# Patient Record
Sex: Female | Born: 1970
Health system: Southern US, Community
[De-identification: ages and names within clinical notes are randomized; demographics above are authoritative.]

## PROBLEM LIST (undated history)

## (undated) DIAGNOSIS — L409 Psoriasis, unspecified: Secondary | ICD-10-CM

## (undated) DIAGNOSIS — Z87440 Personal history of urinary (tract) infections: Secondary | ICD-10-CM

## (undated) DIAGNOSIS — T7840XA Allergy, unspecified, initial encounter: Secondary | ICD-10-CM

## (undated) DIAGNOSIS — E669 Obesity, unspecified: Secondary | ICD-10-CM

## (undated) DIAGNOSIS — B353 Tinea pedis: Secondary | ICD-10-CM

## (undated) DIAGNOSIS — J40 Bronchitis, not specified as acute or chronic: Secondary | ICD-10-CM

## (undated) DIAGNOSIS — B019 Varicella without complication: Secondary | ICD-10-CM

## (undated) DIAGNOSIS — F32A Depression, unspecified: Secondary | ICD-10-CM

## (undated) DIAGNOSIS — F419 Anxiety disorder, unspecified: Secondary | ICD-10-CM

## (undated) DIAGNOSIS — R87619 Unspecified abnormal cytological findings in specimens from cervix uteri: Secondary | ICD-10-CM

## (undated) DIAGNOSIS — I10 Essential (primary) hypertension: Secondary | ICD-10-CM

## (undated) DIAGNOSIS — R002 Palpitations: Secondary | ICD-10-CM

## (undated) DIAGNOSIS — M7712 Lateral epicondylitis, left elbow: Secondary | ICD-10-CM

## (undated) DIAGNOSIS — N920 Excessive and frequent menstruation with regular cycle: Secondary | ICD-10-CM

## (undated) DIAGNOSIS — E876 Hypokalemia: Secondary | ICD-10-CM

## (undated) DIAGNOSIS — K219 Gastro-esophageal reflux disease without esophagitis: Secondary | ICD-10-CM

## (undated) DIAGNOSIS — D219 Benign neoplasm of connective and other soft tissue, unspecified: Secondary | ICD-10-CM

## (undated) DIAGNOSIS — B029 Zoster without complications: Secondary | ICD-10-CM

## (undated) DIAGNOSIS — B1910 Unspecified viral hepatitis B without hepatic coma: Secondary | ICD-10-CM

## (undated) DIAGNOSIS — S46911A Strain of unspecified muscle, fascia and tendon at shoulder and upper arm level, right arm, initial encounter: Secondary | ICD-10-CM

## (undated) HISTORY — DX: Anxiety disorder, unspecified: F41.9

## (undated) HISTORY — DX: Unspecified viral hepatitis B without hepatic coma: B19.10

## (undated) HISTORY — DX: Varicella without complication: B01.9

## (undated) HISTORY — DX: Gastro-esophageal reflux disease without esophagitis: K21.9

## (undated) HISTORY — DX: Bronchitis, not specified as acute or chronic: J40

## (undated) HISTORY — DX: Unspecified abnormal cytological findings in specimens from cervix uteri: R87.619

## (undated) HISTORY — DX: Psoriasis, unspecified: L40.9

## (undated) HISTORY — DX: Tinea pedis: B35.3

## (undated) HISTORY — DX: Hypokalemia: E87.6

## (undated) HISTORY — PX: EYE SURGERY: SHX253

## (undated) HISTORY — DX: Allergy, unspecified, initial encounter: T78.40XA

## (undated) HISTORY — DX: Personal history of urinary (tract) infections: Z87.440

## (undated) HISTORY — DX: Obesity, unspecified: E66.9

## (undated) HISTORY — PX: INTRAUTERINE DEVICE (IUD) INSERTION: SHX5877

## (undated) HISTORY — DX: Essential (primary) hypertension: I10

## (undated) HISTORY — DX: Strain of unspecified muscle, fascia and tendon at shoulder and upper arm level, right arm, initial encounter: S46.911A

## (undated) HISTORY — DX: Benign neoplasm of connective and other soft tissue, unspecified: D21.9

## (undated) HISTORY — DX: Palpitations: R00.2

## (undated) HISTORY — DX: Excessive and frequent menstruation with regular cycle: N92.0

## (undated) HISTORY — DX: Lateral epicondylitis, left elbow: M77.12

---

## 2001-06-13 HISTORY — PX: DILATION AND CURETTAGE OF UTERUS: SHX78

## 2008-06-04 ENCOUNTER — Other Ambulatory Visit: Admission: RE | Admit: 2008-06-04 | Discharge: 2008-06-04 | Payer: Self-pay | Admitting: Obstetrics and Gynecology

## 2008-06-13 LAB — HM MAMMOGRAPHY

## 2008-06-20 ENCOUNTER — Encounter: Admission: RE | Admit: 2008-06-20 | Discharge: 2008-06-20 | Payer: Self-pay | Admitting: Obstetrics & Gynecology

## 2008-08-14 ENCOUNTER — Encounter: Payer: Self-pay | Admitting: Cardiovascular Disease

## 2008-08-14 ENCOUNTER — Ambulatory Visit: Payer: Self-pay | Admitting: Cardiovascular Disease

## 2008-08-14 DIAGNOSIS — I2699 Other pulmonary embolism without acute cor pulmonale: Secondary | ICD-10-CM | POA: Insufficient documentation

## 2008-08-14 DIAGNOSIS — R0789 Other chest pain: Secondary | ICD-10-CM | POA: Insufficient documentation

## 2008-08-14 DIAGNOSIS — K219 Gastro-esophageal reflux disease without esophagitis: Secondary | ICD-10-CM | POA: Insufficient documentation

## 2009-12-12 ENCOUNTER — Other Ambulatory Visit: Payer: Self-pay | Admitting: Emergency Medicine

## 2009-12-13 ENCOUNTER — Emergency Department (HOSPITAL_COMMUNITY): Admission: EM | Admit: 2009-12-13 | Discharge: 2009-12-13 | Payer: Self-pay | Admitting: Emergency Medicine

## 2009-12-13 ENCOUNTER — Other Ambulatory Visit: Payer: Self-pay | Admitting: Emergency Medicine

## 2010-08-12 LAB — HM PAP SMEAR

## 2010-08-29 LAB — BASIC METABOLIC PANEL
BUN: 16 mg/dL (ref 6–23)
CO2: 24 mEq/L (ref 19–32)
Chloride: 110 mEq/L (ref 96–112)
Glucose, Bld: 95 mg/dL (ref 70–99)
Potassium: 3.5 mEq/L (ref 3.5–5.1)

## 2010-08-29 LAB — CBC
HCT: 40.4 % (ref 36.0–46.0)
MCH: 29.4 pg (ref 26.0–34.0)
MCHC: 33.6 g/dL (ref 30.0–36.0)
MCV: 87.6 fL (ref 78.0–100.0)
RDW: 12.3 % (ref 11.5–15.5)
WBC: 10.1 10*3/uL (ref 4.0–10.5)

## 2010-08-29 LAB — POCT CARDIAC MARKERS: CKMB, poc: <1 ng/mL — ABNORMAL LOW (ref 1.0–8.0)

## 2010-09-14 ENCOUNTER — Other Ambulatory Visit: Payer: Self-pay | Admitting: Obstetrics and Gynecology

## 2010-09-14 DIAGNOSIS — Z1231 Encounter for screening mammogram for malignant neoplasm of breast: Secondary | ICD-10-CM

## 2010-09-17 ENCOUNTER — Ambulatory Visit: Payer: Self-pay

## 2011-02-01 ENCOUNTER — Ambulatory Visit (INDEPENDENT_AMBULATORY_CARE_PROVIDER_SITE_OTHER): Payer: Self-pay | Admitting: Family Medicine

## 2011-02-01 ENCOUNTER — Encounter: Payer: Self-pay | Admitting: Family Medicine

## 2011-02-01 VITALS — BP 111/76 | HR 67 | Temp 98.4°F | Ht 62.0 in | Wt 213.8 lb

## 2011-02-01 DIAGNOSIS — R87619 Unspecified abnormal cytological findings in specimens from cervix uteri: Secondary | ICD-10-CM

## 2011-02-01 DIAGNOSIS — R82998 Other abnormal findings in urine: Secondary | ICD-10-CM

## 2011-02-01 DIAGNOSIS — L408 Other psoriasis: Secondary | ICD-10-CM

## 2011-02-01 DIAGNOSIS — L409 Psoriasis, unspecified: Secondary | ICD-10-CM

## 2011-02-01 DIAGNOSIS — Z87440 Personal history of urinary (tract) infections: Secondary | ICD-10-CM

## 2011-02-01 DIAGNOSIS — B182 Chronic viral hepatitis C: Secondary | ICD-10-CM

## 2011-02-01 DIAGNOSIS — I10 Essential (primary) hypertension: Secondary | ICD-10-CM

## 2011-02-01 DIAGNOSIS — R300 Dysuria: Secondary | ICD-10-CM

## 2011-02-01 DIAGNOSIS — K219 Gastro-esophageal reflux disease without esophagitis: Secondary | ICD-10-CM

## 2011-02-01 DIAGNOSIS — K759 Inflammatory liver disease, unspecified: Secondary | ICD-10-CM

## 2011-02-01 DIAGNOSIS — B181 Chronic viral hepatitis B without delta-agent: Secondary | ICD-10-CM

## 2011-02-01 LAB — POCT URINALYSIS DIPSTICK
Bilirubin, UA: NEGATIVE
Glucose, UA: NEGATIVE
Nitrite, UA: NEGATIVE
Spec Grav, UA: 1.015
Urobilinogen, UA: 0.2

## 2011-02-01 MED ORDER — NEBIVOLOL HCL 5 MG PO TABS: 5.0000 mg | ORAL_TABLET | Freq: Every day | ORAL | Status: DC

## 2011-02-01 MED ORDER — HALOBETASOL PROPIONATE 0.05 % EX OINT: TOPICAL_OINTMENT | Freq: Every day | CUTANEOUS | Status: DC | PRN

## 2011-02-01 NOTE — Patient Instructions (Signed)

## 2011-02-02 LAB — HEPATITIS PANEL, ACUTE: Hepatitis B Surface Ag: POSITIVE — AB

## 2011-02-04 MED ORDER — AMOXICILLIN-POT CLAVULANATE 875-125 MG PO TABS: 1.0000 | ORAL_TABLET | Freq: Two times a day (BID) | ORAL | Status: DC

## 2011-02-07 ENCOUNTER — Encounter: Payer: Self-pay | Admitting: Family Medicine

## 2011-02-07 DIAGNOSIS — I10 Essential (primary) hypertension: Secondary | ICD-10-CM | POA: Insufficient documentation

## 2011-02-07 DIAGNOSIS — Z87440 Personal history of urinary (tract) infections: Secondary | ICD-10-CM

## 2011-02-07 DIAGNOSIS — R87619 Unspecified abnormal cytological findings in specimens from cervix uteri: Secondary | ICD-10-CM | POA: Insufficient documentation

## 2011-02-07 DIAGNOSIS — B182 Chronic viral hepatitis C: Secondary | ICD-10-CM | POA: Insufficient documentation

## 2011-02-07 DIAGNOSIS — B181 Chronic viral hepatitis B without delta-agent: Secondary | ICD-10-CM

## 2011-02-07 HISTORY — DX: Unspecified abnormal cytological findings in specimens from cervix uteri: R87.619

## 2011-02-07 HISTORY — DX: Personal history of urinary (tract) infections: Z87.440

## 2011-02-07 HISTORY — DX: Chronic viral hepatitis B without delta-agent: B18.1

## 2011-02-07 NOTE — Assessment & Plan Note (Addendum)
First 9 roughly 3 months ago. Has been having some burning off and on since then. Will monitor with intermittent urinalysis and the based on symptoms. Urine culture shows strep infection, treated with Augmentin

## 2011-02-07 NOTE — Assessment & Plan Note (Signed)
No significant symptoms at this time, avoid offending foods and may use Ranitidine or Tums prn

## 2011-02-07 NOTE — Assessment & Plan Note (Signed)
Patient will maintain annual paps for now. Patient notes stopped Nuvaring in March 2012

## 2011-02-07 NOTE — Assessment & Plan Note (Signed)
Treat with HCTZ and Bystolic

## 2011-02-07 NOTE — Progress Notes (Signed)
Brittany Johnson [id number] [birth year] 02/07/2011      Progress Note New Patient  Subjective  Chief Complaint  Chief Complaint  Patient presents with  . Establish Care    new patient  . Urinary Tract Infection    UTI 3 months ago and has burning every now and then with urination    HPI  , Caucasian female who is in to establish care. She notes about 3 months ago she had her first urinary tract infection and since then has been having trouble with intermittent burning and frequency. Notes recent difficulties with some burning have recurred. Denies fevers, chills, abdominal pain, back pain, chest pain, palpitations, shortness of breath, headache, congestion, GI complaints. She believe she is a hepatitis B carrier but is unclear which type of hepatitis. She notes she was treated while she was pregnant with her 2 children. She is G4 P2 status post 2 spontaneous vaginal deliveries one miscarriage and a blighted ova. Reports a history of hypertension and some renal insufficiency in the past but notes these have improved. Stopped Nuvering in March 2012. He saw Dr. Leota Sauers for her GYN care and as noted distant history of an abnormal Pap. Lives with her husband and 2 young children.  Past Medical History  Diagnosis Date  . Hypertension   . Chicken pox as a child  . Hepatitis C carrier 02/07/2011  . Hx: UTI (urinary tract infection) 02/07/2011  . Abnormal cervical cytology 02/07/2011  . HTN (hypertension) 02/07/2011    Past Surgical History  Procedure Date  . Dilation and curettage of uterus 2003    Family History  Problem Relation Age of Onset  . Diabetes Mother     type 2  . Heart attack Father   . Hypertension Father   . Cancer Paternal Grandmother     ?    History   Social History  . Marital Status: Married    Spouse Name: N/A    Number of Children: N/A  . Years of Education: N/A   Occupational History  . Not on file.   Social History Main Topics  . Smoking  status: Never Smoker   . Smokeless tobacco: Never Used  . Alcohol Use: No     occasionaly- once every 4 months  . Drug Use: No  . Sexually Active: Yes -- Female partner(s)   Other Topics Concern  . Not on file   Social History Narrative  . No narrative on file    No current outpatient prescriptions on file prior to visit.    No Known Allergies  Review of Systems  Review of Systems  Constitutional: Negative for fever, chills and malaise/fatigue.  HENT: Negative for hearing loss, nosebleeds and congestion.   Eyes: Negative for discharge.  Respiratory: Negative for cough, sputum production, shortness of breath and wheezing.   Cardiovascular: Negative for chest pain, palpitations and leg swelling.  Gastrointestinal: Negative for heartburn, nausea, vomiting, abdominal pain, diarrhea, constipation and blood in stool.  Genitourinary: Positive for dysuria and frequency. Negative for urgency, hematuria and flank pain.  Musculoskeletal: Negative for myalgias, back pain and falls.  Skin: Negative for rash.  Neurological: Negative for dizziness, tremors, sensory change, focal weakness, loss of consciousness, weakness and headaches.  Endo/Heme/Allergies: Negative for polydipsia. Does not bruise/bleed easily.  Psychiatric/Behavioral: Negative for depression and suicidal ideas. The patient is not nervous/anxious and does not have insomnia.     Objective  BP 111/76  Pulse 67  Temp(Src) 98.4 F (36.9 C) (Oral)  Ht 5\' 2"  (1.575 m)  Wt 213 lb 12.8 oz (96.979 kg)  BMI 39.10 kg/m2  SpO2 97%  LMP 01/18/2011  Physical Exam  Physical Exam  Constitutional: She is oriented to person, place, and time and well-developed, well-nourished, and in no distress. No distress.  HENT:  Head: Normocephalic and atraumatic.  Right Ear: External ear normal.  Left Ear: External ear normal.  Nose: Nose normal.  Mouth/Throat: Oropharynx is clear and moist. No oropharyngeal exudate.  Eyes: Conjunctivae  are normal. Pupils are equal, round, and reactive to light. Right eye exhibits no discharge. Left eye exhibits no discharge. No scleral icterus.  Neck: Normal range of motion. Neck supple. No thyromegaly present.  Cardiovascular: Normal rate, regular rhythm, normal heart sounds and intact distal pulses.   No murmur heard. Pulmonary/Chest: Effort normal and breath sounds normal. No respiratory distress. She has no wheezes. She has no rales.  Abdominal: Soft. Bowel sounds are normal. She exhibits no distension and no mass. There is no tenderness.  Musculoskeletal: Normal range of motion. She exhibits no edema and no tenderness.  Lymphadenopathy:    She has no cervical adenopathy.  Neurological: She is alert and oriented to person, place, and time. She has normal reflexes. No cranial nerve deficit. Coordination normal.  Skin: Skin is warm and dry. No rash noted. She is not diaphoretic.  Psychiatric: Mood, memory and affect normal.       Assessment & Plan  Hepatitis B carrier Patient unclear which Hepatitis she was the carrier of labs done at visit confirm Hep B carrier, will monitor  Hx: UTI (urinary tract infection) First 9 roughly 3 months ago. Has been having some burning off and on since then. Will monitor with intermittent urinalysis and the based on symptoms. Urine culture shows strep infection, treated with Augmentin  GERD No significant symptoms at this time, avoid offending foods and may use Ranitidine or Tums prn  Abnormal cervical cytology Patient will maintain annual paps for now. Patient notes stopped Nuvaring in March 2012  HTN (hypertension) Treat with HCTZ and Bystolic

## 2011-02-07 NOTE — Assessment & Plan Note (Signed)
Patient unclear which Hepatitis she was the carrier of labs done at visit confirm Hep B carrier, will monitor

## 2011-02-25 ENCOUNTER — Encounter: Payer: Self-pay | Admitting: Family Medicine

## 2011-02-25 ENCOUNTER — Ambulatory Visit: Payer: Self-pay

## 2011-02-25 ENCOUNTER — Ambulatory Visit (INDEPENDENT_AMBULATORY_CARE_PROVIDER_SITE_OTHER): Payer: BLUE CROSS/BLUE SHIELD | Admitting: Family Medicine

## 2011-02-25 DIAGNOSIS — T2210XA Burn of first degree of shoulder and upper limb, except wrist and hand, unspecified site, initial encounter: Secondary | ICD-10-CM

## 2011-02-25 DIAGNOSIS — I10 Essential (primary) hypertension: Secondary | ICD-10-CM

## 2011-02-25 NOTE — Patient Instructions (Signed)
Burn Care Instructions Your skin is a natural barrier to infection. It is the largest organ of your body. Because of your burn, this natural protection has been damaged. To help prevent infection, it is very important to follow these instructions in the care of your burn. BURNS ARE CLASSIFIED AS:  First degree - only erythema or redness of skin. No scarring expected.   Second degree - blistering of skin. No scarring expected.   Third degree - destruction of all layers of skin, scarring expected. Depending on size it may require grafting.  HOME CARE INSTRUCTIONS  Wash hands well before changing your bandage.   Change your bandage  times per day or as instructed by your caregiver.   Remove old bandage. (If bandage sticks to burn you may soak it off with cool, clean water).   Cleanse thoroughly but gently with mild soap and water.   Pat dry with a clean, dry cloth.   Apply a thin layer of anti-bacterial (germ) cream to the burn.   Apply clean bandages as shown to you in the Emergency Department or by your caregiver.   Keep dressing as clean and dry as possible.   Elevate affected area (such as hand or foot) for the first 24 hours, then as instructed by caregiver.   Only take over-the-counter or prescription medicines for pain, discomfort, or fever as directed by your caregiver.  SEEK IMMEDIATE MEDICAL CARE IF:  You develop excessive pain.   The burned area develops redness, tenderness, swelling, or red streaks near the burn.   The burned area develops pus or a foul odor.   You develop an oral temperature above 103 MAKE SURE YOU:   Understand these instructions.   Will watch your condition.   Will get help right away if you are not doing well or get worse.  Document Released: 05/30/2005 Document Re-Released: 11/17/2009 [facility] Patient Information 2011 [place], [place].  Consider Bacitracin ointment for any burns infuture, apply at bed for next 2 days and then as  needed  Use [facility] Astringent as needed for any skin irritation, acne, bug bites etc

## 2011-02-28 DIAGNOSIS — T2210XA Burn of first degree of shoulder and upper limb, except wrist and hand, unspecified site, initial encounter: Secondary | ICD-10-CM | POA: Insufficient documentation

## 2011-02-28 NOTE — Assessment & Plan Note (Signed)
Patient noted some redness, swelling and discomfort around wound and in left axillae last night but all symptoms are improved today.  No f/c/malaise. Encouraged her to continue cleansing it daily with warm soap and watewr daily and may cover with bacitracin prn if redness or swelling return

## 2011-02-28 NOTE — Progress Notes (Signed)
Brittany Johnson [id number] January 18, 1971 02/28/2011      Progress Note-Follow Up  Subjective  Chief Complaint  Chief Complaint  Patient presents with  . Burn    left arm X 6 days    HPI  Patient is a 40 year old Caucasian female who is here today to have a burn on her left arm evaluated. She actually burned it several days ago and it was improving until last night when he became somewhat red and inflamed around the site. Took some mild increasing tenderness and discomfort in that axilla as well. No blistering of skin or sloughing appeared. No f/c/malaise and today it feels better, she has just been cleaning it daily with warm water and mild soap. Has tried some Neosporin prn. No other c/o today. No CP/palp/SPB/GI or GU c/o.  Past Medical History  Diagnosis Date  . Hypertension   . Chicken pox as a child  . Hepatitis C carrier 02/07/2011  . Hx: UTI (urinary tract infection) 02/07/2011  . Abnormal cervical cytology 02/07/2011  . HTN (hypertension) 02/07/2011    Past Surgical History  Procedure Date  . Dilation and curettage of uterus 2003    Family History  Problem Relation Age of Onset  . Diabetes Mother     type 2  . Heart attack Father   . Hypertension Father   . Cancer Paternal Grandmother     ?    History   Social History  . Marital Status: Married    Spouse Name: N/A    Number of Children: N/A  . Years of Education: N/A   Occupational History  . Not on file.   Social History Main Topics  . Smoking status: Never Smoker   . Smokeless tobacco: Never Used  . Alcohol Use: No     occasionaly- once every 4 months  . Drug Use: No  . Sexually Active: Yes -- Female partner(s)   Other Topics Concern  . Not on file   Social History Narrative  . No narrative on file    Current Outpatient Prescriptions on File Prior to Visit  Medication Sig Dispense Refill  . halobetasol (ULTRAVATE) 0.05 % ointment Apply topically daily as needed. rash  50 g  0  .  hydrochlorothiazide (,MICROZIDE/HYDRODIURIL,) 12.5 MG capsule Take 12.5 mg by mouth daily.        . nebivolol (BYSTOLIC) 5 MG tablet Take 1 tablet (5 mg total) by mouth daily.  35 tablet  0    No Known Allergies  Review of Systems  Review of Systems  Constitutional: Negative for fever and malaise/fatigue.  HENT: Negative for congestion.   Eyes: Negative for discharge.  Respiratory: Negative for shortness of breath.   Cardiovascular: Negative for chest pain, palpitations and leg swelling.  Gastrointestinal: Negative for nausea, abdominal pain and diarrhea.  Genitourinary: Negative for dysuria.  Musculoskeletal: Negative for falls.  Skin: Negative for rash.       Small burn LUE, no blistering  Neurological: Negative for loss of consciousness and headaches.  Endo/Heme/Allergies: Negative for polydipsia.  Psychiatric/Behavioral: Negative for depression and suicidal ideas. The patient is not nervous/anxious and does not have insomnia.     Objective  BP 121/77  Pulse 75  Temp(Src) 98.1 F (36.7 C) (Oral)  Ht 5\' 2"  (1.575 m)  Wt 214 lb 12.8 oz (97.433 kg)  BMI 39.29 kg/m2  SpO2 99%  LMP 02/18/2011  Physical Exam  Physical Exam  Constitutional: She is oriented to person, place, and time and well-developed, well-nourished,  and in no distress. No distress.  HENT:  Head: Normocephalic and atraumatic.  Eyes: Conjunctivae are normal.  Neck: Neck supple. No thyromegaly present.  Cardiovascular: Normal rate, regular rhythm and normal heart sounds.   No murmur heard. Pulmonary/Chest: Effort normal and breath sounds normal. She has no wheezes.  Abdominal: She exhibits no distension and no mass.  Musculoskeletal: She exhibits no edema.  Lymphadenopathy:    She has no cervical adenopathy.  Neurological: She is alert and oriented to person, place, and time.  Skin: Skin is warm and dry. No rash noted. She is not diaphoretic.       3/4 in x 2 in, burn, mildly erythematous, no  surrounding fluctuance, nontender, no pus or drainage on LUE near wrist  Psychiatric: Memory, affect and judgment normal.    Lab Results  Component Value Date   TSH 1.78 08/14/2008   Lab Results  Component Value Date   WBC 10.1 12/12/2009   HGB 13.6 12/12/2009   HCT 40.4 12/12/2009   MCV 87.6 12/12/2009   PLT 223 12/12/2009   Lab Results  Component Value Date   CREATININE 1.0 12/12/2009   BUN 16 12/12/2009   NA 143 12/12/2009   K 3.5 12/12/2009   CL 110 12/12/2009   CO2 24 12/12/2009      Assessment & Plan  Burn of arm, first degree Patient noted some redness, swelling and discomfort around wound and in left axillae last night but all symptoms are improved today.  No f/c/malaise. Encouraged her to continue cleansing it daily with warm soap and watewr daily and may cover with bacitracin prn if redness or swelling return  HTN (hypertension) Well controlled at today's visit, no changes

## 2011-02-28 NOTE — Assessment & Plan Note (Signed)
Well controlled at today's visit, no changes 

## 2011-03-04 ENCOUNTER — Encounter: Payer: Self-pay | Admitting: Family Medicine

## 2011-03-04 ENCOUNTER — Ambulatory Visit (INDEPENDENT_AMBULATORY_CARE_PROVIDER_SITE_OTHER): Payer: BLUE CROSS/BLUE SHIELD | Admitting: Family Medicine

## 2011-03-04 VITALS — BP 116/74 | HR 66 | Temp 98.7°F | Ht 62.0 in | Wt 214.0 lb

## 2011-03-04 DIAGNOSIS — B353 Tinea pedis: Secondary | ICD-10-CM

## 2011-03-04 DIAGNOSIS — Z23 Encounter for immunization: Secondary | ICD-10-CM

## 2011-03-04 DIAGNOSIS — T2210XA Burn of first degree of shoulder and upper limb, except wrist and hand, unspecified site, initial encounter: Secondary | ICD-10-CM

## 2011-03-04 DIAGNOSIS — E669 Obesity, unspecified: Secondary | ICD-10-CM

## 2011-03-04 DIAGNOSIS — K219 Gastro-esophageal reflux disease without esophagitis: Secondary | ICD-10-CM

## 2011-03-04 DIAGNOSIS — I10 Essential (primary) hypertension: Secondary | ICD-10-CM

## 2011-03-04 HISTORY — DX: Obesity, unspecified: E66.9

## 2011-03-04 MED ORDER — HYDROCHLOROTHIAZIDE 12.5 MG PO CAPS: 12.5000 mg | ORAL_CAPSULE | Freq: Every day | ORAL | Status: DC

## 2011-03-04 NOTE — Assessment & Plan Note (Signed)
Resolved

## 2011-03-04 NOTE — Assessment & Plan Note (Signed)
Patient would like to try weaning off bp meds, she forgot to take them several days and her numbers were good. We will stop bystolic and she will monitor bp, if running hi restart, if good she may also put aside hctz a week before her next visit so we can check bp off both. She has the DASH diet outline already to follow.

## 2011-03-04 NOTE — Assessment & Plan Note (Signed)
No c/o

## 2011-03-04 NOTE — Patient Instructions (Addendum)
Tinea Pedis (Athlete's Foot) Your exam shows you have athlete's foot, a fungus infection that usually starts between the toes. This condition is made worse by heat, moisture, and friction. To treat it you should wash your feet 2-3 times daily, drying well especially between the toes. Wear cotton socks to absorb sweat and change them twice a day if possible. You should allow your feet to be open to the air as much as possible and wear sandals when possible. The first step in treatment is prevention:  Do not share towels.   Wear sandals or other foot protection in wet areas such as locker rooms, shared showers and public swimming pools.   Keep your feet dry. Wear shoes that allow air to circulate and use cotton or wool socks.  Many products are available to treat athlete's foot. Most are medications that kill fungus and are available as powders or creams. Some require a prescription, some do not. Your caregiver or pharmacist can make a suggestion. Do not use steroid creams on athlete's foot. SEEK MEDICAL CARE IF:  You develop a temperature with no other apparent cause.   You develop swelling and soreness & redness (inflammation) in your foot.   The infection is spreading. Your foot becomes swollen, hot and red (inflamed).   Treatment is not helping.   Athlete's foot is not better in 7 days or completely cured in 30 days.   You have any problems that may be related to the medicine you are taking.  Document Released: 07/07/2004 Document Re-Released: 05/18/2009 [facility] Patient Information 2011 [place], [place].   Apply Tinactin spray to shoes, try powder to socks and use Lamisil (Terbenafine) gel or cream to foot twice daily.  Stop Bystolic and monitor bp and pulse, want bp < 135/90 at rest and pulse < 90 if numbers run hi, restart Bystolic and call. If bp is doing good and you want to can stop HCTZ 1 week prior to bp check and we will reassess at visit

## 2011-03-04 NOTE — Assessment & Plan Note (Signed)
Given DASH diet and has made an appt at a Bariatric Clinic, increase exercise and check TSH level with labs

## 2011-03-04 NOTE — Progress Notes (Signed)
Brittany Johnson [id number] 29-Jun-1970 03/04/2011      Progress Note-Follow Up  Subjective  Chief Complaint  Chief Complaint  Patient presents with  . Hypertension    1 month follow up  . Ankle Pain    twisted ankle a couple of days ago, swollen  . Eczema    on right foot    HPI  Patient is a 40 year old Caucasian female who is in today for her followup of hypertension. Recently a flu shot today. At her last Tdap in 2008. Recently twisted her left ankle but the pain and swelling are nearly resolved. She is complaining of thickened, dry, flaky skin on the sole of her right foot but not on the foot. No recent illness, fevers, chills, headache, chest pain, palpitations, shortness of breath, GI or GU concerns noted at today's visit  Past Medical History  Diagnosis Date  . Hypertension   . Chicken pox as a child  . Hepatitis C carrier 02/07/2011  . Hx: UTI (urinary tract infection) 02/07/2011  . Abnormal cervical cytology 02/07/2011  . HTN (hypertension) 02/07/2011  . Obese 03/04/2011    Past Surgical History  Procedure Date  . Dilation and curettage of uterus 2003    Family History  Problem Relation Age of Onset  . Diabetes Mother     type 2  . Heart attack Father   . Hypertension Father   . Cancer Paternal Grandmother     ?    History   Social History  . Marital Status: Married    Spouse Name: N/A    Number of Children: N/A  . Years of Education: N/A   Occupational History  . Not on file.   Social History Main Topics  . Smoking status: Never Smoker   . Smokeless tobacco: Never Used  . Alcohol Use: No     occasionaly- once every 4 months  . Drug Use: No  . Sexually Active: Yes -- Female partner(s)   Other Topics Concern  . Not on file   Social History Narrative  . No narrative on file    No current outpatient prescriptions on file prior to visit.    No Known Allergies  Review of Systems  Review of Systems  Constitutional: Negative for fever and  malaise/fatigue.  HENT: Negative for congestion.   Eyes: Negative for discharge.  Respiratory: Negative for shortness of breath.   Cardiovascular: Negative for chest pain, palpitations and leg swelling.  Gastrointestinal: Negative for nausea, abdominal pain and diarrhea.  Genitourinary: Negative for dysuria.  Musculoskeletal: Positive for joint pain. Negative for falls.       Turned left ankle this week but pain and swelling are nearly resolved.   Skin: Negative for rash.       Dry, flaky, thickened skin on base of right foot.  Neurological: Negative for loss of consciousness and headaches.  Endo/Heme/Allergies: Negative for polydipsia.  Psychiatric/Behavioral: Negative for depression and suicidal ideas. The patient is not nervous/anxious and does not have insomnia.     Objective  BP 116/74  Pulse 66  Temp(Src) 98.7 F (37.1 C) (Oral)  Ht 5\' 2"  (1.575 m)  Wt 214 lb (97.07 kg)  BMI 39.14 kg/m2  SpO2 98%  LMP 02/18/2011  Physical Exam  Physical Exam  Constitutional: She is oriented to person, place, and time and well-developed, well-nourished, and in no distress. No distress.  HENT:  Head: Normocephalic and atraumatic.  Eyes: Conjunctivae are normal.  Neck: Neck supple. No thyromegaly present.  Cardiovascular:  Normal rate, regular rhythm and normal heart sounds.   No murmur heard. Pulmonary/Chest: Effort normal and breath sounds normal. She has no wheezes.  Abdominal: She exhibits no distension and no mass.  Musculoskeletal: She exhibits no edema.  Lymphadenopathy:    She has no cervical adenopathy.  Neurological: She is alert and oriented to person, place, and time.  Skin: Skin is warm and dry. No rash noted. She is not diaphoretic.       Skin on sole of right foot, thick, flaky, white, cracked  Psychiatric: Memory, affect and judgment normal.    Lab Results  Component Value Date   TSH 1.78 08/14/2008   Lab Results  Component Value Date   WBC 10.1 12/12/2009   HGB  13.6 12/12/2009   HCT 40.4 12/12/2009   MCV 87.6 12/12/2009   PLT 223 12/12/2009   Lab Results  Component Value Date   CREATININE 1.0 12/12/2009   BUN 16 12/12/2009   NA 143 12/12/2009   K 3.5 12/12/2009   CL 110 12/12/2009   CO2 24 12/12/2009      Assessment & Plan  HTN (hypertension) Patient would like to try weaning off bp meds, she forgot to take them several days and her numbers were good. We will stop bystolic and she will monitor bp, if running hi restart, if good she may also put aside hctz a week before her next visit so we can check bp off both. She has the DASH diet outline already to follow.  Obese Given DASH diet and has made an appt at a Bariatric Clinic, increase exercise and check TSH level with labs  Burn of arm, first degree Resolved  GERD No c/o.  Tinea pedis of right foot Lamisil bid, athelete's foot powder to all socks, spray to all shoes and debride thickened skin

## 2011-03-07 ENCOUNTER — Encounter: Payer: Self-pay | Admitting: Family Medicine

## 2011-03-07 DIAGNOSIS — B353 Tinea pedis: Secondary | ICD-10-CM | POA: Insufficient documentation

## 2011-03-07 HISTORY — DX: Tinea pedis: B35.3

## 2011-03-07 NOTE — Assessment & Plan Note (Signed)
Lamisil bid, athelete's foot powder to all socks, spray to all shoes and debride thickened skin

## 2011-03-25 ENCOUNTER — Inpatient Hospital Stay: Admission: RE | Admit: 2011-03-25 | Payer: Self-pay | Source: Ambulatory Visit

## 2011-04-15 ENCOUNTER — Ambulatory Visit
Admission: RE | Admit: 2011-04-15 | Discharge: 2011-04-15 | Disposition: A | Discharge status: Home / Self Care | Payer: BC Managed Care – PPO | Source: Ambulatory Visit | Attending: Obstetrics and Gynecology | Admitting: Obstetrics and Gynecology

## 2011-04-15 ENCOUNTER — Ambulatory Visit: Payer: Self-pay

## 2011-04-15 DIAGNOSIS — Z1231 Encounter for screening mammogram for malignant neoplasm of breast: Secondary | ICD-10-CM

## 2011-04-22 ENCOUNTER — Ambulatory Visit: Payer: Self-pay

## 2011-05-06 ENCOUNTER — Ambulatory Visit: Payer: BLUE CROSS/BLUE SHIELD | Admitting: Family Medicine

## 2011-06-27 ENCOUNTER — Telehealth: Payer: Self-pay | Admitting: Family Medicine

## 2011-06-27 NOTE — Telephone Encounter (Signed)
Please advise Tamiflu request?

## 2011-06-27 NOTE — Telephone Encounter (Signed)
She can have it if she wants, does not help tremendously. Does have some potential SE, if she would like it is 75 mg po daily x 5 days, disp # 5

## 2011-06-27 NOTE — Telephone Encounter (Signed)
Patient's child has a confirmed case of the flu, he is on Tamiflu, patient would like to know if she can get an Rx?

## 2011-06-28 NOTE — Telephone Encounter (Signed)
Pt advised and states she is feeling ok and daughter is feeling better today so she is not wanting the tamiflu called in.

## 2011-08-05 ENCOUNTER — Other Ambulatory Visit: Payer: Self-pay | Admitting: Family Medicine

## 2011-08-29 ENCOUNTER — Ambulatory Visit: Payer: BC Managed Care – PPO | Admitting: Family Medicine

## 2011-09-02 ENCOUNTER — Encounter: Payer: Self-pay | Admitting: Family Medicine

## 2011-09-02 ENCOUNTER — Ambulatory Visit (INDEPENDENT_AMBULATORY_CARE_PROVIDER_SITE_OTHER): Payer: BC Managed Care – PPO | Admitting: Family Medicine

## 2011-09-02 VITALS — BP 128/83 | HR 86 | Temp 98.6°F | Ht 62.0 in | Wt 195.0 lb

## 2011-09-02 DIAGNOSIS — L0291 Cutaneous abscess, unspecified: Secondary | ICD-10-CM

## 2011-09-02 DIAGNOSIS — Z0000 Encounter for general adult medical examination without abnormal findings: Secondary | ICD-10-CM

## 2011-09-02 DIAGNOSIS — M25559 Pain in unspecified hip: Secondary | ICD-10-CM

## 2011-09-02 DIAGNOSIS — L409 Psoriasis, unspecified: Secondary | ICD-10-CM

## 2011-09-02 DIAGNOSIS — I10 Essential (primary) hypertension: Secondary | ICD-10-CM

## 2011-09-02 DIAGNOSIS — M25551 Pain in right hip: Secondary | ICD-10-CM

## 2011-09-02 DIAGNOSIS — L0390 Cellulitis, unspecified: Secondary | ICD-10-CM

## 2011-09-02 DIAGNOSIS — M7712 Lateral epicondylitis, left elbow: Secondary | ICD-10-CM

## 2011-09-02 DIAGNOSIS — M7710 Lateral epicondylitis, unspecified elbow: Secondary | ICD-10-CM

## 2011-09-02 DIAGNOSIS — L309 Dermatitis, unspecified: Secondary | ICD-10-CM | POA: Insufficient documentation

## 2011-09-02 DIAGNOSIS — L408 Other psoriasis: Secondary | ICD-10-CM

## 2011-09-02 DIAGNOSIS — IMO0002 Reserved for concepts with insufficient information to code with codable children: Secondary | ICD-10-CM

## 2011-09-02 HISTORY — DX: Psoriasis, unspecified: L40.9

## 2011-09-02 HISTORY — DX: Lateral epicondylitis, left elbow: M77.12

## 2011-09-02 LAB — RENAL FUNCTION PANEL
Albumin: 3.9 g/dL (ref 3.5–5.2)
BUN: 10 mg/dL (ref 6–23)
CO2: 27 mEq/L (ref 19–32)
Chloride: 101 mEq/L (ref 96–112)

## 2011-09-02 LAB — CBC
Hemoglobin: 14 g/dL (ref 12.0–15.0)
Platelets: 198 10*3/uL (ref 150.0–400.0)
WBC: 9.8 10*3/uL (ref 4.5–10.5)

## 2011-09-02 LAB — HEPATIC FUNCTION PANEL
AST: 20 U/L (ref 0–37)
Albumin: 3.9 g/dL (ref 3.5–5.2)
Alkaline Phosphatase: 54 U/L (ref 39–117)
Total Protein: 7 g/dL (ref 6.0–8.3)

## 2011-09-02 LAB — TSH: TSH: 2.34 u[IU]/mL (ref 0.35–5.50)

## 2011-09-02 LAB — SEDIMENTATION RATE: Sed Rate: 9 mm/hr (ref 0–22)

## 2011-09-02 MED ORDER — CLOBETASOL PROPIONATE 0.05 % EX CREA: TOPICAL_CREAM | Freq: Two times a day (BID) | CUTANEOUS | Status: AC

## 2011-09-02 MED ORDER — METHYLPREDNISOLONE 4 MG PO KIT: PACK | ORAL | Status: AC

## 2011-09-02 MED ORDER — HYDROCHLOROTHIAZIDE 12.5 MG PO CAPS: 12.5000 mg | ORAL_CAPSULE | ORAL | Status: DC

## 2011-09-02 MED ORDER — MUPIROCIN 2 % EX OINT: TOPICAL_OINTMENT | Freq: Three times a day (TID) | CUTANEOUS | Status: AC

## 2011-09-02 NOTE — Assessment & Plan Note (Signed)
Diagnosed years ago but generally does not cause her any trouble. Previously the responsible with her elbows and knees but at present she has a painful irritated red spot on herleft anterior tibial plateau. She's tried some steroid cream she has at home and unfortunately they have not been particularly helpful. we'll start a Medrol Dosepak and at the end of the week she may try some clobetasol cream as well. In the meantime while she is taking them Medrol Dosepak she will apply some mupirocin topically to treat possible early secondary cellulitis

## 2011-09-02 NOTE — Progress Notes (Signed)
Patient ID: Brittany Johnson, female   DOB: Mar 20, 1971, 41 y.o.   MRN: [id number] Brittany Johnson [id number] 03-Apr-1971 09/02/2011      Progress Note-Follow Up  Subjective  Chief Complaint  Chief Complaint  Patient presents with  . Elbow Pain    left X 4 months  . Hypertension    follow up meds  . Psoriasis    cream not working    HPI  Patient is a 41 year old Caucasian female who is in today for several reasons. She had a couple of her left elbow for several months. He first injured kickboxing and has injured it intermittently since then. It swells at times and ice is helpful. Sometimes wakes her up it's painful enough. Does radiate slightly down and up the arm it probably to the hand or shoulder. He denies any major trauma or falls. Denies any similar history of recurrent symptoms. She is also complaining of recurrence of her psoriasis. Historically she sits up on her elbows or knees but is having trouble with a lesion over her left tibial plateau at this time. It is warm and red at times. It is itchy and irritated she has tried some prescription steroid creams and has been only marginally helpful. She denies any recent illness otherwise. No chest pain, palpitations, GI or GU complaints today  Past Medical History  Diagnosis Date  . Hypertension   . Chicken pox as a child  . Hepatitis C carrier 02/07/2011  . Hx: UTI (urinary tract infection) 02/07/2011  . Abnormal cervical cytology 02/07/2011  . HTN (hypertension) 02/07/2011  . Obese 03/04/2011  . Tinea pedis of right foot 03/07/2011  . Psoriasis 09/02/2011  . Left lateral epicondylitis 09/02/2011    Past Surgical History  Procedure Date  . Dilation and curettage of uterus 2003    Family History  Problem Relation Age of Onset  . Diabetes Mother     type 2  . Heart attack Father   . Hypertension Father   . Cancer Paternal Grandmother     ?    History   Social History  . Marital Status: Married    Spouse Name: N/A   Number of Children: N/A  . Years of Education: N/A   Occupational History  . Not on file.   Social History Main Topics  . Smoking status: Never Smoker   . Smokeless tobacco: Never Used  . Alcohol Use: No     occasionaly- once every 4 months  . Drug Use: No  . Sexually Active: Yes -- Female partner(s)   Other Topics Concern  . Not on file   Social History Narrative  . No narrative on file    No current outpatient prescriptions on file prior to visit.    No Known Allergies  Review of Systems  Review of Systems  Constitutional: Negative for fever and malaise/fatigue.  HENT: Negative for congestion.   Eyes: Negative for discharge.  Respiratory: Negative for shortness of breath.   Cardiovascular: Negative for chest pain, palpitations and leg swelling.  Gastrointestinal: Negative for nausea, abdominal pain and diarrhea.  Genitourinary: Negative for dysuria.  Musculoskeletal: Positive for joint pain. Negative for falls.       Left elbow pain  Skin: Positive for rash.       Left anterior tibial plateau  Neurological: Negative for loss of consciousness and headaches.  Endo/Heme/Allergies: Negative for polydipsia.  Psychiatric/Behavioral: Negative for depression and suicidal ideas. The patient is not nervous/anxious and does not have insomnia.  Objective  BP 128/83  Pulse 86  Temp(Src) 98.6 F (37 C) (Temporal)  Ht 5\' 2"  (1.575 m)  Wt 195 lb (88.451 kg)  BMI 35.67 kg/m2  LMP 08/19/2011  Physical Exam  Physical Exam  Constitutional: She is oriented to person, place, and time and well-developed, well-nourished, and in no distress. No distress.  HENT:  Head: Normocephalic and atraumatic.  Eyes: Conjunctivae are normal.  Neck: Neck supple. No thyromegaly present.  Cardiovascular: Normal rate, regular rhythm and normal heart sounds.   No murmur heard. Pulmonary/Chest: Effort normal and breath sounds normal. She has no wheezes.  Abdominal: She exhibits no  distension and no mass.  Musculoskeletal: She exhibits tenderness. She exhibits no edema.       Mildly tender with palp over left lateral epicondyle. No warmth, swelling or erythema  Lymphadenopathy:    She has no cervical adenopathy.  Neurological: She is alert and oriented to person, place, and time.  Skin: Skin is warm and dry. Rash noted. She is not diaphoretic.       3 cm raised, erythematous irritated lesion on left anterior tibial plateau   Psychiatric: Memory, affect and judgment normal.    Lab Results  Component Value Date   TSH 1.78 08/14/2008   Lab Results  Component Value Date   WBC 10.1 12/12/2009   HGB 13.6 12/12/2009   HCT 40.4 12/12/2009   MCV 87.6 12/12/2009   PLT 223 12/12/2009   Lab Results  Component Value Date   CREATININE 1.0 12/12/2009   BUN 16 12/12/2009   NA 143 12/12/2009   K 3.5 12/12/2009   CL 110 12/12/2009   CO2 24 12/12/2009      Assessment & Plan  HTN (hypertension) Adequately controlled today, no changes  Psoriasis Diagnosed years ago but generally does not cause her any trouble. Previously the responsible with her elbows and knees but at present she has a painful irritated red spot on herleft anterior tibial plateau. She's tried some steroid cream she has at home and unfortunately they have not been particularly helpful. we'll start a Medrol Dosepak and at the end of the week she may try some clobetasol cream as well. In the meantime while she is taking them Medrol Dosepak she will apply some mupirocin topically to treat possible early secondary cellulitis  Left lateral epicondylitis Injured the elbow several months ago she thinks while kickboxing and has re\re triggered a couple times since. He will improve only to worsen again. Ice has been slightly helpful and it has been swollen and painful at times. We'll start Medrol Dosepak and encourage her to apply ice 3 times a day wear a brace and apply Aspercreme if symptoms do not improve she'll be referred to  orthopedics for further intervention.

## 2011-09-02 NOTE — Assessment & Plan Note (Signed)
Adequately controlled today, no changes. 

## 2011-09-02 NOTE — Patient Instructions (Signed)
Sacroiliac Joint Dysfunction The sacroiliac joint connects the lower part of the spine (the sacrum) with the bones of the pelvis. CAUSES  Sometimes, there is no obvious reason for sacroiliac joint dysfunction. Other times, it may occur   During pregnancy.   After injury, such as:   Car accidents.   Sport-related injuries.   Work-related injuries.   Due to one leg being shorter than the other.   Due to other conditions that affect the joints, such as:   Rheumatoid arthritis.   Gout.   Psoriasis.   Joint infection (septic arthritis).  SYMPTOMS  Symptoms may include:  Pain in the:   Lower back.   Buttocks.   Groin.   Thighs and legs.   Difficult sitting, standing, walking, lying, bending or lifting.  DIAGNOSIS  A number of tests may be used to help diagnose the cause of sacroiliac joint dysfunction, including:  Imaging tests to look for other causes of pain, including:   MRI.   CT scan.   Bone scan.   Diagnostic injection: During a special x-ray (called fluoroscopy), a needle is put into the sacroiliac joint. A numbing medicine is injected into the joint. If the pain is improved or stopped, the diagnosis of sacroiliac joint dysfunction is more likely.  TREATMENT  There are a number of types of treatment used for sacroiliac joint dysfunction, including:  Only take over-the-counter or prescription medicines for pain, discomfort, or fever as directed by your caregiver.   Medications to relax muscles.   Rest. Decreasing activity can help cut down on painful muscle spasms and allow the back to heal.   Application of heat or ice to the lower back may improve muscle spasms and soothe pain.   Brace. A special back brace, called a sacroiliac belt, can help support the joint while your back is healing.   Physical therapy can help teach comfortable positions and exercises to strengthen muscles that support the sacroiliac joint.   Cortisone injections. Injections  of steroid medicine into the joint can help decrease swelling and improve pain.   Hyaluronic acid injections. This chemical improves lubrication within the sacroiliac joint, thereby decreasing pain.   Radiofrequency ablation. A special needle is placed into the joint, where it burns away nerves that are carrying pain messages from the joint.   Surgery. Because pain occurs during movement of the joint, screws and plates may be installed in order to limit or prevent joint motion.  HOME CARE INSTRUCTIONS   Take all medications exactly as directed.   Follow instructions regarding both rest and physical activity, to avoid worsening the pain.   Do physical therapy exercises exactly as prescribed.  SEEK IMMEDIATE MEDICAL CARE IF:  You experience increasingly severe pain.   You develop new symptoms, such as numbness or tingling in your legs or feet.   You lose bladder or bowel control.  Document Released: 08/26/2008 Document Revised: 05/19/2011 Document Reviewed: 08/26/2008 [facility] Patient Information 2012 [place], [place].   For elbow after the oral steroids are complete then start Ibuprofen 200mg  tabs 2 tabs po twice daily with food for next week then as needed. Also ice the elbow for 15 minutes 3 x a day and apply an arm band to the lower arm as directed. Apply Aspercreme after the ice as well and call for referral if not improving next week.  For the skin on the leg apply Mupriocin ointment 3 x daily for the next week then can switch to steroid cream again, call for  referral to dermatology if not improving   Start a MEgaRed krill oil cap daily for the back and joints

## 2011-09-02 NOTE — Assessment & Plan Note (Signed)
Injured the elbow several months ago she thinks while kickboxing and has re\re triggered a couple times since. He will improve only to worsen again. Ice has been slightly helpful and it has been swollen and painful at times. We'll start Medrol Dosepak and encourage her to apply ice 3 times a day wear a brace and apply Aspercreme if symptoms do not improve she'll be referred to orthopedics for further intervention.

## 2011-09-08 ENCOUNTER — Encounter: Payer: Self-pay | Admitting: Family Medicine

## 2011-09-12 NOTE — Telephone Encounter (Signed)
Please advise 

## 2011-09-12 NOTE — Telephone Encounter (Signed)
I left a detailed message on patients cell phone stating that we would put a referral in for ortho for patients arm pain. Please advise?

## 2011-09-13 ENCOUNTER — Other Ambulatory Visit: Payer: Self-pay | Admitting: Family Medicine

## 2011-09-13 ENCOUNTER — Encounter: Payer: Self-pay | Admitting: Family Medicine

## 2011-09-13 DIAGNOSIS — M25529 Pain in unspecified elbow: Secondary | ICD-10-CM

## 2012-01-06 ENCOUNTER — Ambulatory Visit: Payer: BC Managed Care – PPO | Admitting: Family Medicine

## 2012-01-06 DIAGNOSIS — Z0289 Encounter for other administrative examinations: Secondary | ICD-10-CM

## 2012-03-26 ENCOUNTER — Ambulatory Visit: Payer: BC Managed Care – PPO | Admitting: Family Medicine

## 2012-03-28 ENCOUNTER — Ambulatory Visit: Payer: BC Managed Care – PPO | Admitting: Family Medicine

## 2012-03-29 ENCOUNTER — Ambulatory Visit (INDEPENDENT_AMBULATORY_CARE_PROVIDER_SITE_OTHER): Payer: BC Managed Care – PPO | Admitting: Family Medicine

## 2012-03-29 ENCOUNTER — Telehealth: Payer: Self-pay | Admitting: Family Medicine

## 2012-03-29 ENCOUNTER — Encounter: Payer: Self-pay | Admitting: Family Medicine

## 2012-03-29 VITALS — BP 158/94 | HR 83 | Temp 98.1°F | Ht 62.0 in | Wt 199.0 lb

## 2012-03-29 DIAGNOSIS — IMO0002 Reserved for concepts with insufficient information to code with codable children: Secondary | ICD-10-CM

## 2012-03-29 DIAGNOSIS — Z23 Encounter for immunization: Secondary | ICD-10-CM

## 2012-03-29 DIAGNOSIS — L409 Psoriasis, unspecified: Secondary | ICD-10-CM

## 2012-03-29 DIAGNOSIS — L408 Other psoriasis: Secondary | ICD-10-CM

## 2012-03-29 DIAGNOSIS — M7710 Lateral epicondylitis, unspecified elbow: Secondary | ICD-10-CM

## 2012-03-29 DIAGNOSIS — S46911A Strain of unspecified muscle, fascia and tendon at shoulder and upper arm level, right arm, initial encounter: Secondary | ICD-10-CM

## 2012-03-29 DIAGNOSIS — M7712 Lateral epicondylitis, left elbow: Secondary | ICD-10-CM

## 2012-03-29 DIAGNOSIS — I10 Essential (primary) hypertension: Secondary | ICD-10-CM

## 2012-03-29 HISTORY — DX: Strain of unspecified muscle, fascia and tendon at shoulder and upper arm level, right arm, initial encounter: S46.911A

## 2012-03-29 LAB — TSH: TSH: 2.3 u[IU]/mL (ref 0.35–5.50)

## 2012-03-29 MED ORDER — HALOBETASOL PROPIONATE 0.05 % EX OINT: TOPICAL_OINTMENT | Freq: Every day | CUTANEOUS | Status: DC | PRN

## 2012-03-29 MED ORDER — HYDROCHLOROTHIAZIDE 12.5 MG PO CAPS: 12.5000 mg | ORAL_CAPSULE | ORAL | Status: DC

## 2012-03-29 NOTE — Assessment & Plan Note (Signed)
Improved after physical therapy

## 2012-03-29 NOTE — Telephone Encounter (Signed)
Patient states she is out of her blood pressure medicine & only one of her bp meds was at the pharmacy. She said there should have been 4 Rx's, 2 creams and 2 bp meds. Please contact patient.

## 2012-03-29 NOTE — Assessment & Plan Note (Addendum)
Anterior shoulder pain without any history of trauma. Likely a bursitis or low grade tendonitis. Will start on Transdermal topical cream for bursitis to use 3-4 x a day and can use Aleve once to twice daily, if no improvement then consider PT vs ortho referral

## 2012-03-29 NOTE — Assessment & Plan Note (Signed)
Patient agrees to attempt DASH diet and restart HCTZ 12.5 mg daily.

## 2012-03-29 NOTE — Telephone Encounter (Signed)
THX!

## 2012-03-29 NOTE — Assessment & Plan Note (Signed)
Patient given refill on Ultravate which she feels works better than Clobetasol

## 2012-03-29 NOTE — Progress Notes (Signed)
Patient ID: Brittany Johnson, female   DOB: [birth year], 41 y.o.   MRN: [id number] Brittany Johnson [id number] 1971/05/23 03/29/2012      Progress Note-Follow Up  Subjective  Chief Complaint  Chief Complaint  Patient presents with  . Shoulder Injury    right- X 4 weeks- playing tennis  . Medication Refill  . Injections    flu    HPI  Caucasian female who is in today with a couple of concerns. She stopped her hydrochlorothiazide a couple months ago when she was at the OB/GYN for her systolic blood pressure was 105. She had felt she was fatigued but since stopping the medicine she has not felt any less fatigued. She continues to struggle with fatigue. She is frustrated with her weight cancer playing tennis about 4 weeks ago but unfortunately is right-handed and has now developed some right shoulder pain. It hurts worse with internal and external rotation and hurts badly when she lies on it I she's tried some Aleve with minimal improvement. No numbness, tingling into the rest of the arm are noted. Does have decreased range of motion secondary to pain. No recent illness, fevers, chest pain, palpitations, shortness of breath, GI or GU complaints otherwise. Psoriasis has recently worsened  Past Medical History  Diagnosis Date  . Hypertension   . Chicken pox as a child  . Hepatitis C carrier 02/07/2011  . Hx: UTI (urinary tract infection) 02/07/2011  . Abnormal cervical cytology 02/07/2011  . HTN (hypertension) 02/07/2011  . Obese 03/04/2011  . Tinea pedis of right foot 03/07/2011  . Psoriasis 09/02/2011  . Left lateral epicondylitis 09/02/2011  . Right shoulder strain 03/29/2012    Past Surgical History  Procedure Date  . Dilation and curettage of uterus 2003    Family History  Problem Relation Age of Onset  . Diabetes Mother     type 2  . Heart attack Father   . Hypertension Father   . Cancer Paternal Grandmother     ?    History   Social History  . Marital Status: Married    Spouse Name: N/A    Number of Children: N/A  . Years of Education: N/A   Occupational History  . Not on file.   Social History Main Topics  . Smoking status: Never Smoker   . Smokeless tobacco: Never Used  . Alcohol Use: No     occasionaly- once every 4 months  . Drug Use: No  . Sexually Active: Yes -- Female partner(s)   Other Topics Concern  . Not on file   Social History Narrative  . No narrative on file    Current Outpatient Prescriptions on File Prior to Visit  Medication Sig Dispense Refill  . CAMILA 0.35 MG tablet Take 1 tablet by mouth Daily.      . clobetasol cream (TEMOVATE) 0.05 % Apply topically 2 (two) times daily.  30 g  1  . DISCONTD: hydrochlorothiazide (MICROZIDE) 12.5 MG capsule Take 1 capsule (12.5 mg total) by mouth every morning.  30 capsule  3    No Known Allergies  Review of Systems  Review of Systems  Constitutional: Positive for malaise/fatigue. Negative for fever.  HENT: Negative for congestion.   Eyes: Negative for discharge.  Respiratory: Negative for shortness of breath.   Cardiovascular: Negative for chest pain, palpitations and leg swelling.  Gastrointestinal: Negative for nausea, abdominal pain and diarrhea.  Genitourinary: Negative for dysuria.  Musculoskeletal: Positive for joint pain. Negative for falls.  Right shoulder pain worse with rotation  Skin: Negative for rash.  Neurological: Negative for loss of consciousness and headaches.  Endo/Heme/Allergies: Negative for polydipsia.  Psychiatric/Behavioral: Negative for depression and suicidal ideas. The patient is not nervous/anxious and does not have insomnia.     Objective  BP 158/94  Pulse 83  Temp 98.1 F (36.7 C) (Temporal)  Ht 5\' 2"  (1.575 m)  Wt 199 lb (90.266 kg)  BMI 36.40 kg/m2  SpO2 98%  LMP 03/24/2012  Physical Exam  Physical Exam  Constitutional: She is oriented to person, place, and time and well-developed, well-nourished, and in no distress. No  distress.  HENT:  Head: Normocephalic and atraumatic.  Eyes: Conjunctivae normal are normal.  Neck: Neck supple. No thyromegaly present.  Cardiovascular: Normal rate, regular rhythm and normal heart sounds.   No murmur heard. Pulmonary/Chest: Effort normal and breath sounds normal. She has no wheezes.  Abdominal: Soft. Bowel sounds are normal. She exhibits no distension and no mass.  Musculoskeletal: She exhibits tenderness. She exhibits no edema.       Pain at anterior right shoulder with palpation and internal and external rotation in shoulder. Decreased rom , only raises the arm to about 90 degrees with increased pain, full grip strength  Lymphadenopathy:    She has no cervical adenopathy.  Neurological: She is alert and oriented to person, place, and time.  Skin: Skin is warm and dry. No rash noted. She is not diaphoretic.  Psychiatric: Memory, affect and judgment normal.    Lab Results  Component Value Date   TSH 2.30 03/29/2012   Lab Results  Component Value Date   WBC 9.8 09/02/2011   HGB 14.0 09/02/2011   HCT 41.9 09/02/2011   MCV 86.3 09/02/2011   PLT 198.0 09/02/2011   Lab Results  Component Value Date   CREATININE 0.9 09/02/2011   BUN 10 09/02/2011   NA 136 09/02/2011   K 3.3* 09/02/2011   CL 101 09/02/2011   CO2 27 09/02/2011   Lab Results  Component Value Date   ALT 17 09/02/2011   AST 20 09/02/2011   ALKPHOS 54 09/02/2011   BILITOT 0.5 09/02/2011   Lab Results  Component Value Date   CHOL 143 09/02/2011   Lab Results  Component Value Date   HDL 49.80 09/02/2011   Lab Results  Component Value Date   LDLCALC 79 09/02/2011   Lab Results  Component Value Date   TRIG 72.0 09/02/2011   Lab Results  Component Value Date   CHOLHDL 3 09/02/2011     Assessment & Plan  Psoriasis Patient given refill on Ultravate which she feels works better than Clobetasol  HTN (hypertension) Patient agrees to attempt DASH diet and restart HCTZ 12.5 mg daily.  Left lateral  epicondylitis Improved after physical therapy  Right shoulder strain Anterior shoulder pain without any history of trauma. Likely a bursitis or low grade tendonitis. Will start on Transdermal topical cream for bursitis to use 3-4 x a day and can use Aleve once to twice daily, if no improvement then consider PT vs ortho referral

## 2012-03-29 NOTE — Telephone Encounter (Signed)
Spoke to patient and she says only the HCTZ was at the pharmacy.   Advised that Halobetasol cream was refilled but pharmacy did not receive order.  (set as OTC instead of Normal)  Advised pt I would resend adn she should check with pharmacy to make sure they got RX. Advised pt she will get a phone call from specialty pharmacy regarding cream for shoulder pain.  Pt states she thinks she remembers being told this was coming from a different pharmacy now that I said that. Pt says she was told at visit today to restart BP med, but no med at pharmacy other than HCTZ.  Dr. Abner Greenspan, I see Metoprolol and bystolic on her history, but no mention in her note from today which one you wanted her to restart.  Advised pt I would verify with you and we would send RX to pharmacy if needed and would call her tomorrow to let her know this had been taken care of. She is agreeable with this plan. Please advise BP meds.  Thanks.

## 2012-03-29 NOTE — Patient Instructions (Addendum)
  Aleve 220 mg once to twice daily with food prn pain  Bursitis Bursitis is a swelling and soreness (inflammation) of a fluid-filled sac (bursa) that overlies and protects a joint. It can be caused by injury, overuse of the joint, arthritis or infection. The joints most likely to be affected are the elbows, shoulders, hips and knees. HOME CARE INSTRUCTIONS   Apply ice to the affected area for 15 to 20 minutes each hour while awake for 2 days. Put the ice in a plastic bag and place a towel between the bag of ice and your skin.  Rest the injured joint as much as possible, but continue to put the joint through a full range of motion, 4 times per day. (The shoulder joint especially becomes rapidly "frozen" if not used.) When the pain lessens, begin normal slow movements and usual activities.  Only take over-the-counter or prescription medicines for pain, discomfort or fever as directed by your caregiver.  Your caregiver may recommend draining the bursa and injecting medicine into the bursa. This may help the healing process.  Follow all instructions for follow-up with your caregiver. This includes any orthopedic referrals, physical therapy and rehabilitation. Any delay in obtaining necessary care could result in a delay or failure of the bursitis to heal and chronic pain. SEEK IMMEDIATE MEDICAL CARE IF:   Your pain increases even during treatment.  You develop an oral temperature above 102 F (38.9 C) and have heat and inflammation over the involved bursa. MAKE SURE YOU:   Understand these instructions.  Will watch your condition.  Will get help right away if you are not doing well or get worse. Document Released: 05/27/2000 Document Revised: 08/22/2011 Document Reviewed: 05/01/2009 [facility] Patient Information 2013 [place], [place].

## 2012-04-13 ENCOUNTER — Telehealth: Payer: Self-pay | Admitting: Family Medicine

## 2012-04-13 NOTE — Telephone Encounter (Signed)
Pt informed and informed that MD released to [facility]

## 2012-04-13 NOTE — Telephone Encounter (Signed)
Patient needs her last lab results. Please call

## 2012-05-04 ENCOUNTER — Ambulatory Visit: Payer: BC Managed Care – PPO | Admitting: Family Medicine

## 2012-05-14 ENCOUNTER — Other Ambulatory Visit: Payer: Self-pay | Admitting: Family Medicine

## 2012-05-14 DIAGNOSIS — Z1231 Encounter for screening mammogram for malignant neoplasm of breast: Secondary | ICD-10-CM

## 2012-05-18 ENCOUNTER — Ambulatory Visit: Payer: BC Managed Care – PPO | Admitting: Family Medicine

## 2012-05-25 ENCOUNTER — Encounter: Payer: Self-pay | Admitting: Family Medicine

## 2012-05-25 ENCOUNTER — Ambulatory Visit (INDEPENDENT_AMBULATORY_CARE_PROVIDER_SITE_OTHER): Payer: BC Managed Care – PPO | Admitting: Family Medicine

## 2012-05-25 VITALS — BP 126/85 | HR 75 | Temp 99.6°F | Ht 62.0 in | Wt 206.0 lb

## 2012-05-25 DIAGNOSIS — I10 Essential (primary) hypertension: Secondary | ICD-10-CM

## 2012-05-25 DIAGNOSIS — E876 Hypokalemia: Secondary | ICD-10-CM

## 2012-05-25 DIAGNOSIS — T7840XA Allergy, unspecified, initial encounter: Secondary | ICD-10-CM

## 2012-05-25 DIAGNOSIS — J40 Bronchitis, not specified as acute or chronic: Secondary | ICD-10-CM

## 2012-05-25 DIAGNOSIS — M25519 Pain in unspecified shoulder: Secondary | ICD-10-CM

## 2012-05-25 HISTORY — DX: Hypokalemia: E87.6

## 2012-05-25 HISTORY — DX: Bronchitis, not specified as acute or chronic: J40

## 2012-05-25 LAB — RENAL FUNCTION PANEL
BUN: 11 mg/dL (ref 6–23)
Chloride: 100 mEq/L (ref 96–112)
GFR: 62.94 mL/min (ref >60.00)
Phosphorus: 3 mg/dL (ref 2.3–4.6)
Potassium: 3.3 mEq/L — ABNORMAL LOW (ref 3.5–5.1)

## 2012-05-25 MED ORDER — CETIRIZINE HCL 10 MG PO TABS: 10.0000 mg | ORAL_TABLET | Freq: Every day | ORAL | Status: DC | PRN

## 2012-05-25 MED ORDER — DICLOFENAC SODIUM 3 % TD GEL: TRANSDERMAL | Status: DC

## 2012-05-25 MED ORDER — HYDROCOD POLST-CHLORPHEN POLST 10-8 MG/5ML PO LQCR: 5.0000 mL | Freq: Every evening | ORAL | Status: DC | PRN

## 2012-05-25 MED ORDER — AZITHROMYCIN 250 MG PO TABS: ORAL_TABLET | ORAL | Status: DC

## 2012-05-25 MED ORDER — GUAIFENESIN ER 600 MG PO TB12: 1200.0000 mg | ORAL_TABLET | Freq: Two times a day (BID) | ORAL | Status: DC

## 2012-05-25 NOTE — Patient Instructions (Addendum)

## 2012-05-25 NOTE — Progress Notes (Signed)
Patient ID: Brittany Johnson, female   DOB: [birth year], 41 y.o.   MRN: [id number] Kinsey Karch [id number] 19-Nov-1970 05/25/2012      Progress Note New Patient  Subjective  Chief Complaint  Chief Complaint  Patient presents with  . Follow-up    6 week    HPI  Patient is a 41 year old Caucasian female who is in today for followup. Unfortunately for the last week she's been struggling with congestion and respiratory symptoms. She notes cough productive of health when at times fever is low-grade if at all. Malaise, myalgias are present. His head congestion but more so chest congestion. No ear pain only some mild throat irritation. No GI or GU complaints. No chest pain or palpitations. She has not really tried any medications over-the-counter but her cough is beginning to interrupt her sleep at this time.  Past Medical History  Diagnosis Date  . Hypertension   . Chicken pox as a child  . Hepatitis C carrier 02/07/2011  . Hx: UTI (urinary tract infection) 02/07/2011  . Abnormal cervical cytology 02/07/2011  . HTN (hypertension) 02/07/2011  . Obese 03/04/2011  . Tinea pedis of right foot 03/07/2011  . Psoriasis 09/02/2011  . Left lateral epicondylitis 09/02/2011  . Right shoulder strain 03/29/2012  . Hypokalemia 05/25/2012  . Bronchitis 05/25/2012    Past Surgical History  Procedure Date  . Dilation and curettage of uterus 2003    Family History  Problem Relation Age of Onset  . Diabetes Mother     type 2  . Heart attack Father   . Hypertension Father   . Cancer Paternal Grandmother     ?    History   Social History  . Marital Status: Married    Spouse Name: N/A    Number of Children: N/A  . Years of Education: N/A   Occupational History  . Not on file.   Social History Main Topics  . Smoking status: Never Smoker   . Smokeless tobacco: Never Used  . Alcohol Use: No     Comment: occasionaly- once every 4 months  . Drug Use: No  . Sexually Active: Yes -- Female  partner(s)   Other Topics Concern  . Not on file   Social History Narrative  . No narrative on file    Current Outpatient Prescriptions on File Prior to Visit  Medication Sig Dispense Refill  . CAMILA 0.35 MG tablet Take 1 tablet by mouth Daily.      . clobetasol cream (TEMOVATE) 0.05 % Apply topically 2 (two) times daily.  30 g  1  . halobetasol (ULTRAVATE) 0.05 % ointment Apply topically daily as needed. rash  50 g  1  . hydrochlorothiazide (MICROZIDE) 12.5 MG capsule Take 1 capsule (12.5 mg total) by mouth every morning.  30 capsule  3  . cetirizine (ZYRTEC) 10 MG tablet Take 1 tablet (10 mg total) by mouth daily as needed for allergies or rhinitis.  30 tablet  11    No Known Allergies  Review of Systems  Review of Systems  Constitutional: Positive for malaise/fatigue. Negative for fever and chills.  HENT: Positive for congestion. Negative for hearing loss and nosebleeds.   Eyes: Negative for discharge.  Respiratory: Positive for cough, sputum production and shortness of breath. Negative for wheezing.   Cardiovascular: Negative for chest pain, palpitations and leg swelling.  Gastrointestinal: Negative for heartburn, nausea, vomiting, abdominal pain, diarrhea, constipation and blood in stool.  Genitourinary: Negative for dysuria, urgency, frequency and hematuria.  Musculoskeletal: Positive for myalgias. Negative for back pain and falls.  Skin: Negative for rash.  Neurological: Negative for dizziness, tremors, sensory change, focal weakness, loss of consciousness, weakness and headaches.  Endo/Heme/Allergies: Negative for polydipsia. Does not bruise/bleed easily.  Psychiatric/Behavioral: Negative for depression and suicidal ideas. The patient is not nervous/anxious and does not have insomnia.     Objective  BP 126/85  Pulse 75  Temp 99.6 F (37.6 C) (Temporal)  Ht 5\' 2"  (1.575 m)  Wt 206 lb (93.441 kg)  BMI 37.68 kg/m2  SpO2 99%  LMP 05/13/2012  Physical  Exam  Physical Exam  Constitutional: She is oriented to person, place, and time and well-developed, well-nourished, and in no distress. No distress.  HENT:  Head: Normocephalic and atraumatic.  Eyes: Conjunctivae normal are normal.  Neck: Neck supple. No thyromegaly present.  Cardiovascular: Normal rate, regular rhythm and normal heart sounds.   No murmur heard. Pulmonary/Chest: Effort normal and breath sounds normal. She has no wheezes.  Abdominal: She exhibits no distension and no mass.  Musculoskeletal: She exhibits no edema.  Lymphadenopathy:    She has no cervical adenopathy.  Neurological: She is alert and oriented to person, place, and time.  Skin: Skin is warm and dry. No rash noted. She is not diaphoretic.  Psychiatric: Memory, affect and judgment normal.       Assessment & Plan  HTN (hypertension) Good control with restarting of HCTZ, continue same, call or return with any concerns  Hypokalemia Increase K in the diet and recheck in one month or sooner if any concerning symptoms  Bronchitis Started on antibiotics, Tussionex and Mucinex, increase rest and hydration

## 2012-05-25 NOTE — Assessment & Plan Note (Signed)
Started on antibiotics, Tussionex and Mucinex, increase rest and hydration

## 2012-05-25 NOTE — Assessment & Plan Note (Signed)
Increase K in the diet and recheck in one month or sooner if any concerning symptoms

## 2012-05-25 NOTE — Assessment & Plan Note (Signed)
Good control with restarting of HCTZ, continue same, call or return with any concerns

## 2012-06-19 ENCOUNTER — Other Ambulatory Visit: Payer: Self-pay | Admitting: Family Medicine

## 2012-06-22 ENCOUNTER — Ambulatory Visit: Payer: BC Managed Care – PPO

## 2012-07-02 ENCOUNTER — Ambulatory Visit (INDEPENDENT_AMBULATORY_CARE_PROVIDER_SITE_OTHER): Payer: BC Managed Care – PPO | Admitting: Family Medicine

## 2012-07-02 ENCOUNTER — Encounter: Payer: Self-pay | Admitting: Family Medicine

## 2012-07-02 VITALS — BP 126/88 | HR 104 | Temp 98.8°F | Ht 62.0 in | Wt 205.1 lb

## 2012-07-02 DIAGNOSIS — R002 Palpitations: Secondary | ICD-10-CM

## 2012-07-02 DIAGNOSIS — R011 Cardiac murmur, unspecified: Secondary | ICD-10-CM

## 2012-07-02 DIAGNOSIS — I10 Essential (primary) hypertension: Secondary | ICD-10-CM

## 2012-07-02 DIAGNOSIS — K219 Gastro-esophageal reflux disease without esophagitis: Secondary | ICD-10-CM

## 2012-07-02 DIAGNOSIS — E876 Hypokalemia: Secondary | ICD-10-CM

## 2012-07-02 DIAGNOSIS — R0789 Other chest pain: Secondary | ICD-10-CM

## 2012-07-02 LAB — MAGNESIUM: Magnesium: 1.9 mg/dL (ref 1.5–2.5)

## 2012-07-02 LAB — RENAL FUNCTION PANEL
Albumin: 4.1 g/dL (ref 3.5–5.2)
BUN: 11 mg/dL (ref 6–23)
Calcium: 9.5 mg/dL (ref 8.4–10.5)
Phosphorus: 3.3 mg/dL (ref 2.3–4.6)
Potassium: 3.4 mEq/L — ABNORMAL LOW (ref 3.5–5.3)
Sodium: 138 mEq/L (ref 135–145)

## 2012-07-02 LAB — TSH: TSH: 2.677 u[IU]/mL (ref 0.350–4.500)

## 2012-07-02 MED ORDER — POTASSIUM CHLORIDE CRYS ER 20 MEQ PO TBCR: 20.0000 meq | EXTENDED_RELEASE_TABLET | Freq: Every day | ORAL | Status: DC

## 2012-07-02 MED ORDER — ALPRAZOLAM 0.25 MG PO TABS: 0.2500 mg | ORAL_TABLET | Freq: Two times a day (BID) | ORAL | Status: DC | PRN

## 2012-07-02 NOTE — Patient Instructions (Addendum)
Heart Murmur  A heart murmur is an extra sound heard by your caregiver when listening to your heart with a device called a stethoscope. The sound might be a "hum" or "whoosh" sound heard when the heart beats. The sound comes from turbulence when blood flows through the heart. There are two types of heart murmurs:  · Innocent (Harmless) murmurs: Most people with this type of heart murmur do not have signs or symptoms of heart problems. Many children have innocent heart murmurs. When an innocent heart murmur is found, there is no need to get tests or do treatment. Also, there is no need to restrict activities or stop playing sports. Innocent heart murmurs may be caused by many things. For example, it might be caused by a tiny hole or defect in the wall of the heart. These defects often close as a child grows. An innocent heart murmur may be heard by an examining clinician throughout your life. If you see a new caregiver, please let him or her know this was found during past exams.  · Abnormal murmurs: May have signs and symptoms of heart problems. These types of murmurs can occur in children and adults. In children, abnormal heart murmurs are typically caused from heart defects that are present at birth. In adults, abnormal murmurs are usually from heart valve problems caused by disease, infection, or aging.  SYMPTOMS   · Innocent (Harmless) murmurs do not cause symptoms or require you to limit physical activity.  · Many people with abnormal murmurs may or may not have symptoms. If symptoms do develop, they might include:  · Shortness of breath.  · Blue coloring of the skin, especially on the fingertips.  · Chest pain.  · Palpitations or feeling a "fluttering" or a "skipped" heart beat.  · Fainting.  · Persistent cough.  · Getting tired much faster than expected.  DIAGNOSIS   A heart murmur might be heard during a pre-sports physical or during any type of examination. When a murmur is heard, it may suggest a possible  problem. When this happens, your caregiver may ask you to see a heart specialist (cardiologist). You may also be asked to undergo one or more heart tests. In these cases, testing may vary depending upon what your caregiver heard. Tests for a heart murmur might include one or more of the following:  · EKG (electrocardiogram).  · Echocardiogram.  · Cardiac MRI.  For children and adults who have an abnormal heart murmur and want to play sports, it is important to complete testing, review test results, and receive recommendations from your caregiver. If heart disease is present, it may be risky to play.  Finding out the results of your test  Not all test results are available during your visit. If your test results are not back during the visit, make an appointment with your caregiver to find out the results. Do not assume everything is normal if you have not heard from your caregiver or the medical facility. It is important for you to follow up on all of your test results.   TREATMENT   As noted above, innocent (harmless) murmurs require no treatment or activity restriction. If the murmur represents a problem with the heart, treatment will depend upon the exact nature of the problem. In these cases, medicine or surgery may be needed to treat the problem.  HOME CARE INSTRUCTIONS  If you want to participate in sports or other types of strenuous physical activity, it is important to   discuss this first with your caregiver. If the murmur represents a problem with the heart and you choose to participate in sports, there is a small chance that a serious problem (including sudden death) could result.   SEEK MEDICAL CARE IF:   · You feel that your symptoms are slowly worsening.  · You develop any new symptoms that cause concern.  · You feel that you are having side effects from any medicines prescribed.  SEEK IMMEDIATE MEDICAL CARE IF:   · Chest pain develops.  · You are short of breath.  · You notice that your heart beats  irregularly often enough to cause you to worry.  · You have fainting spells.  · There is a worsening of any problems that brought you or your child in for medical care.  Document Released: 07/07/2004 Document Revised: 08/22/2011 Document Reviewed: 08/07/2007  [employer]® Patient Information ©2013 [employer].

## 2012-07-03 ENCOUNTER — Other Ambulatory Visit (HOSPITAL_COMMUNITY): Payer: Self-pay | Admitting: Radiology

## 2012-07-03 ENCOUNTER — Encounter: Payer: Self-pay | Admitting: Family Medicine

## 2012-07-03 DIAGNOSIS — R011 Cardiac murmur, unspecified: Secondary | ICD-10-CM

## 2012-07-03 DIAGNOSIS — R079 Chest pain, unspecified: Secondary | ICD-10-CM

## 2012-07-03 DIAGNOSIS — R002 Palpitations: Secondary | ICD-10-CM

## 2012-07-04 ENCOUNTER — Ambulatory Visit (HOSPITAL_COMMUNITY): Payer: BC Managed Care – PPO | Attending: Family Medicine | Admitting: Radiology

## 2012-07-04 DIAGNOSIS — I369 Nonrheumatic tricuspid valve disorder, unspecified: Secondary | ICD-10-CM | POA: Insufficient documentation

## 2012-07-04 DIAGNOSIS — R079 Chest pain, unspecified: Secondary | ICD-10-CM

## 2012-07-04 DIAGNOSIS — R002 Palpitations: Secondary | ICD-10-CM | POA: Insufficient documentation

## 2012-07-04 DIAGNOSIS — R011 Cardiac murmur, unspecified: Secondary | ICD-10-CM

## 2012-07-04 DIAGNOSIS — I10 Essential (primary) hypertension: Secondary | ICD-10-CM | POA: Insufficient documentation

## 2012-07-04 DIAGNOSIS — I059 Rheumatic mitral valve disease, unspecified: Secondary | ICD-10-CM | POA: Insufficient documentation

## 2012-07-04 NOTE — Progress Notes (Signed)
Echocardiogram performed.  

## 2012-07-04 NOTE — Progress Notes (Signed)
Quick Note:  Patient Informed and voiced understanding ______ 

## 2012-07-08 ENCOUNTER — Encounter: Payer: Self-pay | Admitting: Family Medicine

## 2012-07-08 DIAGNOSIS — R002 Palpitations: Secondary | ICD-10-CM

## 2012-07-08 HISTORY — DX: Palpitations: R00.2

## 2012-07-08 NOTE — Assessment & Plan Note (Signed)
Patient denies any reflux symptoms at this time.

## 2012-07-08 NOTE — Assessment & Plan Note (Signed)
Worse over past couple of weeks, denies any changes in diet or lifestyle. Patient EKG shows RAE and echo shows thickening of atrium. Is encouraged to avoid caffeine and seek care if symptoms develop and do not resolve. Avoid caffeine. Given Alprazolam to use prn. Will arrange cardiology consultation

## 2012-07-08 NOTE — Progress Notes (Signed)
Patient ID: Brittany Johnson, female   DOB: Oct 22, 1970, 42 y.o.   MRN: [id number] Brittany Johnson [id number] 1970-07-24 07/08/2012      Progress Note-Follow Up  Subjective  Chief Complaint  Chief Complaint  Patient presents with  . heart flutter    X a few weeks- with a cough    HPI  Is a 42 year old Caucasian female who's been struggling with several weeks' worth of increasing palpitations. She reports time although we do not generally weak her from sleep. She can be resting more active. He generally last seconds but can last for a longer period she gets a sensation of a flutter in her chest as mentioned her throat without associated symptoms. Her concern is that it may happen several times a day. She's had similar symptoms in the past but never so frequently. She denies any increased stressors. She denies any reflux type symptoms. She denies increasing caffeine or dietary changes. She is noting an occasional dry cough but no sour taste in the throat or shortness showed is noted. No recent flare in chest pain she doesn't exam a mild sense of shortness of breath not always associated with the palpitations. No GI or GU complaints. No fevers or chills or signs of acute illness.  Past Medical History  Diagnosis Date  . Hypertension   . Chicken pox as a child  . Hepatitis C carrier 02/07/2011  . Hx: UTI (urinary tract infection) 02/07/2011  . Abnormal cervical cytology 02/07/2011  . HTN (hypertension) 02/07/2011  . Obese 03/04/2011  . Tinea pedis of right foot 03/07/2011  . Psoriasis 09/02/2011  . Left lateral epicondylitis 09/02/2011  . Right shoulder strain 03/29/2012  . Hypokalemia 05/25/2012  . Bronchitis 05/25/2012  . Palpitations 07/08/2012    Past Surgical History  Procedure Date  . Dilation and curettage of uterus 2003    Family History  Problem Relation Age of Onset  . Diabetes Mother     type 2  . Heart attack Father   . Hypertension Father   . Cancer Paternal Grandmother      ?    History   Social History  . Marital Status: Married    Spouse Name: N/A    Number of Children: N/A  . Years of Education: N/A   Occupational History  . Not on file.   Social History Main Topics  . Smoking status: Never Smoker   . Smokeless tobacco: Never Used  . Alcohol Use: No     Comment: occasionaly- once every 4 months  . Drug Use: No  . Sexually Active: Yes -- Female partner(s)   Other Topics Concern  . Not on file   Social History Narrative  . No narrative on file    Current Outpatient Prescriptions on File Prior to Visit  Medication Sig Dispense Refill  . CAMILA 0.35 MG tablet Take 1 tablet by mouth Daily.      . hydrochlorothiazide (MICROZIDE) 12.5 MG capsule Take 1 capsule (12.5 mg total) by mouth every morning.  30 capsule  3  . cetirizine (ZYRTEC) 10 MG tablet Take 1 tablet (10 mg total) by mouth daily as needed for allergies or rhinitis.  30 tablet  11  . chlorpheniramine-HYDROcodone (TUSSIONEX PENNKINETIC ER) 10-8 MG/5ML LQCR Take 5 mLs by mouth at bedtime as needed.  140 mL  1  . clobetasol cream (TEMOVATE) 0.05 % Apply topically 2 (two) times daily.  30 g  1  . Diclofenac Sodium 3 % GEL Topical prn for pain      .  guaiFENesin (MUCINEX) 600 MG 12 hr tablet Take 2 tablets (1,200 mg total) by mouth 2 (two) times daily. X 10 days      . potassium chloride SA (K-DUR,KLOR-CON) 20 MEQ tablet Take 1 tablet (20 mEq total) by mouth daily. Daily x 5 days then as directed  30 tablet  1    No Known Allergies  Review of Systems  Review of Systems  Constitutional: Negative for fever and malaise/fatigue.  HENT: Negative for congestion.   Eyes: Negative for discharge.  Respiratory: Negative for shortness of breath.   Cardiovascular: Positive for palpitations. Negative for chest pain and leg swelling.  Gastrointestinal: Negative for nausea, abdominal pain and diarrhea.  Genitourinary: Negative for dysuria.  Musculoskeletal: Positive for joint pain. Negative  for falls.       Right shoulder pain, worse by end of day with increased use.   Skin: Negative for rash.  Neurological: Negative for loss of consciousness and headaches.  Endo/Heme/Allergies: Negative for polydipsia.  Psychiatric/Behavioral: Negative for depression and suicidal ideas. The patient is not nervous/anxious and does not have insomnia.     Objective  BP 126/88  Pulse 104  Temp 98.8 F (37.1 C) (Oral)  Ht 5\' 2"  (1.575 m)  Wt 205 lb 1.9 oz (93.042 kg)  BMI 37.52 kg/m2  SpO2 99%  LMP 06/18/2012  Physical Exam  Physical Exam  Constitutional: She is oriented to person, place, and time and well-developed, well-nourished, and in no distress. No distress.  HENT:  Head: Normocephalic and atraumatic.  Eyes: Conjunctivae normal are normal.  Neck: Neck supple. No thyromegaly present.  Cardiovascular: Normal rate, regular rhythm and normal heart sounds.   No murmur heard. Pulmonary/Chest: Effort normal and breath sounds normal. She has no wheezes.  Abdominal: She exhibits no distension and no mass.  Musculoskeletal: She exhibits no edema.  Lymphadenopathy:    She has no cervical adenopathy.  Neurological: She is alert and oriented to person, place, and time.  Skin: Skin is warm and dry. No rash noted. She is not diaphoretic.  Psychiatric: Memory, affect and judgment normal.    Lab Results  Component Value Date   TSH 2.677 07/02/2012   Lab Results  Component Value Date   WBC 9.8 09/02/2011   HGB 14.0 09/02/2011   HCT 41.9 09/02/2011   MCV 86.3 09/02/2011   PLT 198.0 09/02/2011   Lab Results  Component Value Date   CREATININE 1.03 07/02/2012   BUN 11 07/02/2012   NA 138 07/02/2012   K 3.4* 07/02/2012   CL 102 07/02/2012   CO2 28 07/02/2012   Lab Results  Component Value Date   ALT 17 09/02/2011   AST 20 09/02/2011   ALKPHOS 54 09/02/2011   BILITOT 0.5 09/02/2011   Lab Results  Component Value Date   CHOL 143 09/02/2011   Lab Results  Component Value Date   HDL  49.80 09/02/2011   Lab Results  Component Value Date   LDLCALC 79 09/02/2011   Lab Results  Component Value Date   TRIG 72.0 09/02/2011   Lab Results  Component Value Date   CHOLHDL 3 09/02/2011     Assessment & Plan  HTN (hypertension) Well controlled, no changes to meds  Palpitations Worse over past couple of weeks, denies any changes in diet or lifestyle. Patient EKG shows RAE and echo shows thickening of atrium. Is encouraged to avoid caffeine and seek care if symptoms develop and do not resolve. Avoid caffeine. Given Alprazolam to use prn.  Will arrange cardiology consultation  GERD Patient denies any reflux symptoms at this time.

## 2012-07-08 NOTE — Assessment & Plan Note (Signed)
Well controlled, no changes to meds 

## 2012-07-16 ENCOUNTER — Ambulatory Visit: Payer: BC Managed Care – PPO | Admitting: Cardiology

## 2012-08-03 ENCOUNTER — Ambulatory Visit: Payer: BC Managed Care – PPO | Admitting: Family Medicine

## 2012-08-13 ENCOUNTER — Ambulatory Visit: Payer: BC Managed Care – PPO | Admitting: Cardiology

## 2012-08-24 ENCOUNTER — Ambulatory Visit: Payer: BC Managed Care – PPO | Admitting: Family Medicine

## 2012-09-01 ENCOUNTER — Other Ambulatory Visit: Payer: Self-pay | Admitting: Family Medicine

## 2012-09-18 ENCOUNTER — Encounter: Payer: Self-pay | Admitting: Cardiology

## 2012-09-18 ENCOUNTER — Ambulatory Visit (INDEPENDENT_AMBULATORY_CARE_PROVIDER_SITE_OTHER): Payer: BC Managed Care – PPO | Admitting: Cardiology

## 2012-09-18 VITALS — BP 123/85 | HR 91 | Ht 62.0 in | Wt 201.8 lb

## 2012-09-18 DIAGNOSIS — I10 Essential (primary) hypertension: Secondary | ICD-10-CM

## 2012-09-18 DIAGNOSIS — R002 Palpitations: Secondary | ICD-10-CM

## 2012-09-18 LAB — BASIC METABOLIC PANEL
BUN: 13 mg/dL (ref 6–23)
CO2: 28 mEq/L (ref 19–32)
Calcium: 9.4 mg/dL (ref 8.4–10.5)
Creatinine, Ser: 1 mg/dL (ref 0.4–1.2)
GFR: 63.56 mL/min (ref >60.00)
Glucose, Bld: 83 mg/dL (ref 70–99)
Sodium: 140 mEq/L (ref 135–145)

## 2012-09-18 NOTE — Patient Instructions (Addendum)
Your physician recommends that you have lab work today--BMET.   Your physician has recommended that you wear a holter monitor. Holter monitors are medical devices that record the heart's electrical activity. Doctors most often use these monitors to diagnose arrhythmias. Arrhythmias are problems with the speed or rhythm of the heartbeat. The monitor is a small, portable device. You can wear one while you do your normal daily activities. This is usually used to diagnose what is causing palpitations/syncope (passing out). 24 hour holter  Your physician recommends that you schedule a follow-up appointment as needed with Dr Shirlee Latch.

## 2012-09-18 NOTE — Progress Notes (Signed)
Patient ID: Brittany Johnson, female   DOB: 12-06-1970, 42 y.o.   MRN: [id number] PCP: Dr. Abner Greenspan  42 yo with history of HTN and palpitations presents for cardiology evaluation.  About 5-6 weeks ago, she developed palpitations => fluttering in her chest that tended to occur in the evening.  It would last for 10-15 minutes at a time and would happen on most days.  No lightheadedness or syncope.  No chest pain or exertional dyspnea.  She has HTN controlled by HCTZ.  K was low when checked in 1/14.  The palpitations have mostly resolved at this point.  She has 2 caffeinated drinks a day on average, no change recently.  She has not been using over the counter decongestants, etc.  She has not been under any more stress than usual.  TSH was normal.   Labs (3/13): LDL 79, HDL 50 Labs (1/14): K 3.4, creatinine 1.13, TSH normal.   ECG (1/14): NSR, borderline RAE  PMH: 1. HTN 2. Obese 3. Psoriasis 4. Palpitations: Echo (1/14) with EF 65%, very mild focal basal septal hypertrophy, grade I diastolic dysfunction, mild LAE, no SAM or LVOT gradient.   SH: Married, nonsmoker, works at [employer], lives in [place].   FH: Father with MI, not sure how old he was.   ROS: All systems reviewed and negative except as per HPI.   Current Outpatient Prescriptions  Medication Sig Dispense Refill  . ALPRAZolam (XANAX) 0.25 MG tablet Take 1 tablet (0.25 mg total) by mouth 2 (two) times daily as needed for sleep or anxiety. Palpitations or anxiety  20 tablet  0  . CAMILA 0.35 MG tablet Take 1 tablet by mouth Daily.      . cetirizine (ZYRTEC) 10 MG tablet Take 1 tablet (10 mg total) by mouth daily as needed for allergies or rhinitis.  30 tablet  11  . guaiFENesin (MUCINEX) 600 MG 12 hr tablet Take 2 tablets (1,200 mg total) by mouth 2 (two) times daily. X 10 days      . hydrochlorothiazide (MICROZIDE) 12.5 MG capsule TAKE ONE CAPSULE BY MOUTH IN THE MORNING  30 capsule  3   No current facility-administered  medications for this visit.    BP 123/85  Pulse 91  Ht 5\' 2"  (1.575 m)  Wt 201 lb 12.8 oz (91.536 kg)  BMI 36.9 kg/m2  SpO2 99% General: NAD, obese Neck: No JVD, no thyromegaly or thyroid nodule.  Lungs: Clear to auscultation bilaterally with normal respiratory effort. CV: Nondisplaced PMI.  Heart regular S1/S2, no S3/S4, no murmur.  No peripheral edema.  No carotid bruit.  Normal pedal pulses.  Abdomen: Soft, nontender, no hepatosplenomegaly, no distention.  Skin: Intact without lesions or rashes.  Neurologic: Alert and oriented x 3.  Psych: Normal affect. Extremities: No clubbing or cyanosis.  HEENT: Normal.   Assessment/Plan: 1. Palpitations: Possible PACs or PVCs.  Mostly resolved now.  TSH was normal.  ECG was not significantly abnormal.  Her K was low at the time, likely related to HCTZ use.  This could be contributing to the palpitations.  Echo was essentially unremarkable.  Very mild focal basal septal hypertrophy with normal EF (no SAM, no LVOT gradient).  - Check BMET for potassium. - Will get 24 hour holter monitor.  2. HTN: Appears to be controlled on HCTZ.   Marca Ancona 09/18/2012 2:29 PM

## 2012-09-19 ENCOUNTER — Telehealth: Payer: Self-pay | Admitting: Cardiology

## 2012-09-19 DIAGNOSIS — E876 Hypokalemia: Secondary | ICD-10-CM

## 2012-09-19 MED ORDER — POTASSIUM CHLORIDE CRYS ER 20 MEQ PO TBCR: 20.0000 meq | EXTENDED_RELEASE_TABLET | Freq: Every day | ORAL | Status: DC

## 2012-09-19 NOTE — Telephone Encounter (Signed)
LMTCB

## 2012-09-19 NOTE — Telephone Encounter (Signed)
New problem ° ° °Pt returning your call from yesterday. Please call pt. °

## 2012-09-19 NOTE — Telephone Encounter (Signed)
Follow up     Pt was returning your call.

## 2012-09-19 NOTE — Telephone Encounter (Signed)
Spoke with pt about recent lab and recommendations

## 2012-09-20 ENCOUNTER — Ambulatory Visit (INDEPENDENT_AMBULATORY_CARE_PROVIDER_SITE_OTHER): Payer: BC Managed Care – PPO

## 2012-09-20 DIAGNOSIS — R002 Palpitations: Secondary | ICD-10-CM

## 2012-09-20 DIAGNOSIS — R55 Syncope and collapse: Secondary | ICD-10-CM

## 2012-09-21 ENCOUNTER — Ambulatory Visit: Payer: BC Managed Care – PPO | Admitting: Family Medicine

## 2012-09-24 NOTE — Progress Notes (Signed)
Placed a 24 hr holter monitor and went over instructions on how to use it and when to return it

## 2012-09-27 ENCOUNTER — Telehealth: Payer: Self-pay | Admitting: Cardiology

## 2012-09-27 NOTE — Telephone Encounter (Signed)
Pt is aware of 24 hour holter monitor results Mylo Red RN

## 2012-09-27 NOTE — Telephone Encounter (Signed)
24 hr Holter is normal, no abnormal rhythms noted. Will wait for pt to call back for results.

## 2012-09-27 NOTE — Telephone Encounter (Signed)
lmtcb

## 2012-09-27 NOTE — Telephone Encounter (Signed)
New problem   Pt want to know the results of her heart monitor. Please call pt

## 2012-09-28 ENCOUNTER — Telehealth: Payer: Self-pay | Admitting: *Deleted

## 2012-09-28 NOTE — Telephone Encounter (Signed)
Spoke with pt, pt aware monitor reviewed by dr Shirlee Latch was normal.

## 2012-10-02 ENCOUNTER — Ambulatory Visit: Payer: BC Managed Care – PPO

## 2012-10-04 ENCOUNTER — Other Ambulatory Visit (INDEPENDENT_AMBULATORY_CARE_PROVIDER_SITE_OTHER): Payer: BC Managed Care – PPO

## 2012-10-04 DIAGNOSIS — E876 Hypokalemia: Secondary | ICD-10-CM

## 2012-10-04 LAB — BASIC METABOLIC PANEL
BUN: 14 mg/dL (ref 6–23)
Chloride: 104 mEq/L (ref 96–112)
Creatinine, Ser: 1 mg/dL (ref 0.4–1.2)
Glucose, Bld: 73 mg/dL (ref 70–99)

## 2012-10-05 ENCOUNTER — Telehealth: Payer: Self-pay | Admitting: Cardiology

## 2012-10-05 DIAGNOSIS — E876 Hypokalemia: Secondary | ICD-10-CM

## 2012-10-05 NOTE — Telephone Encounter (Signed)
New Problem:    Patient called in returning Anne's call regarding her lab results.  Please call back.

## 2012-10-05 NOTE — Telephone Encounter (Signed)
Scheduled follow up labs in 2 weeks  Notes Recorded by Burnell Blanks on 10/05/2012 at 1:43 PM Advised patient, she will take daily and increase potassium intake. ------  Notes Recorded by Jacqlyn Krauss, RN on 10/04/2012 at 5:21 PM LMTCB for pt ------  Notes Recorded by Jacqlyn Krauss, RN on 10/04/2012 at 5:19 PM I spoke with pt and confirmed she is taking KCL 20 mEq daily. She states she has missed a couple of doses. I reviewed with Dr Shirlee Latch. He recommended pt take KCL regularly and eat bananas. Preliminarily reviewed by Triage. Awaiting MD review and signature.

## 2012-10-19 ENCOUNTER — Other Ambulatory Visit: Payer: BC Managed Care – PPO

## 2012-10-30 ENCOUNTER — Encounter: Payer: Self-pay | Admitting: Certified Nurse Midwife

## 2012-11-01 ENCOUNTER — Ambulatory Visit: Payer: Self-pay | Admitting: Certified Nurse Midwife

## 2012-11-01 ENCOUNTER — Encounter: Payer: Self-pay | Admitting: Certified Nurse Midwife

## 2012-11-01 DIAGNOSIS — Z01419 Encounter for gynecological examination (general) (routine) without abnormal findings: Secondary | ICD-10-CM

## 2012-11-15 ENCOUNTER — Ambulatory Visit: Payer: BC Managed Care – PPO | Admitting: Family Medicine

## 2012-11-16 ENCOUNTER — Ambulatory Visit: Payer: BC Managed Care – PPO | Admitting: Family Medicine

## 2012-11-22 ENCOUNTER — Encounter: Payer: Self-pay | Admitting: Family Medicine

## 2012-11-22 ENCOUNTER — Ambulatory Visit (INDEPENDENT_AMBULATORY_CARE_PROVIDER_SITE_OTHER): Payer: BC Managed Care – PPO | Admitting: Family Medicine

## 2012-11-22 VITALS — BP 122/80 | HR 69 | Temp 98.3°F | Ht 62.0 in | Wt 203.0 lb

## 2012-11-22 DIAGNOSIS — R002 Palpitations: Secondary | ICD-10-CM

## 2012-11-22 DIAGNOSIS — E876 Hypokalemia: Secondary | ICD-10-CM

## 2012-11-22 DIAGNOSIS — E669 Obesity, unspecified: Secondary | ICD-10-CM

## 2012-11-22 DIAGNOSIS — N920 Excessive and frequent menstruation with regular cycle: Secondary | ICD-10-CM

## 2012-11-22 DIAGNOSIS — I10 Essential (primary) hypertension: Secondary | ICD-10-CM

## 2012-11-22 DIAGNOSIS — K219 Gastro-esophageal reflux disease without esophagitis: Secondary | ICD-10-CM

## 2012-11-22 LAB — CBC
MCHC: 34.2 g/dL (ref 30.0–36.0)
MCV: 82.1 fL (ref 78.0–100.0)
Platelets: 241 10*3/uL (ref 150–400)
RDW: 14.3 % (ref 11.5–15.5)
WBC: 7.5 10*3/uL (ref 4.0–10.5)

## 2012-11-22 LAB — HEPATIC FUNCTION PANEL
Albumin: 4.1 g/dL (ref 3.5–5.2)
Alkaline Phosphatase: 53 U/L (ref 39–117)
Total Bilirubin: 0.6 mg/dL (ref 0.3–1.2)
Total Protein: 6.7 g/dL (ref 6.0–8.3)

## 2012-11-22 LAB — RENAL FUNCTION PANEL
Albumin: 4.1 g/dL (ref 3.5–5.2)
BUN: 12 mg/dL (ref 6–23)
Calcium: 9.3 mg/dL (ref 8.4–10.5)
Chloride: 105 mEq/L (ref 96–112)
Creat: 0.99 mg/dL (ref 0.50–1.10)
Phosphorus: 3 mg/dL (ref 2.3–4.6)

## 2012-11-22 LAB — TSH: TSH: 2.218 u[IU]/mL (ref 0.350–4.500)

## 2012-11-22 NOTE — Patient Instructions (Addendum)
Next visit annual with labs prior to visit, lipid, renal, cbc, tsh, hepatic  Vinegar soaks Lamisil vick's vapor rub at bed Lavendar oil  Ringworm, Nail A fungal infection of the nail (tinea unguium/onychomycosis) is common. It is common as the visible part of the nail is composed of dead cells which have no blood supply to help prevent infection. It occurs because fungi are everywhere and will pick any opportunity to grow on any dead material. Because nails are very slow growing they require up to 2 years of treatment with anti-fungal medications. The entire nail back to the base is infected. This includes approximately  of the nail which you cannot see. If your caregiver has prescribed a medication by mouth, take it every day and as directed. No progress will be seen for at least 6 to 9 months. Do not be disappointed! Because fungi live on dead cells with little or no exposure to blood supply, medication delivery to the infection is slow; thus the cure is slow. It is also why you can observe no progress in the first 6 months. The nail becoming cured is the base of the nail, as it has the blood supply. Topical medication such as creams and ointments are usually not effective. Important in successful treatment of nail fungus is closely following the medication regimen that your doctor prescribes. Sometimes you and your caregiver may elect to speed up this process by surgical removal of all the nails. Even this may still require 6 to 9 months of additional oral medications. See your caregiver as directed. Remember there will be no visible improvement for at least 6 months. See your caregiver sooner if other signs of infection (redness and swelling) develop. Document Released: 05/27/2000 Document Revised: 08/22/2011 Document Reviewed: 08/05/2008 [facility] Patient Information 2014 [place], [place].   DASH Diet The DASH diet stands for "Dietary Approaches to Stop Hypertension." It is a healthy eating plan  that has been shown to reduce high blood pressure (hypertension) in as little as 14 days, while also possibly providing other significant health benefits. These other health benefits include reducing the risk of breast cancer after menopause and reducing the risk of type 2 diabetes, heart disease, colon cancer, and stroke. Health benefits also include weight loss and slowing kidney failure in patients with chronic kidney disease.  DIET GUIDELINES  Limit salt (sodium). Your diet should contain less than 1500 mg of sodium daily.  Limit refined or processed carbohydrates. Your diet should include mostly whole grains. Desserts and added sugars should be used sparingly.  Include small amounts of heart-healthy fats. These types of fats include nuts, oils, and tub margarine. Limit saturated and trans fats. These fats have been shown to be harmful in the body. CHOOSING FOODS  The following food groups are based on a 2000 calorie diet. See your Registered Dietitian for individual calorie needs. Grains and Grain Products (6 to 8 servings daily)  Eat More Often: Whole-wheat bread, brown rice, whole-grain or wheat pasta, quinoa, popcorn without added fat or salt (air popped).  Eat Less Often: White bread, white pasta, white rice, cornbread. Vegetables (4 to 5 servings daily)  Eat More Often: Fresh, frozen, and canned vegetables. Vegetables may be raw, steamed, roasted, or grilled with a minimal amount of fat.  Eat Less Often/Avoid: Creamed or fried vegetables. Vegetables in a cheese sauce. Fruit (4 to 5 servings daily)  Eat More Often: All fresh, canned (in natural juice), or frozen fruits. Dried fruits without added sugar. One hundred percent  fruit juice ( cup [237 mL] daily).  Eat Less Often: Dried fruits with added sugar. Canned fruit in light or heavy syrup. [employer], Fish, and Poultry (2 servings or less daily. One serving is 3 to 4 oz [85-114 g]).  Eat More Often: Ninety percent or leaner  ground beef, tenderloin, sirloin. Round cuts of beef, chicken breast, [place] breast. All fish. Grill, bake, or broil your meat. Nothing should be fried.  Eat Less Often/Avoid: Fatty cuts of meat, [place], or chicken leg, thigh, or wing. Fried cuts of meat or fish. Dairy (2 to 3 servings)  Eat More Often: Low-fat or fat-free milk, low-fat plain or light yogurt, reduced-fat or part-skim cheese.  Eat Less Often/Avoid: Milk (whole, 2%).Whole milk yogurt. Full-fat cheeses. Nuts, Seeds, and Legumes (4 to 5 servings per week)  Eat More Often: All without added salt.  Eat Less Often/Avoid: Salted nuts and seeds, canned beans with added salt. Fats and Sweets (limited)  Eat More Often: Vegetable oils, tub margarines without trans fats, sugar-free gelatin. Mayonnaise and salad dressings.  Eat Less Often/Avoid: Coconut oils, palm oils, butter, stick margarine, cream, half and half, cookies, candy, pie. FOR MORE INFORMATION The Dash Diet Eating Plan: www.dashdiet.org Document Released: 05/19/2011 Document Revised: 08/22/2011 Document Reviewed: 05/19/2011 [facility] Patient Information 2014 [place], [place].

## 2012-11-22 NOTE — Assessment & Plan Note (Addendum)
DASH diet, and increased exercise

## 2012-11-24 ENCOUNTER — Encounter: Payer: Self-pay | Admitting: Family Medicine

## 2012-11-24 DIAGNOSIS — N920 Excessive and frequent menstruation with regular cycle: Secondary | ICD-10-CM

## 2012-11-24 HISTORY — DX: Excessive and frequent menstruation with regular cycle: N92.0

## 2012-11-24 NOTE — Assessment & Plan Note (Signed)
Well controlled, no changes 

## 2012-11-24 NOTE — Assessment & Plan Note (Signed)
Sees her gynecologist in 2 weeks will discuss with them her recent increase in menstrual flow

## 2012-11-24 NOTE — Assessment & Plan Note (Signed)
No recent episodes. No new concerns. Has been seen by cardiology and testing has been reassuring.

## 2012-11-24 NOTE — Progress Notes (Signed)
Patient ID: Brittany Johnson, female   DOB: 14-Nov-1970, 42 y.o.   MRN: [id number] Brittany Johnson [id number] Jan 23, 1971 11/24/2012      Progress Note-Follow Up  Subjective  Chief Complaint  Chief Complaint  Patient presents with  . Follow-up    on cardiology results    HPI  This is a 42 year old Caucasian female who is in today for followup. She says she's feeling fairly well. She's had no further palpitations and has been seen by cardiology. She is not taking hydrochlorothiazide or potassium at this time. Her largest complaint today is of increased menstrual flow. She's having 2 weeks of bleeding in a row with very heavy bleeding the first 4-5 days. She has an appointment with her gynecologist in 2 weeks. She's also complaining of some numbness in her right great toe. This has been present off and on for the last 3 months she denies any injury or back pain. No GI or GU complaints otherwise. No chest pain or shortness of breath  Past Medical History  Diagnosis Date  . Hypertension   . Chicken pox as a child  . Hepatitis C carrier 02/07/2011  . Hx: UTI (urinary tract infection) 02/07/2011  . Abnormal cervical cytology 02/07/2011  . HTN (hypertension) 02/07/2011  . Obese 03/04/2011  . Tinea pedis of right foot 03/07/2011  . Psoriasis 09/02/2011  . Left lateral epicondylitis 09/02/2011  . Right shoulder strain 03/29/2012  . Hypokalemia 05/25/2012  . Bronchitis 05/25/2012  . Palpitations 07/08/2012    Past Surgical History  Procedure Laterality Date  . Dilation and curettage of uterus  2003    SAB    Family History  Problem Relation Age of Onset  . Diabetes Mother     type 2  . Heart attack Father   . Hypertension Father   . Cancer Paternal Grandmother     ?    History   Social History  . Marital Status: Married    Spouse Name: N/A    Number of Children: N/A  . Years of Education: N/A   Occupational History  . Not on file.   Social History Main Topics  . Smoking  status: Never Smoker   . Smokeless tobacco: Never Used  . Alcohol Use: No     Comment: occasionaly- once every 4 months  . Drug Use: No  . Sexually Active: Yes -- Female partner(s)   Other Topics Concern  . Not on file   Social History Narrative  . No narrative on file    Current Outpatient Prescriptions on File Prior to Visit  Medication Sig Dispense Refill  . CAMILA 0.35 MG tablet Take 1 tablet by mouth Daily.      . cetirizine (ZYRTEC) 10 MG tablet Take 1 tablet (10 mg total) by mouth daily as needed for allergies or rhinitis.  30 tablet  11  . guaiFENesin (MUCINEX) 600 MG 12 hr tablet Take 2 tablets (1,200 mg total) by mouth 2 (two) times daily. X 10 days      . KRILL OIL PO Take by mouth daily.       No current facility-administered medications on file prior to visit.    No Known Allergies  Review of Systems  Review of Systems  Constitutional: Positive for malaise/fatigue. Negative for fever.  HENT: Negative for congestion.   Eyes: Negative for discharge.  Respiratory: Negative for shortness of breath.   Cardiovascular: Negative for chest pain, palpitations and leg swelling.  Gastrointestinal: Negative for nausea, abdominal pain and  diarrhea.  Genitourinary: Negative for dysuria.  Musculoskeletal: Negative for falls.  Skin: Negative for rash.  Neurological: Positive for sensory change. Negative for loss of consciousness and headaches.       Numbness intermittent in right toe, not progressive over past 3 months  Endo/Heme/Allergies: Negative for polydipsia.  Psychiatric/Behavioral: Negative for depression and suicidal ideas. The patient is not nervous/anxious and does not have insomnia.     Objective  BP 122/80  Pulse 69  Temp(Src) 98.3 F (36.8 C) (Oral)  Ht 5\' 2"  (1.575 m)  Wt 203 lb 0.6 oz (92.098 kg)  BMI 37.13 kg/m2  SpO2 98%  LMP 11/17/2012  Physical Exam  Physical Exam  Constitutional: She is oriented to person, place, and time and well-developed,  well-nourished, and in no distress. No distress.  HENT:  Head: Normocephalic and atraumatic.  Eyes: Conjunctivae are normal.  Neck: Neck supple. No thyromegaly present.  Cardiovascular: Normal rate, regular rhythm and normal heart sounds.   No murmur heard. Pulmonary/Chest: Effort normal and breath sounds normal. She has no wheezes.  Abdominal: She exhibits no distension and no mass.  Musculoskeletal: She exhibits no edema.  Lymphadenopathy:    She has no cervical adenopathy.  Neurological: She is alert and oriented to person, place, and time.  Skin: Skin is warm and dry. No rash noted. She is not diaphoretic.  Psychiatric: Memory, affect and judgment normal.    Lab Results  Component Value Date   TSH 2.218 11/22/2012   Lab Results  Component Value Date   WBC 7.5 11/22/2012   HGB 12.9 11/22/2012   HCT 37.7 11/22/2012   MCV 82.1 11/22/2012   PLT 241 11/22/2012   Lab Results  Component Value Date   CREATININE 0.99 11/22/2012   BUN 12 11/22/2012   NA 138 11/22/2012   K 4.5 11/22/2012   CL 105 11/22/2012   CO2 27 11/22/2012   Lab Results  Component Value Date   ALT 18 11/22/2012   AST 17 11/22/2012   ALKPHOS 53 11/22/2012   BILITOT 0.6 11/22/2012   Lab Results  Component Value Date   CHOL 143 09/02/2011   Lab Results  Component Value Date   HDL 49.80 09/02/2011   Lab Results  Component Value Date   LDLCALC 79 09/02/2011   Lab Results  Component Value Date   TRIG 72.0 09/02/2011   Lab Results  Component Value Date   CHOLHDL 3 09/02/2011     Assessment & Plan  Obese DASH diet, and increased exercise  HTN (hypertension) Well controlled, no changes.   Palpitations No recent episodes. No new concerns. Has been seen by cardiology and testing has been reassuring.   GERD Generally well controlled, no changes. Avoid offending foods.   Hypokalemia Resolved with lab work at visit. Given handout on foods containing potassium  Menorrhagia Sees her gynecologist in 2  weeks will discuss with them her recent increase in menstrual flow

## 2012-11-24 NOTE — Assessment & Plan Note (Signed)
Generally well controlled, no changes. Avoid offending foods.

## 2012-11-24 NOTE — Assessment & Plan Note (Signed)
Resolved with lab work at visit. Given handout on foods containing potassium

## 2012-12-03 ENCOUNTER — Telehealth: Payer: Self-pay | Admitting: Certified Nurse Midwife

## 2012-12-03 NOTE — Telephone Encounter (Signed)
Spoke with pt who has been having irregular menses for at least 2 months, sometimes bleeding every other week. Pt saw PCP who suggested she see [place] for possible perimenopause. Pt is due for Aex. Advised pt to make appt for Aex at conclusion of OV, as insurance won't permit both on same day. Pt agreeable. Sched OV with DL tomorrow at 1:61.

## 2012-12-04 ENCOUNTER — Ambulatory Visit: Payer: BC Managed Care – PPO

## 2012-12-04 ENCOUNTER — Encounter: Payer: Self-pay | Admitting: Certified Nurse Midwife

## 2012-12-04 ENCOUNTER — Ambulatory Visit (INDEPENDENT_AMBULATORY_CARE_PROVIDER_SITE_OTHER): Payer: BC Managed Care – PPO | Admitting: Certified Nurse Midwife

## 2012-12-04 VITALS — BP 110/60 | HR 68 | Resp 16 | Ht 62.25 in | Wt 196.0 lb

## 2012-12-04 DIAGNOSIS — N760 Acute vaginitis: Secondary | ICD-10-CM

## 2012-12-04 DIAGNOSIS — N949 Unspecified condition associated with female genital organs and menstrual cycle: Secondary | ICD-10-CM

## 2012-12-04 DIAGNOSIS — N938 Other specified abnormal uterine and vaginal bleeding: Secondary | ICD-10-CM

## 2012-12-04 MED ORDER — METRONIDAZOLE 500 MG PO TABS: 500.0000 mg | ORAL_TABLET | Freq: Two times a day (BID) | ORAL | Status: DC

## 2012-12-04 NOTE — Progress Notes (Signed)
42 yo married g4 p2022 white female here with complaint of periods every 2 weeks for past 2 months only. Duration of cycle 4 days with heavy first day and then decreases. Denies large clots or heavy bleeding soaking clothing. Uses tampon and pad together on first day only. Contraception is Camilla taking consistently with no missed pills. Recently treated for potassium depletion with PCP, all normal now per patient and lab in Epic. Also had thyroid and CBC checked 11/22/12 which was normal per lab reviewed in epic. Denies any fatigue or other health issues. Patient previously on hypertension medication but now off of after OCP changed to POP. Has noticed increase in vaginal discharge, no pelvic or abdominal pain. No other health issues. Patient working on weight loss with increase with exercise daily.  O: Healthy female WDWN Weight one year ago 193, 196 today. Affect: normal, orientation x 3  Exam: skin warm and dry Thyroid: normal, no nodules Abdomen: soft, no masses, non tender Pelvic exam: External genital area ; no lesions normla AVW:[id number] Vagina: grey discharge with slight odor  Wet prep taken Cervix: non tender, no blood noted Uterus: normal, anteflexed, non tender Adnexa: normal, non tender, no masses palpated Perineal: no lesions Wet Prep: positive BV, negative yeast, trich  A:Normal pelvic exam, no anemia per lab in chart 2-DUB episode X 2 on Camilla for contraception, bleeding consistent with POP profile 3-BV  P: Reviewed findings, reassured. 2-Discussed POP profile and changes they may occur with use, but has been a good choice due hypertension occurrence with OCP. Discussed if continues, can look at another progesterone option such as Mirena IUD. Given menses calendar to keep record with normal and abnormal parameters. Can also do Motrin 800 mg at onset of menses to see if first day bleeding is less. Advise if continues to occur next month. 3-Reviewed findings Rx Flagyl see  order. Schedule Aex. Can also re-evaluate then.  Rv prn, Reviewed, TL

## 2012-12-06 ENCOUNTER — Other Ambulatory Visit: Payer: Self-pay | Admitting: Certified Nurse Midwife

## 2012-12-06 NOTE — Telephone Encounter (Signed)
eScribe request for refill on CAMILA Last filled - 11/01/11 X 1 YEAR Last AEX - 11/01/11 Next AEX - 12/25/12 RX sent to pharmacy.

## 2012-12-25 ENCOUNTER — Encounter: Payer: Self-pay | Admitting: Certified Nurse Midwife

## 2012-12-25 ENCOUNTER — Ambulatory Visit: Payer: BC Managed Care – PPO | Admitting: Certified Nurse Midwife

## 2012-12-25 DIAGNOSIS — Z01419 Encounter for gynecological examination (general) (routine) without abnormal findings: Secondary | ICD-10-CM

## 2013-02-08 ENCOUNTER — Telehealth: Payer: Self-pay | Admitting: Certified Nurse Midwife

## 2013-02-08 NOTE — Telephone Encounter (Signed)
Patient is going to [place] in Oct. Wants to know if there is something that you can give her to stop her period while she is there. She will be there for a week.

## 2013-02-19 ENCOUNTER — Ambulatory Visit: Payer: BC Managed Care – PPO

## 2013-02-22 ENCOUNTER — Encounter: Payer: BC Managed Care – PPO | Admitting: Family Medicine

## 2013-02-22 ENCOUNTER — Ambulatory Visit: Payer: BC Managed Care – PPO | Admitting: Certified Nurse Midwife

## 2013-02-22 NOTE — Telephone Encounter (Signed)
Patient called today to cancel her AEX for today due to "a very heavy menstrual cycle." Patient reschedule for 02/28/13.

## 2013-02-26 ENCOUNTER — Ambulatory Visit (INDEPENDENT_AMBULATORY_CARE_PROVIDER_SITE_OTHER): Payer: BC Managed Care – PPO | Admitting: Family Medicine

## 2013-02-26 ENCOUNTER — Encounter: Payer: Self-pay | Admitting: Family Medicine

## 2013-02-26 VITALS — BP 135/90 | HR 78 | Temp 98.2°F | Resp 16 | Ht 62.0 in | Wt 201.0 lb

## 2013-02-26 DIAGNOSIS — J209 Acute bronchitis, unspecified: Secondary | ICD-10-CM

## 2013-02-26 DIAGNOSIS — J45909 Unspecified asthma, uncomplicated: Secondary | ICD-10-CM

## 2013-02-26 DIAGNOSIS — Z23 Encounter for immunization: Secondary | ICD-10-CM

## 2013-02-26 MED ORDER — HYDROCODONE-HOMATROPINE 5-1.5 MG/5ML PO SYRP: ORAL_SOLUTION | ORAL | Status: DC

## 2013-02-26 MED ORDER — PREDNISONE 20 MG PO TABS: ORAL_TABLET | ORAL | Status: DC

## 2013-02-26 MED ORDER — AZITHROMYCIN 250 MG PO TABS: ORAL_TABLET | ORAL | Status: DC

## 2013-02-26 MED ORDER — ALBUTEROL SULFATE HFA 108 (90 BASE) MCG/ACT IN AERS: 2.0000 | INHALATION_SPRAY | Freq: Four times a day (QID) | RESPIRATORY_TRACT | Status: DC | PRN

## 2013-02-26 MED ORDER — ALBUTEROL SULFATE (2.5 MG/3ML) 0.083% IN NEBU: 2.5000 mg | INHALATION_SOLUTION | Freq: Once | RESPIRATORY_TRACT | Status: DC

## 2013-02-26 NOTE — Assessment & Plan Note (Addendum)
Alb 2.5 neb in office today: Prednisone 40mg  qd x 5d, then 20mg  qd x 5d. Hycodan susp 1-2 tsp q6h prn. Azithromycin x 5d. Mucinex and nasonex as she is already doing. Inhaler education done by nurse today. Flu vaccine IM today.

## 2013-02-26 NOTE — Progress Notes (Signed)
OFFICE NOTE  02/26/2013  CC:  Chief Complaint  Patient presents with  . Cough    x 4 weeks     HPI: Patient is a 42 y.o. Caucasian female who is here for resp complaints. She does not smoke.  Reports constant coughing x 4 wks, mostly at night and when talking a lot. Mucinex helps.  Has this every fall.  No signif wheezing or SOB.  +nasal drainage/PND. Zyrtec off and on.  Nasonex qd.  Pertinent PMH:  Past Medical History  Diagnosis Date  . Hypertension   . Chicken pox as a child  . Hepatitis C carrier 02/07/2011  . Hx: UTI (urinary tract infection) 02/07/2011  . Abnormal cervical cytology 02/07/2011  . HTN (hypertension) 02/07/2011  . Obese 03/04/2011  . Tinea pedis of right foot 03/07/2011  . Psoriasis 09/02/2011  . Left lateral epicondylitis 09/02/2011  . Right shoulder strain 03/29/2012  . Hypokalemia 05/25/2012  . Bronchitis 05/25/2012  . Palpitations 07/08/2012  . Menorrhagia 11/24/2012   Past Surgical History  Procedure Laterality Date  . Dilation and curettage of uterus  2003    SAB    MEDS:  Mucinex, nasonex, micronor, zyrtec  PE: Blood pressure 135/90, pulse 78, temperature 98.2 F (36.8 C), temperature source Temporal, resp. rate 16, height 5\' 2"  (1.575 m), weight 201 lb (91.173 kg), last menstrual period 02/20/2013, SpO2 98.00%. Gen: Alert, well appearing.  Patient is oriented to person, place, time, and situation. Coughing (dry) nearly constantly. VS: noted--normal. Gen: alert, NAD, NONTOXIC APPEARING. HEENT: eyes without injection, drainage, or swelling.  Ears: EACs clear, TMs with normal light reflex and landmarks.  Nose: Clear rhinorrhea, with some dried, crusty exudate adherent to mildly injected mucosa.  No purulent d/c.  No paranasal sinus TTP.  No facial swelling.  Throat and mouth without focal lesion.  No pharyngial swelling, erythema, or exudate.   Neck: supple, no LAD.   LUNGS: CTA bilat, nonlabored resps.  Diminished aeration diffusely, particularly  on exhalation.  Post-exhalation coughing noted. CV: RRR, no m/r/g. EXT: no c/c/e SKIN: no rash  LAB: none today  IMPRESSION AND PLAN:  Asthmatic bronchitis Alb 2.5 neb in office today: Prednisone 40mg  qd x 5d, then 20mg  qd x 5d. Hycodan susp 1-2 tsp q6h prn. Azithromycin x 5d. Mucinex and nasonex as she is already doing. Inhaler education done by nurse today. Flu vaccine IM today.   An After Visit Summary was printed and given to the patient.  FOLLOW UP: prn

## 2013-02-26 NOTE — Addendum Note (Signed)
Addended by: Eulah Pont on: 02/26/2013 09:10 AM   Modules accepted: Orders

## 2013-02-28 ENCOUNTER — Encounter: Payer: Self-pay | Admitting: Certified Nurse Midwife

## 2013-02-28 ENCOUNTER — Ambulatory Visit (INDEPENDENT_AMBULATORY_CARE_PROVIDER_SITE_OTHER): Payer: BC Managed Care – PPO | Admitting: Certified Nurse Midwife

## 2013-02-28 VITALS — BP 120/82 | HR 64 | Resp 16 | Ht 62.25 in | Wt 206.0 lb

## 2013-02-28 DIAGNOSIS — Z01419 Encounter for gynecological examination (general) (routine) without abnormal findings: Secondary | ICD-10-CM

## 2013-02-28 DIAGNOSIS — Z309 Encounter for contraceptive management, unspecified: Secondary | ICD-10-CM

## 2013-02-28 MED ORDER — NORETHINDRONE 0.35 MG PO TABS: 1.0000 | ORAL_TABLET | Freq: Every day | ORAL | Status: DC

## 2013-02-28 NOTE — Patient Instructions (Addendum)

## 2013-02-28 NOTE — Progress Notes (Signed)
42 y.o. [id number] Married mixed race Fe here for annual exam. Periods more normal now with Micronor use. Hypertensions stable with POP use.  Having nausea this am due to medication for URI . Planning mission trip to [place] in next 2 months, has immunization update soon. Sees PCP aex, labs, prn. No other health issues.  Patient's last menstrual period was 02/19/2013.          Sexually active: yes  The current method of family planning is oral progesterone-only contraceptive.    Exercising: no  exercise Smoker:  no  Health Maintenance: Pap: 11-01-11 neg HPV HR neg MMG:  04-15-11 normal Colonoscopy:  none BMD:   none TDaP:  2008 Labs: none Self breast exam: done occ   reports that she has never smoked. She has never used smokeless tobacco. She reports that she does not drink alcohol or use illicit drugs.  Past Medical History  Diagnosis Date  . Hypertension   . Chicken pox as a child  . Hepatitis C carrier 02/07/2011  . Hx: UTI (urinary tract infection) 02/07/2011  . Abnormal cervical cytology 02/07/2011  . HTN (hypertension) 02/07/2011  . Obese 03/04/2011  . Tinea pedis of right foot 03/07/2011  . Psoriasis 09/02/2011  . Left lateral epicondylitis 09/02/2011  . Right shoulder strain 03/29/2012  . Hypokalemia 05/25/2012  . Bronchitis 05/25/2012  . Palpitations 07/08/2012  . Menorrhagia 11/24/2012    Past Surgical History  Procedure Laterality Date  . Dilation and curettage of uterus  2003    SAB    Current Outpatient Prescriptions  Medication Sig Dispense Refill  . albuterol (PROVENTIL HFA;VENTOLIN HFA) 108 (90 BASE) MCG/ACT inhaler Inhale 2 puffs into the lungs every 6 (six) hours as needed for wheezing.  1 Inhaler  0  . azithromycin (ZITHROMAX) 250 MG tablet 2 tabs po qd x 1d, then 1 tab po qd x 4d  6 each  0  . cetirizine (ZYRTEC) 10 MG tablet Take 1 tablet (10 mg total) by mouth daily as needed for allergies or rhinitis.  30 tablet  11  . guaiFENesin (MUCINEX) 600 MG 12 hr tablet  Take 1,200 mg by mouth as needed. X 10 days      . HYDROcodone-homatropine (HYCODAN) 5-1.5 MG/5ML syrup 1-2 tsp po q6h prn cough  120 mL  0  . mometasone (NASONEX) 50 MCG/ACT nasal spray Place 2 sprays into the nose daily.      . norethindrone (MICRONOR,CAMILA,ERRIN) 0.35 MG tablet TAKE 1 TABLET BY MOUTH EVERY DAY  28 tablet  0  . predniSONE (DELTASONE) 20 MG tablet 2 tabs po qd x 5d, then 1 tab po qd x 5d  15 tablet  0   No current facility-administered medications for this visit.    Family History  Problem Relation Age of Onset  . Diabetes Mother     type 2  . Heart attack Father   . Hypertension Father   . Cancer Paternal Grandmother     ?    ROS:  Pertinent items are noted in HPI.  Otherwise, a comprehensive ROS was negative.  Exam:   Ht 5' 2.25" (1.581 m)  Wt 206 lb (93.441 kg)  BMI 37.38 kg/m2  LMP 02/19/2013 Height: 5' 2.25" (158.1 cm)  Ht Readings from Last 3 Encounters:  02/28/13 5' 2.25" (1.581 m)  02/26/13 5\' 2"  (1.575 m)  12/04/12 5' 2.25" (1.581 m)    General appearance: alert, cooperative and appears stated age Head: Normocephalic, without obvious abnormality, atraumatic Neck: no  adenopathy, supple, symmetrical, trachea midline and thyroid normal to inspection and palpation Lungs: clear to auscultation bilaterally Breasts: normal appearance, no masses or tenderness, No nipple retraction or dimpling, No nipple discharge or bleeding, No axillary or supraclavicular adenopathy Heart: regular rate and rhythm Abdomen: soft, non-tender; no masses,  no organomegaly Extremities: extremities normal, atraumatic, no cyanosis or edema Skin: Skin color, texture, turgor normal. No rashes or lesions Lymph nodes: Cervical, supraclavicular, and axillary nodes normal. No abnormal inguinal nodes palpated Neurologic: Grossly normal   Pelvic: External genitalia:  no lesions              Urethra:  normal appearing urethra with no masses, tenderness or lesions               Bartholin's and Skene's: normal                 Vagina: normal appearing vagina with normal color and discharge, no lesions              Cervix: normal, non tender              Pap taken: no Bimanual Exam:  Uterus:  normal size, contour, position, consistency, mobility, non-tender and anteverted              Adnexa: normal adnexa and no mass, fullness, tenderness               Rectovaginal: Confirms               Anus:  normal sphincter tone, no lesions  A:  Well Woman with normal exam  Contraception POP due to hypertension  Hypertension stable without medication with discontinue of OCP  URI under treatment  Mission trip to [place]  P:   Reivewed health and wellness pertinent to exam  Rx Micronor see order  Continue to have random check of BP  Continue follow up as indicated  Make sure she is up to date prior to trip as indicated by [employer]  Pap smear as per guidelines   Mammogram yearly, stressed scheduling soon pap smear not taken today  counseled on breast self exam, mammography screening, adequate intake of calcium and vitamin D, diet and exercise  return annually or prn  An After Visit Summary was printed and given to the patient.

## 2013-03-04 NOTE — Progress Notes (Signed)
Note reviewed, agree with plan.  Christyanna Mckeon, MD  

## 2013-03-07 ENCOUNTER — Encounter (HOSPITAL_COMMUNITY): Payer: Self-pay | Admitting: *Deleted

## 2013-03-07 ENCOUNTER — Emergency Department (HOSPITAL_COMMUNITY)
Admission: EM | Admit: 2013-03-07 | Discharge: 2013-03-07 | Disposition: A | Discharge status: Home / Self Care | Payer: BC Managed Care – PPO | Attending: Emergency Medicine | Admitting: Emergency Medicine

## 2013-03-07 ENCOUNTER — Emergency Department (HOSPITAL_COMMUNITY): Payer: BC Managed Care – PPO

## 2013-03-07 ENCOUNTER — Telehealth: Payer: Self-pay | Admitting: Family Medicine

## 2013-03-07 DIAGNOSIS — R0609 Other forms of dyspnea: Secondary | ICD-10-CM | POA: Insufficient documentation

## 2013-03-07 DIAGNOSIS — R0989 Other specified symptoms and signs involving the circulatory and respiratory systems: Secondary | ICD-10-CM | POA: Insufficient documentation

## 2013-03-07 DIAGNOSIS — Z8739 Personal history of other diseases of the musculoskeletal system and connective tissue: Secondary | ICD-10-CM | POA: Insufficient documentation

## 2013-03-07 DIAGNOSIS — Z79899 Other long term (current) drug therapy: Secondary | ICD-10-CM | POA: Insufficient documentation

## 2013-03-07 DIAGNOSIS — B182 Chronic viral hepatitis C: Secondary | ICD-10-CM | POA: Insufficient documentation

## 2013-03-07 DIAGNOSIS — Z8742 Personal history of other diseases of the female genital tract: Secondary | ICD-10-CM | POA: Insufficient documentation

## 2013-03-07 DIAGNOSIS — Z87828 Personal history of other (healed) physical injury and trauma: Secondary | ICD-10-CM | POA: Insufficient documentation

## 2013-03-07 DIAGNOSIS — Z87440 Personal history of urinary (tract) infections: Secondary | ICD-10-CM | POA: Insufficient documentation

## 2013-03-07 DIAGNOSIS — R0602 Shortness of breath: Secondary | ICD-10-CM | POA: Insufficient documentation

## 2013-03-07 DIAGNOSIS — R0600 Dyspnea, unspecified: Secondary | ICD-10-CM

## 2013-03-07 DIAGNOSIS — Z86711 Personal history of pulmonary embolism: Secondary | ICD-10-CM | POA: Insufficient documentation

## 2013-03-07 DIAGNOSIS — Z8619 Personal history of other infectious and parasitic diseases: Secondary | ICD-10-CM | POA: Insufficient documentation

## 2013-03-07 DIAGNOSIS — IMO0002 Reserved for concepts with insufficient information to code with codable children: Secondary | ICD-10-CM | POA: Insufficient documentation

## 2013-03-07 DIAGNOSIS — I10 Essential (primary) hypertension: Secondary | ICD-10-CM | POA: Insufficient documentation

## 2013-03-07 DIAGNOSIS — J40 Bronchitis, not specified as acute or chronic: Secondary | ICD-10-CM | POA: Insufficient documentation

## 2013-03-07 DIAGNOSIS — L408 Other psoriasis: Secondary | ICD-10-CM | POA: Insufficient documentation

## 2013-03-07 DIAGNOSIS — E669 Obesity, unspecified: Secondary | ICD-10-CM | POA: Insufficient documentation

## 2013-03-07 DIAGNOSIS — N915 Oligomenorrhea, unspecified: Secondary | ICD-10-CM | POA: Insufficient documentation

## 2013-03-07 LAB — D-DIMER, QUANTITATIVE: D-Dimer, Quant: <0.27 ug/mL-FEU (ref 0.00–0.48)

## 2013-03-07 MED ORDER — SODIUM CHLORIDE 0.9 % IV BOLUS (SEPSIS)
1000.0000 mL | Freq: Once | INTRAVENOUS | Status: AC
  Administered 2013-03-07: 1000 mL via INTRAVENOUS

## 2013-03-07 MED ORDER — DM-GUAIFENESIN ER 30-600 MG PO TB12: 1.0000 | ORAL_TABLET | Freq: Two times a day (BID) | ORAL | Status: DC

## 2013-03-07 NOTE — Telephone Encounter (Signed)
FYI

## 2013-03-07 NOTE — ED Provider Notes (Signed)
CSN: [id number]     Arrival date & time 03/07/13  1529 History   First MD Initiated Contact with Patient 03/07/13 1638     Chief Complaint  Patient presents with  . Bronchitis  . Shortness of Breath   (Consider location/radiation/quality/duration/timing/severity/associated sxs/prior Treatment) Patient is a 42 y.o. female presenting with shortness of breath. The history is provided by the patient.  Shortness of Breath Severity:  Moderate Onset quality:  Gradual Timing:  Intermittent Progression:  Unchanged Chronicity:  New Context: activity and URI   Relieved by:  Rest Worsened by:  Activity and coughing Associated symptoms: cough and sputum production   Associated symptoms: no abdominal pain, no chest pain, no fever, no hemoptysis, no rash, no sore throat, no swollen glands, no vomiting and no wheezing   Risk factors: no family hx of DVT, no hx of PE/DVT and no recent surgery     Past Medical History  Diagnosis Date  . Hypertension   . Chicken pox as a child  . Hepatitis C carrier 02/07/2011  . Hx: UTI (urinary tract infection) 02/07/2011  . Abnormal cervical cytology 02/07/2011  . HTN (hypertension) 02/07/2011  . Obese 03/04/2011  . Tinea pedis of right foot 03/07/2011  . Psoriasis 09/02/2011  . Left lateral epicondylitis 09/02/2011  . Right shoulder strain 03/29/2012  . Hypokalemia 05/25/2012  . Bronchitis 05/25/2012  . Palpitations 07/08/2012  . Menorrhagia 11/24/2012  . Pulmonary embolism    Past Surgical History  Procedure Laterality Date  . Dilation and curettage of uterus  2003    SAB   Family History  Problem Relation Age of Onset  . Diabetes Mother     type 2  . Heart attack Father   . Hypertension Father   . Cancer Paternal Grandmother     ?   History  Substance Use Topics  . Smoking status: Never Smoker   . Smokeless tobacco: Never Used  . Alcohol Use: No     Comment: occasionaly- once every 4 months   OB History   Grav Para Term Preterm Abortions TAB  SAB Ect Mult Living   4 2   2  2   2      Review of Systems  Constitutional: Negative for fever.  HENT: Negative for congestion, sore throat and rhinorrhea.   Respiratory: Positive for cough, sputum production and shortness of breath. Negative for hemoptysis and wheezing.   Cardiovascular: Negative for chest pain and leg swelling.  Gastrointestinal: Negative for nausea, vomiting, abdominal pain and diarrhea.  Genitourinary: Negative for difficulty urinating.  Musculoskeletal: Negative for myalgias and arthralgias.  Skin: Negative for color change, pallor, rash and wound.  Neurological: Negative for weakness, light-headedness and numbness.  All other systems reviewed and are negative.    Allergies  Review of patient's allergies indicates no known allergies.  Home Medications   Current Outpatient Rx  Name  Route  Sig  Dispense  Refill  . albuterol (PROVENTIL HFA;VENTOLIN HFA) 108 (90 BASE) MCG/ACT inhaler   Inhalation   Inhale 2 puffs into the lungs every 6 (six) hours as needed for wheezing.   1 Inhaler   0     Dispense generic albuterol inhaler please   . cetirizine (ZYRTEC) 10 MG tablet   Oral   Take 1 tablet (10 mg total) by mouth daily as needed for allergies or rhinitis.   30 tablet   11   . mometasone (NASONEX) 50 MCG/ACT nasal spray   Nasal   Place 2 sprays  into the nose daily.         . norethindrone (MICRONOR,CAMILA,ERRIN) 0.35 MG tablet   Oral   Take 1 tablet (0.35 mg total) by mouth daily.   30 tablet   12   . predniSONE (DELTASONE) 20 MG tablet      2 tabs po qd x 5d, then 1 tab po qd x 5d   15 tablet   0   . dextromethorphan-guaiFENesin (MUCINEX DM) 30-600 MG per 12 hr tablet   Oral   Take 1 tablet by mouth every 12 (twelve) hours.   30 tablet   0    BP 138/84  Pulse 86  Temp(Src) 98.3 F (36.8 C) (Oral)  Resp 18  Ht 5\' 2"  (1.575 m)  Wt 199 lb (90.266 kg)  BMI 36.39 kg/m2  SpO2 100%  LMP 02/19/2013 Physical Exam  Constitutional:  She appears well-developed and well-nourished. No distress.  HENT:  Head: Normocephalic and atraumatic.  Mouth/Throat: No oropharyngeal exudate.  Eyes: Conjunctivae and EOM are normal. Pupils are equal, round, and reactive to light.  Neck: Normal range of motion. Neck supple.  Cardiovascular: Normal rate, regular rhythm and normal heart sounds.  Exam reveals no gallop and no friction rub.   No murmur heard. Pulmonary/Chest: Effort normal and breath sounds normal. No respiratory distress. She has no wheezes. She has no rales. She exhibits no tenderness.  Abdominal: Soft. She exhibits no distension and no mass. There is no tenderness. There is no rebound and no guarding.  Musculoskeletal: Normal range of motion. She exhibits no edema and no tenderness.  Lymphadenopathy:    She has no cervical adenopathy.  Skin: Skin is warm and dry. No rash noted. She is not diaphoretic.  Psychiatric: She has a normal mood and affect. Her behavior is normal. Judgment and thought content normal.    ED Course  Procedures (including critical care time) Labs Review Labs Reviewed  D-DIMER, QUANTITATIVE   Imaging Review Dg Chest 2 View  03/07/2013   CLINICAL DATA:  Cough, shortness of breath. History of bronchitis for several weeks per patient.  EXAM: CHEST  2 VIEW  COMPARISON:  None.  FINDINGS: Lungs are hypoaerated with crowding of the bronchovascular markings. This may account for superimposition of shadows over the right anterior 6th rib. Lungs are otherwise clear. No pleural effusion. Heart size normal. No pleural effusion. No acute osseous finding.  IMPRESSION: No active cardiopulmonary disease. Lung volumes are low with crowding of the bronchovascular markings at the lung bases. This may account for an apparent nodular opacity over the right lung base superimposed over the right anterior 6th rib, but this could be confirmed with followup PA chest radiograph obtained at full inspiration when the patient is  clinically able.   Electronically Signed   By: Christiana Pellant M.D.   On: 03/07/2013 16:23    MDM   1. Dyspnea   2. Bronchitis      Date: 03/07/2013  Rate: 122  Rhythm: sinus tachycardia  QRS Axis: normal  Intervals: normal  ST/T Wave abnormalities: nonspecific ST changes  Conduction Disutrbances:none  Narrative Interpretation:   Old EKG Reviewed: none available  Brittany Johnson is a 42 year old female with a history of hypertension as well as a recent diagnosis of bronchitis treated with prednisone as well as azithromycin continued shortness of breath. The patient was evaluated her PCP last week for 4 weeks of cough as well as dyspnea and she was started on azithromycin and prednisone. Since that time she  has had increasing dyspnea especially on exertion. She also endorses some atypical chest pain between her shoulder blades on the back that is nonpleuritic. She denies fevers virtually swelling. She denies recent surgeries or immobilization. She is on estrogen supplementation. She has not had a history of DVT.  On arrival patient is afebrile and tachycardic to 130s. She has no hypoxia on room air. She is in no respiratory distress and describes no dyspnea on presentation. Primary diagnosis is continued dyspnea secondary to bronchitis. Also the differential would be pulmonary embolism in this patient. She is tachycardic as well as dyspneic with occasional chest pains but no other risk factors. Will attempt to rule out by d-dimer. Chest x-ray obtained in triage is consolidations, no effusions, no pneumothorax. EKG is documented above is not concerned for a ACS and remainder of history and physical exam is also low risk for ACS or dissection.  Tachycardia improved with fluids. Dyspnea also improved. D-dimer negative and doubt PE. Afebrile and cxr clear so do not feel antibiotics are additionally indicated in this patient. Will add on dextromethorphan to mucinex for cough. To follow up with  PCP. Likely bronchitis and tachycardia induced dyspnea. Stable for d/c.  Discussed with the patient return precautions and need for follow up with PCP. Patient voiced understanding. Stable for d/c. This patient was discussed with my attending, Dr. Deretha Emory.   Dorna Leitz, MD 03/07/13 [phone]

## 2013-03-07 NOTE — ED Notes (Signed)
Pt reports recently being treated for bronchitis, has taken antibiotic and prednisone. Pt still feels like it is difficult to take a deep breath. Has hx of PE 6-7 months ago. ekg done at triage, airway intact, no resp distress noted.

## 2013-03-07 NOTE — Telephone Encounter (Signed)
Patient Information:  Caller Name: Brittany Johnson  Phone: [phone]  Patient: Brittany Johnson, Brittany Johnson  Gender: Female  DOB: 1971-01-02  Age: 42 Years  PCP: Danise [facility])  Pregnant: No  Office Follow Up:  Does the office need to follow up with this patient?: No  Instructions For The Office: N/A  RN Note:  History of PE in past.  Agreed to be seen in [facility] ED or UC now.  Symptoms  Reason For Call & Symptoms: Ongoing, minimally productive cough, feels winded (short of breath),  and  constant chest discomfort between shoulder blades for past 3 days.  Seen 02/26/13 for acute bronchitits. Finshed Zpack; completing Prednisone.  Reviewed Health History In EMR: Yes  Reviewed Medications In EMR: Yes  Reviewed Allergies In EMR: Yes  Reviewed Surgeries / Procedures: Yes  Date of Onset of Symptoms: 01/29/2013  Treatments Tried: Prednisone, Zpack, Albuterol MDI  Treatments Tried Worked: No OB / GYN:  LMP: 02/19/2013  Guideline(s) Used:  Cough  Disposition Per Guideline:   Go to ED Now  Reason For Disposition Reached:   Chest pain present when not coughing  Advice Given:  N/A  Patient Will Follow Care Advice:  YES

## 2013-03-14 NOTE — ED Provider Notes (Signed)
I saw and evaluated the patient, reviewed the resident's note and I agree with the findings and plan.  Patient seen by me improved in ED, symptoms c/w bronchitis. Patient being treated for bronchitis, but still feels SOB, past hx of PE about 6 months ago. Patient not in respiratory distress. D-dimer done as screen for PE and was negative. CXR without acute findings.   I agree with residents EKG interpretation.   Results for orders placed during the hospital encounter of 03/07/13  D-DIMER, QUANTITATIVE      Result Value Range   D-Dimer, Quant <0.27  0.00 - 0.48 ug/mL-FEU   Dg Chest 2 View  03/07/2013   CLINICAL DATA:  Cough, shortness of breath. History of bronchitis for several weeks per patient.  EXAM: CHEST  2 VIEW  COMPARISON:  None.  FINDINGS: Lungs are hypoaerated with crowding of the bronchovascular markings. This may account for superimposition of shadows over the right anterior 6th rib. Lungs are otherwise clear. No pleural effusion. Heart size normal. No pleural effusion. No acute osseous finding.  IMPRESSION: No active cardiopulmonary disease. Lung volumes are low with crowding of the bronchovascular markings at the lung bases. This may account for an apparent nodular opacity over the right lung base superimposed over the right anterior 6th rib, but this could be confirmed with followup PA chest radiograph obtained at full inspiration when the patient is clinically able.   Electronically Signed   By: Christiana Pellant M.D.   On: 03/07/2013 16:23      Shelda Jakes, MD 03/14/13 1028

## 2013-03-29 ENCOUNTER — Encounter: Payer: BC Managed Care – PPO | Admitting: Family Medicine

## 2013-03-29 DIAGNOSIS — Z0289 Encounter for other administrative examinations: Secondary | ICD-10-CM

## 2013-06-03 ENCOUNTER — Ambulatory Visit: Payer: BC Managed Care – PPO | Admitting: Family Medicine

## 2013-06-04 ENCOUNTER — Ambulatory Visit (INDEPENDENT_AMBULATORY_CARE_PROVIDER_SITE_OTHER): Payer: BC Managed Care – PPO | Admitting: Family Medicine

## 2013-06-04 ENCOUNTER — Encounter: Payer: Self-pay | Admitting: Family Medicine

## 2013-06-04 VITALS — BP 134/92 | HR 73 | Temp 98.9°F | Resp 18 | Ht 62.0 in | Wt 207.0 lb

## 2013-06-04 DIAGNOSIS — J019 Acute sinusitis, unspecified: Secondary | ICD-10-CM | POA: Insufficient documentation

## 2013-06-04 MED ORDER — AMOXICILLIN 875 MG PO TABS: 875.0000 mg | ORAL_TABLET | Freq: Two times a day (BID) | ORAL | Status: DC

## 2013-06-04 NOTE — Progress Notes (Signed)
Pre visit review using our clinic review tool, if applicable. No additional management support is needed unless otherwise documented below in the visit note. 

## 2013-06-04 NOTE — Progress Notes (Signed)
OFFICE NOTE  06/04/2013  CC:  Chief Complaint  Patient presents with  . Cough    early last week      HPI: Patient is a 42 y.o. Caucasian female who is here for cough. Nonsmoker. Onset 1 wk ago: nasal congestion/runny nose, sinus HA, now with about 3-4 d of coughing. No fever.  Mucous coming up some with use of mucinex.  No wheezing or tightness in chest. No signif body aches. She did have flu vaccine this season.  Last time I saw her I treated her with albut HFA + prednisone and she feels like one (or both) of these meds caused tachycardia--she went to ED for this and was given IVF for dehydration.   Pertinent PMH:  Past Medical History  Diagnosis Date  . Hypertension   . Chicken pox as a child  . Hepatitis C carrier 02/07/2011  . Hx: UTI (urinary tract infection) 02/07/2011  . Abnormal cervical cytology 02/07/2011  . HTN (hypertension) 02/07/2011  . Obese 03/04/2011  . Tinea pedis of right foot 03/07/2011  . Psoriasis 09/02/2011  . Left lateral epicondylitis 09/02/2011  . Right shoulder strain 03/29/2012  . Hypokalemia 05/25/2012  . Bronchitis 05/25/2012  . Palpitations 07/08/2012  . Menorrhagia 11/24/2012  . Pulmonary embolism    Past surgical, social, and family history reviewed and no changes noted since last office visit.  MEDS:  Outpatient Prescriptions Prior to Visit  Medication Sig Dispense Refill  . cetirizine (ZYRTEC) 10 MG tablet Take 1 tablet (10 mg total) by mouth daily as needed for allergies or rhinitis.  30 tablet  11  . dextromethorphan-guaiFENesin (MUCINEX DM) 30-600 MG per 12 hr tablet Take 1 tablet by mouth every 12 (twelve) hours.  30 tablet  0  . mometasone (NASONEX) 50 MCG/ACT nasal spray Place 2 sprays into the nose daily.      . norethindrone (MICRONOR,CAMILA,ERRIN) 0.35 MG tablet Take 1 tablet (0.35 mg total) by mouth daily.  30 tablet  12  . albuterol (PROVENTIL HFA;VENTOLIN HFA) 108 (90 BASE) MCG/ACT inhaler Inhale 2 puffs into the lungs every 6  (six) hours as needed for wheezing.  1 Inhaler  0  . predniSONE (DELTASONE) 20 MG tablet 2 tabs po qd x 5d, then 1 tab po qd x 5d  15 tablet  0   No facility-administered medications prior to visit.  **Not on pred or albut as listed above  PE: Blood pressure 134/92, pulse 73, temperature 98.9 F (37.2 C), temperature source Temporal, resp. rate 18, height 5\' 2"  (1.575 m), weight 207 lb (93.895 kg), last menstrual period 06/01/2013, SpO2 99.00%. VS: noted--normal. Gen: alert, NAD, NONTOXIC APPEARING. HEENT: eyes without injection, drainage, or swelling.  Ears: EACs clear, TMs with normal light reflex and landmarks.  Nose: Clear rhinorrhea, with some dried, crusty exudate adherent to mildly injected mucosa.  No purulent d/c.  No paranasal sinus TTP.  No facial swelling.  Throat and mouth without focal lesion.  No pharyngial swelling, erythema, or exudate.   Neck: supple, no LAD.   LUNGS: CTA bilat, nonlabored resps.   CV: RRR, no m/r/g. EXT: no c/c/e SKIN: no rash    IMPRESSION AND PLAN:  URI, gradually worsening.  PND cough.  No sign of asthmatic bronchitis. Will rx amoxil 875mg  bid x 10d. Saline nasal spray. Mucinex DM or robit DM OTC. She has hycodan left over from last resp illness that she'll use hs.  An After Visit Summary was printed and given to the patient.  FOLLOW UP:  prn

## 2013-09-01 ENCOUNTER — Encounter (HOSPITAL_BASED_OUTPATIENT_CLINIC_OR_DEPARTMENT_OTHER): Payer: Self-pay | Admitting: Emergency Medicine

## 2013-09-01 ENCOUNTER — Emergency Department (HOSPITAL_BASED_OUTPATIENT_CLINIC_OR_DEPARTMENT_OTHER)
Admission: EM | Admit: 2013-09-01 | Discharge: 2013-09-01 | Disposition: A | Discharge status: Home / Self Care | Payer: BC Managed Care – PPO | Attending: Emergency Medicine | Admitting: Emergency Medicine

## 2013-09-01 ENCOUNTER — Emergency Department (HOSPITAL_BASED_OUTPATIENT_CLINIC_OR_DEPARTMENT_OTHER): Payer: BC Managed Care – PPO

## 2013-09-01 DIAGNOSIS — Z8709 Personal history of other diseases of the respiratory system: Secondary | ICD-10-CM | POA: Insufficient documentation

## 2013-09-01 DIAGNOSIS — S335XXA Sprain of ligaments of lumbar spine, initial encounter: Secondary | ICD-10-CM | POA: Insufficient documentation

## 2013-09-01 DIAGNOSIS — Z872 Personal history of diseases of the skin and subcutaneous tissue: Secondary | ICD-10-CM | POA: Insufficient documentation

## 2013-09-01 DIAGNOSIS — Y929 Unspecified place or not applicable: Secondary | ICD-10-CM | POA: Insufficient documentation

## 2013-09-01 DIAGNOSIS — Z8619 Personal history of other infectious and parasitic diseases: Secondary | ICD-10-CM | POA: Insufficient documentation

## 2013-09-01 DIAGNOSIS — X500XXA Overexertion from strenuous movement or load, initial encounter: Secondary | ICD-10-CM | POA: Insufficient documentation

## 2013-09-01 DIAGNOSIS — Z79899 Other long term (current) drug therapy: Secondary | ICD-10-CM | POA: Insufficient documentation

## 2013-09-01 DIAGNOSIS — Z8739 Personal history of other diseases of the musculoskeletal system and connective tissue: Secondary | ICD-10-CM | POA: Insufficient documentation

## 2013-09-01 DIAGNOSIS — E669 Obesity, unspecified: Secondary | ICD-10-CM | POA: Insufficient documentation

## 2013-09-01 DIAGNOSIS — Z87440 Personal history of urinary (tract) infections: Secondary | ICD-10-CM | POA: Insufficient documentation

## 2013-09-01 DIAGNOSIS — Z8742 Personal history of other diseases of the female genital tract: Secondary | ICD-10-CM | POA: Insufficient documentation

## 2013-09-01 DIAGNOSIS — IMO0002 Reserved for concepts with insufficient information to code with codable children: Secondary | ICD-10-CM | POA: Insufficient documentation

## 2013-09-01 DIAGNOSIS — I10 Essential (primary) hypertension: Secondary | ICD-10-CM | POA: Insufficient documentation

## 2013-09-01 DIAGNOSIS — Z86711 Personal history of pulmonary embolism: Secondary | ICD-10-CM | POA: Insufficient documentation

## 2013-09-01 DIAGNOSIS — Z792 Long term (current) use of antibiotics: Secondary | ICD-10-CM | POA: Insufficient documentation

## 2013-09-01 DIAGNOSIS — Y9389 Activity, other specified: Secondary | ICD-10-CM | POA: Insufficient documentation

## 2013-09-01 DIAGNOSIS — T148XXA Other injury of unspecified body region, initial encounter: Secondary | ICD-10-CM

## 2013-09-01 MED ORDER — HYDROCODONE-ACETAMINOPHEN 5-325 MG PO TABS: 2.0000 | ORAL_TABLET | ORAL | Status: DC | PRN

## 2013-09-01 MED ORDER — METHOCARBAMOL 500 MG PO TABS: 500.0000 mg | ORAL_TABLET | Freq: Two times a day (BID) | ORAL | Status: DC

## 2013-09-01 MED ORDER — IBUPROFEN 800 MG PO TABS: 800.0000 mg | ORAL_TABLET | Freq: Three times a day (TID) | ORAL | Status: DC

## 2013-09-01 MED ORDER — HYDROCODONE-ACETAMINOPHEN 5-325 MG PO TABS
2.0000 | ORAL_TABLET | Freq: Once | ORAL | Status: AC
  Administered 2013-09-01: 2 via ORAL
  Filled 2013-09-01: qty 2

## 2013-09-01 NOTE — Discharge Instructions (Signed)

## 2013-09-01 NOTE — ED Notes (Signed)
C/o onset of mid back pain that radiates around to the front of her chest after being adjusted by a chiropractor.   Pain is worse with movement, breathing.  Denies chest pain, SHOB.

## 2013-09-01 NOTE — ED Notes (Signed)
Patient took muscle relaxer & alieve at 9:00 but no relief

## 2013-09-01 NOTE — ED Provider Notes (Signed)
CSN: [id number]     Arrival date & time 09/01/13  1250 History   First MD Initiated Contact with Patient 09/01/13 1348     Chief Complaint  Patient presents with  . Back Pain     (Consider location/radiation/quality/duration/timing/severity/associated sxs/prior Treatment) Patient is a 43 y.o. female presenting with back pain. The history is provided by the patient. No language interpreter was used.  Back Pain Location:  Lumbar spine Quality:  Aching Radiates to:  Does not radiate Pain severity:  Moderate Onset quality:  Sudden Timing:  Constant Progression:  Worsening Chronicity:  New Relieved by:  Nothing Worsened by:  Nothing tried Ineffective treatments:  None tried Associated symptoms: no abdominal pain and no leg pain   Pt reports chiropractor pushed on her back and she had a pop in her back.  Pt complains of pain in back  Past Medical History  Diagnosis Date  . Hypertension   . Chicken pox as a child  . Hepatitis C carrier 02/07/2011  . Hx: UTI (urinary tract infection) 02/07/2011  . Abnormal cervical cytology 02/07/2011  . HTN (hypertension) 02/07/2011  . Obese 03/04/2011  . Tinea pedis of right foot 03/07/2011  . Psoriasis 09/02/2011  . Left lateral epicondylitis 09/02/2011  . Right shoulder strain 03/29/2012  . Hypokalemia 05/25/2012  . Bronchitis 05/25/2012  . Palpitations 07/08/2012  . Menorrhagia 11/24/2012  . Pulmonary embolism    Past Surgical History  Procedure Laterality Date  . Dilation and curettage of uterus  2003    SAB   Family History  Problem Relation Age of Onset  . Diabetes Mother     type 2  . Heart attack Father   . Hypertension Father   . Cancer Paternal Grandmother     ?   History  Substance Use Topics  . Smoking status: Never Smoker   . Smokeless tobacco: Never Used  . Alcohol Use: No     Comment: occasionaly- once every 4 months   OB History   Grav Para Term Preterm Abortions TAB SAB Ect Mult Living   4 2   2  2   2       Review of Systems  Gastrointestinal: Negative for abdominal pain.  Musculoskeletal: Positive for back pain.  All other systems reviewed and are negative.      Allergies  Review of patient's allergies indicates no known allergies.  Home Medications   Current Outpatient Rx  Name  Route  Sig  Dispense  Refill  . albuterol (PROVENTIL HFA;VENTOLIN HFA) 108 (90 BASE) MCG/ACT inhaler   Inhalation   Inhale 2 puffs into the lungs every 6 (six) hours as needed for wheezing.   1 Inhaler   0     Dispense generic albuterol inhaler please   . amoxicillin (AMOXIL) 875 MG tablet   Oral   Take 1 tablet (875 mg total) by mouth 2 (two) times daily.   20 tablet   0   . cetirizine (ZYRTEC) 10 MG tablet   Oral   Take 1 tablet (10 mg total) by mouth daily as needed for allergies or rhinitis.   30 tablet   11   . dextromethorphan-guaiFENesin (MUCINEX DM) 30-600 MG per 12 hr tablet   Oral   Take 1 tablet by mouth every 12 (twelve) hours.   30 tablet   0   . mometasone (NASONEX) 50 MCG/ACT nasal spray   Nasal   Place 2 sprays into the nose daily.         Marland Kitchen  norethindrone (MICRONOR,CAMILA,ERRIN) 0.35 MG tablet   Oral   Take 1 tablet (0.35 mg total) by mouth daily.   30 tablet   12   . predniSONE (DELTASONE) 20 MG tablet      2 tabs po qd x 5d, then 1 tab po qd x 5d   15 tablet   0    BP 161/76  Pulse 84  Temp(Src) 98.6 F (37 C) (Oral)  Resp 16  Ht 5\' 2"  (1.575 m)  Wt 190 lb (86.183 kg)  BMI 34.74 kg/m2  SpO2 100%  LMP 08/18/2013 Physical Exam  Nursing note and vitals reviewed. Constitutional: She is oriented to person, place, and time. She appears well-developed and well-nourished.  HENT:  Head: Normocephalic.  Right Ear: External ear normal.  Left Ear: External ear normal.  Eyes: Conjunctivae and EOM are normal. Pupils are equal, round, and reactive to light.  Neck: Normal range of motion.  Cardiovascular: Normal rate and normal heart sounds.    Pulmonary/Chest: Effort normal and breath sounds normal.  Abdominal: Soft.  Musculoskeletal: She exhibits no edema and no tenderness.  Neurological: She is alert and oriented to person, place, and time.  Skin: Skin is warm.    ED Course  Procedures (including critical care time) Labs Review Labs Reviewed - No data to display Imaging Review Dg Chest 2 View  09/01/2013   CLINICAL DATA:  43 year old female with chest pain.  EXAM: CHEST  2 VIEW  COMPARISON:  02/04/2013  FINDINGS: The cardiomediastinal silhouette is unremarkable.  There is no evidence of focal airspace disease, pulmonary edema, suspicious pulmonary nodule/mass, pleural effusion, or pneumothorax. No acute bony abnormalities are identified.  IMPRESSION: No active cardiopulmonary disease.   Electronically Signed   By: Hassan Rowan M.D.   On: 09/01/2013 15:10     EKG Interpretation None      MDM   Final diagnoses:  Muscle strain    No pneumonothorax, no obvious rib fractures.      [place], PA-C 09/01/13 2358

## 2013-09-02 NOTE — ED Provider Notes (Signed)
Medical screening examination/treatment/procedure(s) were performed by non-physician practitioner and as supervising physician I was immediately available for consultation/collaboration.   EKG Interpretation None        Neta Ehlers, MD 09/02/13 1501

## 2013-09-06 ENCOUNTER — Ambulatory Visit: Payer: BC Managed Care – PPO | Admitting: Family Medicine

## 2013-09-06 ENCOUNTER — Ambulatory Visit (INDEPENDENT_AMBULATORY_CARE_PROVIDER_SITE_OTHER): Payer: BC Managed Care – PPO | Admitting: Family Medicine

## 2013-09-06 ENCOUNTER — Encounter: Payer: Self-pay | Admitting: Family Medicine

## 2013-09-06 ENCOUNTER — Other Ambulatory Visit: Payer: Self-pay

## 2013-09-06 VITALS — BP 154/91 | HR 66 | Temp 98.4°F | Resp 18 | Ht 62.0 in | Wt 208.0 lb

## 2013-09-06 DIAGNOSIS — R079 Chest pain, unspecified: Secondary | ICD-10-CM

## 2013-09-06 DIAGNOSIS — R0781 Pleurodynia: Secondary | ICD-10-CM

## 2013-09-06 DIAGNOSIS — IMO0002 Reserved for concepts with insufficient information to code with codable children: Secondary | ICD-10-CM

## 2013-09-06 DIAGNOSIS — R0789 Other chest pain: Secondary | ICD-10-CM

## 2013-09-06 DIAGNOSIS — S2341XA Sprain of ribs, initial encounter: Secondary | ICD-10-CM

## 2013-09-06 DIAGNOSIS — S29011A Strain of muscle and tendon of front wall of thorax, initial encounter: Secondary | ICD-10-CM

## 2013-09-06 DIAGNOSIS — Z1231 Encounter for screening mammogram for malignant neoplasm of breast: Secondary | ICD-10-CM

## 2013-09-06 NOTE — Progress Notes (Signed)
OFFICE NOTE  09/06/2013  CC:  Chief Complaint  Patient presents with  . Hospitalization Follow-up     HPI: Patient is a 43 y.o. Caucasian female who is here for f/u after going to the ED on 09/01/13 for right midback pain. Right shoulder blade area and a bit further down, also right side of anterior chest at lowest ribs--onset after a chiropracter visit--total duration at this point is 8-10 days---it has peaked but is still significant. She was going to the chiropracter for low back pain and hip pain. At the ED a chest x-ray was unremarkable and she was dx'd with musculoskeletal pain/strain and she was rx'd vicodin, ibuprofen 800mg , and robaxin and she says none of these help much.  Currently: describes pain in lower right rib area anterior, laterally, and posteriorly.  Also sometimes pain up right side of back to shoulder blade region.  It is constant, made worse by certain movements of her trunk/arms. Better lying down in certain positions.  No sx's in arms or legs.  No SOB, no cough, no fevers. No abd pain, no rash, no nausea or vomiting.    Pertinent PMH:  Past medical, surgical, social, and family history reviewed and no changes are noted since last office visit.  MEDS:  Outpatient Prescriptions Prior to Visit  Medication Sig Dispense Refill  . cetirizine (ZYRTEC) 10 MG tablet Take 1 tablet (10 mg total) by mouth daily as needed for allergies or rhinitis.  30 tablet  11  . HYDROcodone-acetaminophen (NORCO/VICODIN) 5-325 MG per tablet Take 2 tablets by mouth every 4 (four) hours as needed.  10 tablet  0  . ibuprofen (ADVIL,MOTRIN) 800 MG tablet Take 1 tablet (800 mg total) by mouth 3 (three) times daily.  21 tablet  0  . methocarbamol (ROBAXIN) 500 MG tablet Take 1 tablet (500 mg total) by mouth 2 (two) times daily.  20 tablet  0  . norethindrone (MICRONOR,CAMILA,ERRIN) 0.35 MG tablet Take 1 tablet (0.35 mg total) by mouth daily.  30 tablet  12  . albuterol (PROVENTIL HFA;VENTOLIN  HFA) 108 (90 BASE) MCG/ACT inhaler Inhale 2 puffs into the lungs every 6 (six) hours as needed for wheezing.  1 Inhaler  0  . dextromethorphan-guaiFENesin (MUCINEX DM) 30-600 MG per 12 hr tablet Take 1 tablet by mouth every 12 (twelve) hours.  30 tablet  0  . mometasone (NASONEX) 50 MCG/ACT nasal spray Place 2 sprays into the nose daily.      Marland Kitchen amoxicillin (AMOXIL) 875 MG tablet Take 1 tablet (875 mg total) by mouth 2 (two) times daily.  20 tablet  0  . predniSONE (DELTASONE) 20 MG tablet 2 tabs po qd x 5d, then 1 tab po qd x 5d  15 tablet  0   No facility-administered medications prior to visit.    PE: Blood pressure 154/91, pulse 66, temperature 98.4 F (36.9 C), temperature source Temporal, resp. rate 18, height 5\' 2"  (1.575 m), weight 208 lb (94.348 kg), last menstrual period 08/18/2013, SpO2 100.00%. Gen: Alert, well appearing.  Patient is oriented to person, place, time, and situation. AFFECT: pleasant, lucid thought and speech.  However, at times during the exam she shed tears due to tenderness to palpation and said she felt very frustrated and tired due to the pain. CV: RRR, no m/r/g.   LUNGS: CTA bilat, nonlabored resps, good aeration in all lung fields. ABD: soft, NT, ND, BS normal.  No hepatospenomegaly or mass.  No bruits.  She has significant tenderness along  the lower ribs on right side--posteriorly, laterally, and anteriorly.  No tenderness of spinous processes or facet regions.  No crepitus, no bruising or rash.   IMPRESSION AND PLAN:  Musculoskeletal chest wall pain, right side--suspect intercostal strain but her level of discomfort is out of proportion to what I'm used to seeing with an injury like this and her sx's are staying pretty intense for a good duration of time now.  Discussed options with pt, including PT, stronger pain meds, a brief course of prednisone, further imaging with CT (now or later if no better), and in the end she declined all of these.    We decided  to simply continue NSAIDs, add heating pad use, check a right sided rib x-ray and T spine x-ray, and ask for second opinion with Dr. Tamala Julian in Sports Medicine.   I ordered this referral today.  Spent 25 min with pt today, with >50% of this time spent in counseling and care coordination regarding the above problems.   An After Visit Summary was printed and given to the patient.  FOLLOW UP: prn

## 2013-09-06 NOTE — Progress Notes (Signed)
Pre visit review using our clinic review tool, if applicable. No additional management support is needed unless otherwise documented below in the visit note. 

## 2013-09-09 ENCOUNTER — Other Ambulatory Visit: Payer: Self-pay | Admitting: Family Medicine

## 2013-09-09 ENCOUNTER — Ambulatory Visit (HOSPITAL_COMMUNITY)
Admission: RE | Admit: 2013-09-09 | Discharge: 2013-09-09 | Disposition: A | Discharge status: Home / Self Care | Payer: BC Managed Care – PPO | Source: Ambulatory Visit | Attending: Family Medicine | Admitting: Family Medicine

## 2013-09-09 DIAGNOSIS — S29011A Strain of muscle and tendon of front wall of thorax, initial encounter: Secondary | ICD-10-CM

## 2013-09-09 DIAGNOSIS — R0781 Pleurodynia: Secondary | ICD-10-CM

## 2013-09-09 DIAGNOSIS — M545 Low back pain, unspecified: Secondary | ICD-10-CM | POA: Insufficient documentation

## 2013-09-09 DIAGNOSIS — IMO0002 Reserved for concepts with insufficient information to code with codable children: Secondary | ICD-10-CM

## 2013-09-09 DIAGNOSIS — R079 Chest pain, unspecified: Secondary | ICD-10-CM | POA: Insufficient documentation

## 2013-09-09 DIAGNOSIS — M546 Pain in thoracic spine: Secondary | ICD-10-CM | POA: Insufficient documentation

## 2013-09-11 ENCOUNTER — Ambulatory Visit: Payer: BC Managed Care – PPO | Admitting: Family Medicine

## 2013-09-12 ENCOUNTER — Encounter: Payer: Self-pay | Admitting: Family Medicine

## 2013-09-12 ENCOUNTER — Ambulatory Visit (INDEPENDENT_AMBULATORY_CARE_PROVIDER_SITE_OTHER): Payer: BC Managed Care – PPO | Admitting: Family Medicine

## 2013-09-12 VITALS — BP 140/88 | HR 73 | Ht 62.0 in | Wt 202.0 lb

## 2013-09-12 DIAGNOSIS — M948X9 Other specified disorders of cartilage, unspecified sites: Secondary | ICD-10-CM

## 2013-09-12 DIAGNOSIS — M940 Chondrocostal junction syndrome [Tietze]: Secondary | ICD-10-CM

## 2013-09-12 DIAGNOSIS — M999 Biomechanical lesion, unspecified: Secondary | ICD-10-CM

## 2013-09-12 NOTE — Assessment & Plan Note (Signed)
Decision today to treat with OMT was based on Physical Exam  After verbal consent patient was treated with indirect techniques in rib areas  Patient tolerated the procedure well with improvement in symptoms  Patient given exercises, stretches and lifestyle modifications  See medications in patient instructions if given  Patient will follow up in 2 weeks

## 2013-09-12 NOTE — Patient Instructions (Signed)
Good to meet you Bring monitor up to eye level Tennis ball between shoulder blades Posture on wall heels, butt, shoulders and head on wall for goal of 5 minutes daily.  Ice 20 minutes at end of day When in pain try the topical medication Vitamin D 2000 IU daily Come back in 2 weeks .

## 2013-09-12 NOTE — Progress Notes (Signed)
  [facility] [place], [place] [address] Phone: [phone] Subjective:    I'm seeing this patient by the request  of:  Dr. Anitra Lauth.   CC: Right sided rib pain  [id number] Brittany Johnson is a 43 y.o. female coming in with complaint of right-sided rib pain. Patient states that this started approximately 2 weeks ago. Patient did do a rowing machine and then went and saw a chiropractor. Right after the chiropractor she had intense pain on the posterior lateral aspect of her right shoulder blade. Patient states since that time she continues to have more of a centrally located area of tenderness. Patient states for a while she was having such pain that it is difficult to breathe. Patient denies any lower to be swelling, denies any association with food, denies any recent colds. Patient states that the pain seems to be fairly localized in the posterior aspect and seems to radiate around the right side. Patient states she can touch her to make the pain worse. Patient describes the pain as more of a sharp pain when it occurs and can be as bad as 9/10 in severity it does not respond well to over-the-counter medications. Patient states over time it does seem to be getting better very slowly but from time to time as the catches her which makes her very anxious. Denies nighttime awakening  Patient did see another provider is concern for an intercostal muscle strain and did get x-rays. X-rays were reviewed by me and thoracic spine x-rays do not show any bony abnormality. No sign of fracture.    Past medical history, social, surgical and family history all reviewed in electronic medical record.   Review of Systems: No headache, visual changes, nausea, vomiting, diarrhea, constipation, dizziness, abdominal pain, skin rash, fevers, chills, night sweats, weight loss, swollen lymph nodes, body aches, joint swelling, muscle aches, chest pain, shortness of breath, mood  changes.   Objective Weight 202 lb (91.627 kg), last menstrual period 08/18/2013.  General: No apparent distress alert and oriented x3 mood and affect normal, dressed appropriately. Overweight HEENT: Pupils equal, extraocular movements intact  Respiratory: Patient's speak in full sentences and does not appear short of breath  Cardiovascular: No lower extremity edema, non tender, no erythema  Skin: Warm dry intact with no signs of infection or rash on extremities or on axial skeleton.  Abdomen: Soft nontender  Neuro: Cranial nerves II through XII are intact, neurovascularly intact in all extremities with 2+ DTRs and 2+ pulses.  Lymph: No lymphadenopathy of posterior or anterior cervical chain or axillae bilaterally.  Gait normal with good balance and coordination.  MSK:  Non tender with full range of motion and good stability and symmetric strength and tone of shoulders, elbows, wrist, hip, knee and ankles bilaterally.  Osteopathic findings. Patient does have a slipped rib of T5 on the right side this document inhaled position. T5 is also extended rotated inside that right   Impression and Recommendations:     This case required medical decision making of moderate complexity.

## 2013-09-12 NOTE — Assessment & Plan Note (Signed)
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 Patient is making some improvement and did have some very mild indirect osteopathic manipulation which was helpful. Discussed over-the-counter medications as well as supplements that could be beneficial. Discussed icing protocol and given and topical medication in case pain and severe. Warned of such things as shortness of breath or associations with fatty foods that would need further workup by a physician immediately. Patient given more postural exercises and hopefully help throughout the day. Patient will come back again in 2 weeks for further evaluation.

## 2013-09-20 ENCOUNTER — Ambulatory Visit: Payer: BC Managed Care – PPO

## 2013-09-24 ENCOUNTER — Ambulatory Visit: Payer: BC Managed Care – PPO | Admitting: Family Medicine

## 2013-09-27 ENCOUNTER — Ambulatory Visit: Payer: BC Managed Care – PPO

## 2013-10-04 ENCOUNTER — Ambulatory Visit: Payer: BC Managed Care – PPO

## 2013-10-10 ENCOUNTER — Telehealth: Payer: Self-pay | Admitting: Family Medicine

## 2013-10-10 ENCOUNTER — Ambulatory Visit
Admission: RE | Admit: 2013-10-10 | Discharge: 2013-10-10 | Disposition: A | Discharge status: Home / Self Care | Payer: BC Managed Care – PPO | Source: Ambulatory Visit

## 2013-10-10 DIAGNOSIS — Z1231 Encounter for screening mammogram for malignant neoplasm of breast: Secondary | ICD-10-CM

## 2013-10-10 NOTE — Telephone Encounter (Signed)
HCTZ is not in pt's current med list.  Please advise.

## 2013-10-10 NOTE — Telephone Encounter (Signed)
Refill-hydrocholorothiazide

## 2013-10-10 NOTE — Telephone Encounter (Signed)
Left a detailed message for patient to return my call  

## 2013-10-10 NOTE — Telephone Encounter (Signed)
I agree she has had it in the past but the last time I saw her she said she was not taking it and her pressure was OK so we did not restart it. Why does she want it now and when is she coming in again, I have not seen her since June 2014

## 2013-10-16 NOTE — Telephone Encounter (Signed)
Pt has follow up in August. Left detailed message to call back with reason for HCTZ request.

## 2013-10-28 ENCOUNTER — Telehealth: Payer: Self-pay | Admitting: Family Medicine

## 2013-10-28 NOTE — Telephone Encounter (Signed)
Left a detailed message asking patient to return my call and let me know if she is still taking this

## 2013-10-28 NOTE — Telephone Encounter (Signed)
Refill- hydrochlorothiazide ° °[facility] [address] °

## 2013-12-05 ENCOUNTER — Encounter: Payer: Self-pay | Admitting: Family Medicine

## 2013-12-26 ENCOUNTER — Encounter: Payer: Self-pay | Admitting: Family Medicine

## 2014-01-13 ENCOUNTER — Encounter: Payer: BC Managed Care – PPO | Admitting: Family Medicine

## 2014-01-23 ENCOUNTER — Ambulatory Visit: Payer: BC Managed Care – PPO | Admitting: Family Medicine

## 2014-02-19 ENCOUNTER — Telehealth: Payer: Self-pay | Admitting: *Deleted

## 2014-02-19 ENCOUNTER — Ambulatory Visit: Payer: BC Managed Care – PPO | Admitting: Family Medicine

## 2014-02-19 DIAGNOSIS — Z0289 Encounter for other administrative examinations: Secondary | ICD-10-CM

## 2014-02-19 NOTE — Telephone Encounter (Signed)
Pt did not show for appointment 02/19/2014 at 10:45am for follow up on meds.

## 2014-02-19 NOTE — Telephone Encounter (Signed)
She can wait til January for her appt but I cannot do any more refills til seen

## 2014-03-14 ENCOUNTER — Ambulatory Visit: Payer: BC Managed Care – PPO | Admitting: Certified Nurse Midwife

## 2014-03-31 ENCOUNTER — Ambulatory Visit (INDEPENDENT_AMBULATORY_CARE_PROVIDER_SITE_OTHER): Payer: BC Managed Care – PPO | Admitting: Family Medicine

## 2014-03-31 ENCOUNTER — Encounter: Payer: Self-pay | Admitting: Family Medicine

## 2014-03-31 VITALS — BP 131/86 | HR 97 | Temp 98.0°F | Resp 18 | Ht 62.0 in | Wt 209.0 lb

## 2014-03-31 DIAGNOSIS — J069 Acute upper respiratory infection, unspecified: Secondary | ICD-10-CM

## 2014-03-31 DIAGNOSIS — R062 Wheezing: Secondary | ICD-10-CM

## 2014-03-31 DIAGNOSIS — Z23 Encounter for immunization: Secondary | ICD-10-CM

## 2014-03-31 DIAGNOSIS — J209 Acute bronchitis, unspecified: Secondary | ICD-10-CM

## 2014-03-31 MED ORDER — HYDROCODONE-HOMATROPINE 5-1.5 MG/5ML PO SYRP: ORAL_SOLUTION | ORAL | Status: DC

## 2014-03-31 MED ORDER — BENZONATATE 200 MG PO CAPS: 200.0000 mg | ORAL_CAPSULE | Freq: Three times a day (TID) | ORAL | Status: DC | PRN

## 2014-03-31 MED ORDER — PREDNISONE 20 MG PO TABS: ORAL_TABLET | ORAL | Status: DC

## 2014-03-31 NOTE — Progress Notes (Signed)
OFFICE NOTE  03/31/2014  CC:  Chief Complaint  Patient presents with  . Cough    x Friday  . Nasal Congestion    HPI: Patient is a 43 y.o. Caucasian female who is here for cough.  Onset 3-4 days ago, URI with some nasal drainage/PND, lots of dry cough.   Feels winded quickly but no SOB feeling at rest.  Minimal tightness in chest, no wheezing.  Tm yesterday morning was 100, none since then.  Nyquil helped her sleep last night.  No motrin or tylenol. No ST.  Mild + bilat maxillary face pain, mild + upper teeth pain, peri-orbital HA. No n/v/d or rash.  Pertinent PMH:  Past surgical, social, and family history reviewed and no changes noted since last office visit.  MEDS:  Outpatient Prescriptions Prior to Visit  Medication Sig Dispense Refill  . cetirizine (ZYRTEC) 10 MG tablet Take 1 tablet (10 mg total) by mouth daily as needed for allergies or rhinitis.  30 tablet  11  . dextromethorphan-guaiFENesin (MUCINEX DM) 30-600 MG per 12 hr tablet Take 1 tablet by mouth every 12 (twelve) hours.  30 tablet  0  . ibuprofen (ADVIL,MOTRIN) 800 MG tablet Take 1 tablet (800 mg total) by mouth 3 (three) times daily.  21 tablet  0  . mometasone (NASONEX) 50 MCG/ACT nasal spray Place 2 sprays into the nose daily.      . norethindrone (MICRONOR,CAMILA,ERRIN) 0.35 MG tablet Take 1 tablet (0.35 mg total) by mouth daily.  30 tablet  12  . HYDROcodone-acetaminophen (NORCO/VICODIN) 5-325 MG per tablet Take 2 tablets by mouth every 4 (four) hours as needed.  10 tablet  0  . methocarbamol (ROBAXIN) 500 MG tablet Take 1 tablet (500 mg total) by mouth 2 (two) times daily.  20 tablet  0   No facility-administered medications prior to visit.    PE: Blood pressure 131/86, pulse 97, temperature 98 F (36.7 C), temperature source Temporal, resp. rate 18, height 5\' 2"  (1.575 m), weight 209 lb (94.802 kg), SpO2 98.00%. VS: noted--normal. Gen: alert, NAD, NONTOXIC APPEARING. HEENT: eyes without injection,  drainage, or swelling.  Ears: EACs clear, TMs with normal light reflex and landmarks.  Nose: Clear rhinorrhea, with some dried, crusty exudate adherent to mildly injected mucosa.  No purulent d/c.  No paranasal sinus TTP.  No facial swelling.  Throat and mouth without focal lesion.  No pharyngial swelling, erythema, or exudate.   Neck: supple, no LAD.   LUNGS: CTA bilat but with forced exhalation she has trace wheeze that is immediately interrupted by excessive coughing, nonlabored resps.   CV: RRR, no m/r/g. EXT: no c/c/e SKIN: no rash  LAB: none  IMPRESSION AND PLAN:  Acute bronchitis, suspect RAD.  Viral etiology suspected. She does not tolerate bronchodilators (tachycardia). Plan: prednisone 40mg  qd x 5d, 20mg  qd x 5d. Tessalon perles 200mg  tid prn, #30, RF x 1. Hycodan susp, 1 tsp qhs prn, #135ml. Rest, fluids. Flu vaccine IM today.  An After Visit Summary was printed and given to the patient.  FOLLOW UP: prn

## 2014-03-31 NOTE — Progress Notes (Signed)
Pre visit review using our clinic review tool, if applicable. No additional management support is needed unless otherwise documented below in the visit note. 

## 2014-04-04 ENCOUNTER — Telehealth: Payer: Self-pay | Admitting: Family Medicine

## 2014-04-04 NOTE — Telephone Encounter (Addendum)
SHE WOULD LIKE A NEW RX. THE ONE SHE IS ON (Prednisone) IS NOT HELPING HER GET BETTER. IS IT POSSIBLE FOR YOU TO CALL IN SOMETHING NEW?./ DH

## 2014-04-04 NOTE — Telephone Encounter (Addendum)
Sorry, meant to send this to Dr. Anitra Lauth.

## 2014-04-04 NOTE — Telephone Encounter (Signed)
Please advise 

## 2014-04-06 MED ORDER — AZITHROMYCIN 250 MG PO TABS: ORAL_TABLET | ORAL | Status: DC

## 2014-04-06 NOTE — Telephone Encounter (Signed)
I sent in rx for azithromycin. I recommend she still finish all the other meds I rx'd as per the directions on the bottles. Pls remind her that this kind of illness takes some time to get over--longer than a "head cold". Encourage her to be patient.  -thx

## 2014-04-07 NOTE — Telephone Encounter (Signed)
LMOVM for pt to return call 

## 2014-04-11 NOTE — Telephone Encounter (Signed)
Left 2nd message for pt to return call. 

## 2014-04-14 ENCOUNTER — Encounter: Payer: Self-pay | Admitting: Family Medicine

## 2014-04-15 MED ORDER — HYDROCODONE-HOMATROPINE 5-1.5 MG/5ML PO SYRP: ORAL_SOLUTION | ORAL | Status: DC

## 2014-04-15 NOTE — Telephone Encounter (Signed)
Left detailed message on pt's cell.  Okay per DPR.  Rx is at front desk for pick up.

## 2014-04-15 NOTE — Telephone Encounter (Signed)
I'll RF this med x 1. If having Temp >100.5, wheezing, or shortness of breath then I recommend she come for recheck and we'll get a chest x-ray prior to her visit. If still just lingering cough then just take the cough med I'm refilling today as needed.  If not improved at the end of this rx then must be seen in office for recheck.--thx

## 2014-04-15 NOTE — Telephone Encounter (Signed)
Pt LMOM stating that she is still not feeling well.  She would like a refill of cough syrup if possible.  Please advise.

## 2014-06-26 ENCOUNTER — Encounter: Payer: BC Managed Care – PPO | Admitting: Family Medicine

## 2014-09-25 ENCOUNTER — Encounter: Payer: Self-pay | Admitting: Family Medicine

## 2014-09-25 ENCOUNTER — Ambulatory Visit (INDEPENDENT_AMBULATORY_CARE_PROVIDER_SITE_OTHER): Payer: BLUE CROSS/BLUE SHIELD | Admitting: Family Medicine

## 2014-09-25 VITALS — BP 110/70 | HR 84 | Temp 98.4°F | Ht 62.0 in | Wt 206.0 lb

## 2014-09-25 DIAGNOSIS — J208 Acute bronchitis due to other specified organisms: Secondary | ICD-10-CM

## 2014-09-25 DIAGNOSIS — J069 Acute upper respiratory infection, unspecified: Secondary | ICD-10-CM

## 2014-09-25 MED ORDER — MOMETASONE FUROATE 50 MCG/ACT NA SUSP: 2.0000 | Freq: Every day | NASAL | Status: DC

## 2014-09-25 MED ORDER — HYDROCODONE-HOMATROPINE 5-1.5 MG/5ML PO SYRP: ORAL_SOLUTION | ORAL | Status: DC

## 2014-09-25 MED ORDER — PREDNISONE 20 MG PO TABS: ORAL_TABLET | ORAL | Status: DC

## 2014-09-25 NOTE — Progress Notes (Signed)
Pre visit review using our clinic review tool, if applicable. No additional management support is needed unless otherwise documented below in the visit note. 

## 2014-09-25 NOTE — Progress Notes (Signed)
OFFICE NOTE  09/25/2014  CC:  Chief Complaint  Patient presents with  . Cough   HPI: Patient is a 44 y.o. Caucasian female who is here for cough. Onset 4-5 days ago, nasal congestion, PND and slight cough.  Tm 100 yesterday, says she felt febrile. Pain in right infra-orbital region lately, otherwise no face pain, no teeth pain.  HA/pressure in peri-orb and frontal regions with coughing.  No ST.  Cough is worse at night, some wheezing at night, localizes it to chest. Cough is nonproductive.  Trying mucinex DM and zyrtec are being used. She is not a smoker.  Pertinent PMH:  Past medical and surgical hx reviewed. I saw her for acute bronchitis illness 03/2014: she reports she does not tolerate bronchodilators.  MEDS: Not taking zithromax, tessalon, hycodan, nasonex, or prednisone listed below Outpatient Prescriptions Prior to Visit  Medication Sig Dispense Refill  . cetirizine (ZYRTEC) 10 MG tablet Take 1 tablet (10 mg total) by mouth daily as needed for allergies or rhinitis. 30 tablet 11  . ibuprofen (ADVIL,MOTRIN) 800 MG tablet Take 1 tablet (800 mg total) by mouth 3 (three) times daily. 21 tablet 0  . azithromycin (ZITHROMAX) 250 MG tablet 2 tabs po qd x 1d, then 1 tab po qd x 4d (Patient not taking: Reported on 09/25/2014) 6 tablet 0  . benzonatate (TESSALON) 200 MG capsule Take 1 capsule (200 mg total) by mouth 3 (three) times daily as needed for cough. (Patient not taking: Reported on 09/25/2014) 30 capsule 1  . dextromethorphan-guaiFENesin (MUCINEX DM) 30-600 MG per 12 hr tablet Take 1 tablet by mouth every 12 (twelve) hours. (Patient not taking: Reported on 09/25/2014) 30 tablet 0  . HYDROcodone-homatropine (HYCODAN) 5-1.5 MG/5ML syrup 1-2 tsp po qhs prn cough (Patient not taking: Reported on 09/25/2014) 120 mL 0  . mometasone (NASONEX) 50 MCG/ACT nasal spray Place 2 sprays into the nose daily.    . norethindrone (MICRONOR,CAMILA,ERRIN) 0.35 MG tablet Take 1 tablet (0.35 mg total) by  mouth daily. (Patient not taking: Reported on 09/25/2014) 30 tablet 12  . predniSONE (DELTASONE) 20 MG tablet 2 tabs po qd x 5d, then 1 tab po qd x 5d (Patient not taking: Reported on 09/25/2014) 15 tablet 0   No facility-administered medications prior to visit.    PE: Blood pressure 110/70, pulse 84, temperature 98.4 F (36.9 C), temperature source Oral, height 5\' 2"  (1.575 m), weight 206 lb (93.441 kg), SpO2 98 %. VS: noted--normal. Gen: alert, NAD, NONTOXIC APPEARING. HEENT: eyes without injection, drainage, or swelling.  Ears: EACs clear, TMs with normal light reflex and landmarks.  Nose: Clear rhinorrhea, with some dried, crusty exudate adherent to mildly injected mucosa.  No purulent d/c.  No paranasal sinus TTP.  No facial swelling.  Throat and mouth without focal lesion.  No pharyngial swelling, erythema, or exudate.   Neck: supple, no LAD.   LUNGS: CTA bilat, nonlabored resps.  She coughs frequently during exam, esp after forced exhalation maneuver.  Trace  CV: RRR, no m/r/g. EXT: no c/c/e SKIN: no rash    IMPRESSION AND PLAN:  Viral URI with acute bronchitis, some mild RAD with this. She says she does not tolerate bronchodilators (tachycardia). Rx'd prednisone 40mg  qd x 5d, then 20mg  qd x 5d. Hycodan syrup, 1-2 tsp qhs prn cough, but continue mucinex DM in daytime. Continue zyrtec and she asked for RF of nasonex that she has been on in the remote past. Also recommended saline nasal spray for irrigation of nostrils  multiple times a day. Signs/symptoms to call or return for were reviewed and pt expressed understanding.  An After Visit Summary was printed and given to the patient.  FOLLOW UP: prn

## 2014-11-25 ENCOUNTER — Other Ambulatory Visit: Payer: Self-pay

## 2014-11-25 DIAGNOSIS — Z1231 Encounter for screening mammogram for malignant neoplasm of breast: Secondary | ICD-10-CM

## 2014-12-19 ENCOUNTER — Inpatient Hospital Stay: Admission: RE | Admit: 2014-12-19 | Payer: BLUE CROSS/BLUE SHIELD | Source: Ambulatory Visit

## 2015-01-06 ENCOUNTER — Telehealth: Payer: Self-pay | Admitting: Medical

## 2015-01-06 ENCOUNTER — Encounter: Payer: Self-pay | Admitting: Medical

## 2015-01-06 ENCOUNTER — Ambulatory Visit (INDEPENDENT_AMBULATORY_CARE_PROVIDER_SITE_OTHER): Payer: BLUE CROSS/BLUE SHIELD | Admitting: Medical

## 2015-01-06 ENCOUNTER — Emergency Department (HOSPITAL_BASED_OUTPATIENT_CLINIC_OR_DEPARTMENT_OTHER): Payer: BLUE CROSS/BLUE SHIELD

## 2015-01-06 ENCOUNTER — Emergency Department (HOSPITAL_BASED_OUTPATIENT_CLINIC_OR_DEPARTMENT_OTHER)
Admission: EM | Admit: 2015-01-06 | Discharge: 2015-01-06 | Disposition: A | Discharge status: Home / Self Care | Payer: BLUE CROSS/BLUE SHIELD | Attending: Emergency Medicine | Admitting: Emergency Medicine

## 2015-01-06 ENCOUNTER — Other Ambulatory Visit: Payer: Self-pay | Admitting: Family Medicine

## 2015-01-06 ENCOUNTER — Encounter (HOSPITAL_BASED_OUTPATIENT_CLINIC_OR_DEPARTMENT_OTHER): Payer: Self-pay

## 2015-01-06 VITALS — BP 152/78 | HR 65 | Temp 99.0°F | Ht 62.0 in | Wt 208.0 lb

## 2015-01-06 DIAGNOSIS — Z79899 Other long term (current) drug therapy: Secondary | ICD-10-CM | POA: Diagnosis not present

## 2015-01-06 DIAGNOSIS — Z86711 Personal history of pulmonary embolism: Secondary | ICD-10-CM | POA: Diagnosis not present

## 2015-01-06 DIAGNOSIS — Z8739 Personal history of other diseases of the musculoskeletal system and connective tissue: Secondary | ICD-10-CM | POA: Insufficient documentation

## 2015-01-06 DIAGNOSIS — Z7951 Long term (current) use of inhaled steroids: Secondary | ICD-10-CM | POA: Insufficient documentation

## 2015-01-06 DIAGNOSIS — R0789 Other chest pain: Secondary | ICD-10-CM

## 2015-01-06 DIAGNOSIS — Z2252 Carrier of viral hepatitis C: Secondary | ICD-10-CM | POA: Insufficient documentation

## 2015-01-06 DIAGNOSIS — R079 Chest pain, unspecified: Secondary | ICD-10-CM | POA: Diagnosis present

## 2015-01-06 DIAGNOSIS — I10 Essential (primary) hypertension: Secondary | ICD-10-CM | POA: Insufficient documentation

## 2015-01-06 DIAGNOSIS — Z87440 Personal history of urinary (tract) infections: Secondary | ICD-10-CM | POA: Diagnosis not present

## 2015-01-06 DIAGNOSIS — B029 Zoster without complications: Secondary | ICD-10-CM | POA: Diagnosis not present

## 2015-01-06 DIAGNOSIS — Z8742 Personal history of other diseases of the female genital tract: Secondary | ICD-10-CM | POA: Insufficient documentation

## 2015-01-06 DIAGNOSIS — Z87828 Personal history of other (healed) physical injury and trauma: Secondary | ICD-10-CM | POA: Insufficient documentation

## 2015-01-06 DIAGNOSIS — B0229 Other postherpetic nervous system involvement: Secondary | ICD-10-CM

## 2015-01-06 DIAGNOSIS — Z8709 Personal history of other diseases of the respiratory system: Secondary | ICD-10-CM | POA: Insufficient documentation

## 2015-01-06 DIAGNOSIS — Z8619 Personal history of other infectious and parasitic diseases: Secondary | ICD-10-CM | POA: Diagnosis not present

## 2015-01-06 DIAGNOSIS — R072 Precordial pain: Secondary | ICD-10-CM | POA: Diagnosis not present

## 2015-01-06 DIAGNOSIS — E669 Obesity, unspecified: Secondary | ICD-10-CM | POA: Diagnosis not present

## 2015-01-06 HISTORY — DX: Zoster without complications: B02.9

## 2015-01-06 LAB — TROPONIN I

## 2015-01-06 MED ORDER — GI COCKTAIL ~~LOC~~
30.0000 mL | Freq: Once | ORAL | Status: AC
  Administered 2015-01-06: 30 mL via ORAL
  Filled 2015-01-06: qty 30

## 2015-01-06 NOTE — ED Notes (Signed)
CP since Sunday-sent from PCP office-was there for f/u for shingles to right posterior shoulder

## 2015-01-06 NOTE — ED Provider Notes (Signed)
CSN: [id number]     Arrival date & time 01/06/15  1243 History   First MD Initiated Contact with Patient 01/06/15 1257     Chief Complaint  Patient presents with  . Chest Pain     (Consider location/radiation/quality/duration/timing/severity/associated sxs/prior Treatment) HPI Comments: Pt. Is a 44 y/o F with hx of HTN not on medication, and recent Shingles episode over the past 3 weeks of her right shoulder for which she has been on valtrex. She says that she also had pain radiating from her back around under her arm and under her right breast due to the shingles. She says that she came in to her PCP's office today for follow up , and mentioned to them that she has had a substernal / lower chest pain for the past few days. She denies pain with exertion or shortness of breath, diaphoresis, nausea, vomiting. She has shoulder pain on the right due to the shingles. She says she has not had any vision changes, or headaches. She has not family history of early cardiac death or risk factors.   The history is provided by the patient.    Past Medical History  Diagnosis Date  . Hypertension   . Chicken pox as a child  . Hepatitis C carrier 02/07/2011  . Hx: UTI (urinary tract infection) 02/07/2011  . Abnormal cervical cytology 02/07/2011  . Obese 03/04/2011  . Tinea pedis of right foot 03/07/2011  . Psoriasis 09/02/2011  . Left lateral epicondylitis 09/02/2011  . Right shoulder strain 03/29/2012  . Hypokalemia 05/25/2012  . Bronchitis 05/25/2012  . Palpitations 07/08/2012  . Menorrhagia 11/24/2012  . Pulmonary embolism   . Shingles    Past Surgical History  Procedure Laterality Date  . Dilation and curettage of uterus  2003    SAB   Family History  Problem Relation Age of Onset  . Diabetes Mother     type 2  . Heart attack Father   . Hypertension Father   . Cancer Paternal Grandmother     ?   History  Substance Use Topics  . Smoking status: Never Smoker   . Smokeless tobacco: Never  Used  . Alcohol Use: No   OB History    Gravida Para Term Preterm AB TAB SAB Ectopic Multiple Living   4 2   2  2   2      Review of Systems  Constitutional: Negative for fever, chills, diaphoresis, appetite change and fatigue.  HENT: Negative for congestion, facial swelling, rhinorrhea, sinus pressure, sore throat, tinnitus and trouble swallowing.   Eyes: Negative.  Negative for photophobia and pain.  Respiratory: Negative.  Negative for cough, choking, chest tightness, shortness of breath, wheezing and stridor.   Cardiovascular: Positive for chest pain. Negative for palpitations and leg swelling.  Gastrointestinal: Negative.  Negative for nausea, vomiting, abdominal pain, diarrhea and constipation.  Endocrine: Negative.  Negative for cold intolerance and polyuria.  Genitourinary: Negative.  Negative for urgency and frequency.  Musculoskeletal: Negative.  Negative for myalgias, back pain, arthralgias, gait problem, neck pain and neck stiffness.  Skin: Negative.  Negative for pallor and rash.  Allergic/Immunologic: Negative.   Neurological: Negative.  Negative for dizziness, seizures, syncope, facial asymmetry, light-headedness, numbness and headaches.  Hematological: Negative.  Negative for adenopathy.  Psychiatric/Behavioral: Negative.       Allergies  Review of patient's allergies indicates no known allergies.  Home Medications   Prior to Admission medications   Medication Sig Start Date End Date Taking? Authorizing  Provider  cetirizine (ZYRTEC) 10 MG tablet Take 1 tablet (10 mg total) by mouth daily as needed for allergies or rhinitis. 05/25/12   Mosie Lukes, MD  gabapentin (NEURONTIN) 100 MG capsule Take 100 mg by mouth every 8 (eight) hours.    Historical Provider, MD  HYDROcodone-homatropine [facility]) 5-1.5 MG/5ML syrup 1-2 tsp po qhs prn cough 09/25/14   Tammi Sou, MD  ibuprofen (ADVIL,MOTRIN) 800 MG tablet Take 1 tablet (800 mg total) by mouth 3 (three) times  daily. 09/01/13   Fransico Meadow, PA-C  mometasone (NASONEX) 50 MCG/ACT nasal spray Place 2 sprays into the nose daily. 09/25/14   Tammi Sou, MD  predniSONE (DELTASONE) 20 MG tablet 2 tabs po qd x 5d, then 1 tab po qd x 5d 09/25/14   Tammi Sou, MD  valACYclovir (VALTREX) 1000 MG tablet Take 1 g by mouth 3 (three) times daily.    Historical Provider, MD   BP 143/92 mmHg  Pulse 107  Temp(Src) 97.7 F (36.5 C) (Oral)  Resp 16  Ht 5\' 2"  (1.575 m)  Wt 207 lb (93.895 kg)  BMI 37.85 kg/m2  SpO2 99%  LMP 12/25/2014 Physical Exam  Constitutional: She is oriented to person, place, and time. She appears well-developed and well-nourished. No distress.  HENT:  Head: Normocephalic and atraumatic.  Right Ear: External ear normal.  Mouth/Throat: Oropharynx is clear and moist. No oropharyngeal exudate.  Eyes: Conjunctivae and EOM are normal. Pupils are equal, round, and reactive to light.  Neck: Normal range of motion. Neck supple. No JVD present. No thyromegaly present.  Cardiovascular: Normal rate, regular rhythm, S1 normal, S2 normal, normal heart sounds and intact distal pulses.  Exam reveals no gallop and no friction rub.   No murmur heard. Pulmonary/Chest: Effort normal and breath sounds normal. No respiratory distress. She has no wheezes. She has no rales. She exhibits no tenderness.  Abdominal: Soft. Bowel sounds are normal. She exhibits no distension and no mass. There is no tenderness. There is no rebound and no guarding.  Musculoskeletal: Normal range of motion. She exhibits no edema or tenderness.  Lymphadenopathy:    She has no cervical adenopathy.  Neurological: She is alert and oriented to person, place, and time.  Skin: Skin is warm and dry. No rash noted. She is not diaphoretic. No erythema.  Psychiatric: She has a normal mood and affect. Her behavior is normal.    ED Course  Procedures (including critical care time) Labs Review Labs Reviewed  TROPONIN I     Imaging Review Dg Chest 2 View  01/06/2015   CLINICAL DATA:  Two day history of chest pain  EXAM: CHEST  2 VIEW  COMPARISON:  September 09, 2013  FINDINGS: Lungs are clear. Heart size and pulmonary vascularity are normal. No adenopathy. No pneumothorax. No bone lesions.  IMPRESSION: No edema or consolidation.   Electronically Signed   By: Lowella Grip III M.D.   On: 01/06/2015 13:52     EKG Interpretation   Date/Time:  Tuesday January 06 2015 12:51:52 EDT Ventricular Rate:  70 PR Interval:  152 QRS Duration: 76 QT Interval:  408 QTC Calculation: 440 R Axis:   16 Text Interpretation:  Normal sinus rhythm Low voltage QRS No significant  change since last tracing Confirmed by Maryan Rued  MD, Loree Fee ([id number]) on  01/06/2015 12:55:16 PM      MDM   Final diagnoses:  Precordial pain    Pt. Is a 44 y/o F here  with mild dull central chest pain. Her EKG, CXR, and Troponin are all WNL. She received GI cocktail with little improvement. HEART Score - 2. Cardiac vs. GI vs. Costochondral vs. Neuropathic etiology (due to zoster infection in this dermatome). Pt. To start gabapentin for neuropathic pain per PCP. Recommend PPI for acid blockade, and ibuprofen as needed. Should follow up with PCP and receive cardiac stress testing as indicated. Return precautions reviewed. Safe for discharge to home with close follow up with PCP / Cardiology.     Aquilla Hacker, MD 01/06/15 1446  Blanchie Dessert, MD 01/06/15 1539

## 2015-01-06 NOTE — Progress Notes (Signed)
Pre visit review using our clinic review tool, if applicable. No additional management support is needed unless otherwise documented below in the visit note. 

## 2015-01-06 NOTE — Telephone Encounter (Signed)
Caller name: Aquilla Hacker, MD from [place] ED  Reason for call: Dr. Minda Ditto said that he is recommending outpt cardiology and a stress test for Brittany Johnson. Please get those ordered. Also scheduled ED follow up appt for 01/09/15 11:00am per his request.

## 2015-01-06 NOTE — Patient Instructions (Addendum)
For your post herpetic nerve pain continue gabapentin. If you don't want to take hydrocodone then keep in mind tramadol is other option.  For your dull mid chest pain I want you to be evaluated in ED. This is based on location, present pain. Family history and your hx of htn.  Follow up with [place] post ED evaluation(date they determine) Contacted charge nurse and notified sending pt down.

## 2015-01-06 NOTE — Discharge Instructions (Signed)

## 2015-01-06 NOTE — Progress Notes (Signed)
Subjective:    Patient ID: Brittany Johnson, female    DOB: Jun 07, 1971, 44 y.o.   MRN: [id number]  HPI  Pt went to [facility] physician urgent care. Pt had patch on her shoulder. Pt was given valacyclovir. She was given 7 days of med. Pt dx with shingles about 10 days ago. Pt area of rash was on her rt trapezius area. Rash area cleared up but now some rt side shooting pain from rash area and runs towards her rt breast and ends mid chest. The pain that is sharp and radiating toward front is transient. Also with this some mid chest dull sensation. This dull sensation is present for 2 days. Not increasing in intensity. No sob. History of htn. Not on med know. No hx of elevated cholesterol. Pt does not smoke. Dad- heart problems. Possible problems in his 40's(onld note from cardiologist stated MI for dad in his 52's). Mom- no history of heart disease.    Review of Systems  Constitutional: Negative for fever, chills and diaphoresis.  Respiratory: Negative for chest tightness, shortness of breath and wheezing.   Cardiovascular: Positive for chest pain. Negative for palpitations.  Gastrointestinal: Negative for abdominal pain.  Musculoskeletal: Negative for back pain.       No leg pain.  Skin:       Post herpetic neuralgia.  Neurological: Negative for seizures, weakness, numbness and headaches.  Hematological: Negative for adenopathy. Does not bruise/bleed easily.  Psychiatric/Behavioral: Negative for behavioral problems and confusion.   Past Medical History  Diagnosis Date  . Hypertension   . Chicken pox as a child  . Hepatitis C carrier 02/07/2011  . Hx: UTI (urinary tract infection) 02/07/2011  . Abnormal cervical cytology 02/07/2011  . Obese 03/04/2011  . Tinea pedis of right foot 03/07/2011  . Psoriasis 09/02/2011  . Left lateral epicondylitis 09/02/2011  . Right shoulder strain 03/29/2012  . Hypokalemia 05/25/2012  . Bronchitis 05/25/2012  . Palpitations 07/08/2012  . Menorrhagia 11/24/2012   . Pulmonary embolism   . Shingles     History   Social History  . Marital Status: Married    Spouse Name: N/A  . Number of Children: N/A  . Years of Education: N/A   Occupational History  . Not on file.   Social History Main Topics  . Smoking status: Never Smoker   . Smokeless tobacco: Never Used  . Alcohol Use: No  . Drug Use: No  . Sexual Activity:    Partners: Male    Birth Control/ Protection: None   Other Topics Concern  . Not on file   Social History Narrative    Past Surgical History  Procedure Laterality Date  . Dilation and curettage of uterus  2003    SAB    Family History  Problem Relation Age of Onset  . Diabetes Mother     type 2  . Heart attack Father   . Hypertension Father   . Cancer Paternal Grandmother     ?    No Known Allergies  Current Outpatient Prescriptions on File Prior to Visit  Medication Sig Dispense Refill  . cetirizine (ZYRTEC) 10 MG tablet Take 1 tablet (10 mg total) by mouth daily as needed for allergies or rhinitis. 30 tablet 11  . HYDROcodone-homatropine (HYCODAN) 5-1.5 MG/5ML syrup 1-2 tsp po qhs prn cough 120 mL 0  . ibuprofen (ADVIL,MOTRIN) 800 MG tablet Take 1 tablet (800 mg total) by mouth 3 (three) times daily. 21 tablet 0  . mometasone (  NASONEX) 50 MCG/ACT nasal spray Place 2 sprays into the nose daily. 17 g 3  . predniSONE (DELTASONE) 20 MG tablet 2 tabs po qd x 5d, then 1 tab po qd x 5d 15 tablet 0   No current facility-administered medications on file prior to visit.    BP 152/78 mmHg  Pulse 65  Temp(Src) 99 F (37.2 C) (Oral)  Ht 5\' 2"  (1.575 m)  Wt 208 lb (94.348 kg)  BMI 38.03 kg/m2  SpO2 99%  LMP 12/30/2014       Objective:   Physical Exam  General- No acute distress. Pleasant patient. Neck- Full range of motion, no jvd Lungs- Clear, even and unlabored. Heart- regular rate and rhythm. Neurologic- CNII- XII grossly intact. Anterior chest wall- no reproducible type pain on  palpation.  Lower ext- negtive homans sign. No edema.   Skin- rt posterior shoulder. 5 healing lesions appears prior vesicles that are healing.       Assessment & Plan:  For your post herpetic nerve pain continue gabapentin. If you don't want to take hydrocodone then keep in mind tramadol is other option.  For your dull mid chest pain I want you to be evaluated in ED. This is based on location, present pain. Family history and your hx of htn.  Follow up with [place] post ED evaluation(date they determine)

## 2015-01-06 NOTE — Telephone Encounter (Signed)
Please see below.  Ok to place referral?

## 2015-01-07 NOTE — Telephone Encounter (Signed)
Refer to cardilogist for stress test.

## 2015-01-07 NOTE — Telephone Encounter (Signed)
Referral already placed, patient notified.

## 2015-01-09 ENCOUNTER — Encounter: Payer: Self-pay | Admitting: Medical

## 2015-01-09 ENCOUNTER — Ambulatory Visit (INDEPENDENT_AMBULATORY_CARE_PROVIDER_SITE_OTHER): Payer: BLUE CROSS/BLUE SHIELD | Admitting: Medical

## 2015-01-09 VITALS — BP 135/80 | HR 79 | Temp 98.8°F | Ht 62.0 in | Wt 208.0 lb

## 2015-01-09 DIAGNOSIS — R0789 Other chest pain: Secondary | ICD-10-CM

## 2015-01-09 DIAGNOSIS — M546 Pain in thoracic spine: Secondary | ICD-10-CM

## 2015-01-09 NOTE — Patient Instructions (Signed)
Both area improved. No present chest pain on exam/intermiew. Work up ED negative. If pain in chest  Occurs again  and significant then ED evaluation over weekend.   See cardiologist on Monday.  For the cough seems pnd related after stopping zyrtec. Advise restart.  Your bp is not elevated today. Cardiologist will recheck.  Follow up with pcp as regularly scheduled after cardiologist or as needed with myself.

## 2015-01-09 NOTE — Progress Notes (Signed)
Pre visit review using our clinic review tool, if applicable. No additional management support is needed unless otherwise documented below in the visit note. 

## 2015-01-09 NOTE — Progress Notes (Signed)
Subjective:    Patient ID: Brittany Johnson, female    DOB: November 17, 1970, 44 y.o.   MRN: [id number]  HPI  Pt in for follow up. She is going to cardiologist on Monday morning for evaluation.  Pt still has same mild occasional pain mid chest and upper back. Back pain little more consistent but present during exam. Pain in mid chest more transient. Her symptoms are less pronounced since left ED When she has chest discomfort or back pain she has no jaw pain, no arm pain, no nausea, no vomiting, and no sob. No diaphoresis.  Pt cxr,  troponin and d-dimer were negative in ED. Pt here today as follow up from ED.  Last time chest discomfort about 4 hours ago. Mild transient. None now/since.  Review of Systems  Constitutional: Negative for fever, chills and fatigue.  Respiratory: Positive for cough. Negative for chest tightness, shortness of breath and wheezing.        Cough just started when she stopped her zyrtec. She will restart.  Transient very mild faint chest dicomfort. But not presently.  Cardiovascular: Negative for chest pain and palpitations.  Musculoskeletal: Positive for back pain.       Faint mid tspine.  Hematological: Negative for adenopathy. Does not bruise/bleed easily.  Psychiatric/Behavioral: Negative for behavioral problems and confusion.   Past Medical History  Diagnosis Date  . Hypertension   . Chicken pox as a child  . Hepatitis C carrier 02/07/2011  . Hx: UTI (urinary tract infection) 02/07/2011  . Abnormal cervical cytology 02/07/2011  . Obese 03/04/2011  . Tinea pedis of right foot 03/07/2011  . Psoriasis 09/02/2011  . Left lateral epicondylitis 09/02/2011  . Right shoulder strain 03/29/2012  . Hypokalemia 05/25/2012  . Bronchitis 05/25/2012  . Palpitations 07/08/2012  . Menorrhagia 11/24/2012  . Pulmonary embolism   . Shingles     History   Social History  . Marital Status: Married    Spouse Name: N/A  . Number of Children: N/A  . Years of Education: N/A     Occupational History  . Not on file.   Social History Main Topics  . Smoking status: Never Smoker   . Smokeless tobacco: Never Used  . Alcohol Use: No  . Drug Use: No  . Sexual Activity:    Partners: Male    Birth Control/ Protection: None   Other Topics Concern  . Not on file   Social History Narrative    Past Surgical History  Procedure Laterality Date  . Dilation and curettage of uterus  2003    SAB    Family History  Problem Relation Age of Onset  . Diabetes Mother     type 2  . Heart attack Father   . Hypertension Father   . Cancer Paternal Grandmother     ?    No Known Allergies  Current Outpatient Prescriptions on File Prior to Visit  Medication Sig Dispense Refill  . cetirizine (ZYRTEC) 10 MG tablet Take 1 tablet (10 mg total) by mouth daily as needed for allergies or rhinitis. 30 tablet 11  . gabapentin (NEURONTIN) 100 MG capsule Take 100 mg by mouth every 8 (eight) hours.    Marland Kitchen HYDROcodone-homatropine (HYCODAN) 5-1.5 MG/5ML syrup 1-2 tsp po qhs prn cough 120 mL 0  . ibuprofen (ADVIL,MOTRIN) 800 MG tablet Take 1 tablet (800 mg total) by mouth 3 (three) times daily. 21 tablet 0  . mometasone (NASONEX) 50 MCG/ACT nasal spray Place 2 sprays into the nose  daily. 17 g 3  . predniSONE (DELTASONE) 20 MG tablet 2 tabs po qd x 5d, then 1 tab po qd x 5d 15 tablet 0  . valACYclovir (VALTREX) 1000 MG tablet Take 1 g by mouth 3 (three) times daily.     No current facility-administered medications on file prior to visit.    BP 135/80 mmHg  Pulse 79  Temp(Src) 98.8 F (37.1 C) (Oral)  Ht 5\' 2"  (1.575 m)  Wt 208 lb (94.348 kg)  BMI 38.03 kg/m2  SpO2 99%  LMP 12/25/2014      Objective:   Physical Exam  General Mental Status- Alert. General Appearance- Not in acute distress.   Skin General: Color- Normal Color. Moisture- Normal Moisture.  Neck Carotid Arteries- Normal color. Moisture- Normal Moisture. No carotid bruits. No JVD.  Chest and Lung  Exam Auscultation: Breath Sounds:-Normal.  Cardiovascular Auscultation:Rythm- Regular. Murmurs & Other Heart Sounds:Auscultation of the heart reveals- No Murmurs.  Abdomen Inspection:-Inspeection Normal. Palpation/Percussion:Note:No mass. Palpation and Percussion of the abdomen reveal- Non Tender, Non Distended + BS, no rebound or guarding.   Neurologic Cranial Nerve exam:- CN III-XII intact(No nystagmus), symmetric smile. Strength:- 5/5 equal and symmetric strength both upper and lower extremities.      Assessment & Plan:  Based on neg ED eval. No chest symptoms now. Decided no ekg indicated.  Both area improved. No present chest pain on exam/intermiew. Work up ED negative. If pain in chest  Occurs again  and significant then ED evaluation over weekend.   See cardiologist on Monday.  For the cough seems pnd related after stopping zyrtec. Advise restart.  Your bp is not elevated today. Cardiologist will recheck.  Follow up with pcp as regularly scheduled after cardiologist or as needed with myself.

## 2015-01-11 NOTE — Progress Notes (Signed)
Cardiology Office Note   Date:  01/12/2015   ID:  Brittany Johnson, DOB Jul 14, 1970, MRN [id number]   PCP:  Penni Homans, MD  Cardiologist:  Lilli Light Oval Linsey, MD  Chief Complaint  Patient presents with  . Establish Care    still having some chest discomfort but it is decreasing,no swelling or pain of legs,no other concerns  . Chest Pain      History of Present Illness: Brittany Johnson is a 44 y.o. female  With HTN, Hep C, palpitations and prior PE who presents for evaluation of chest and back pain.    Brittany Johnson had shingles on her R shoulder at the end of June.  Approxiately 10 days ago she noted dull CP in the center of her R shoulder that moved to chest that radiated to her back.  It was 4/10 constant pain.  It was non-exertional was not associated with SOB, N/VShe mentioned this to her PCP who referred her to the ED.  She was evaluated in the ED on 7/26, at which time D-dimer and one Troponin I were wnl.   Brittany Johnson was seen by Dr. Aundra Dubin 09/2012 with palpitations that typically occurred in the evenings.  They lasted for 10-15 minutes and occurred almost daily.  Her potassium was low at that time, thought to be 2/2 HCTZ, and the palpitations resolved with supplementation. ECG was unremarkable and there were no other clear precipitants.  24h holter did not reveal any abnormalities.  She denies SOB, DOE, orthopnea, PND or LE edema.  She occasionally notices lightheadedness and palpitations.  She palpitations last less than 1 minute and are not associated with SOB, lightheadedness or dizziness.  She also notices occasional pain in her L arm.  It is not associated with CP and is non-exertional.  She plays golf regularly.  She walks for exercise twice a week.  She wishes she could walk more, but is busy with children and work.  Past Medical History  Diagnosis Date  . Hypertension   . Chicken pox as a child  . Hepatitis C carrier 02/07/2011  . Hx: UTI (urinary tract  infection) 02/07/2011  . Abnormal cervical cytology 02/07/2011  . Obese 03/04/2011  . Tinea pedis of right foot 03/07/2011  . Psoriasis 09/02/2011  . Left lateral epicondylitis 09/02/2011  . Right shoulder strain 03/29/2012  . Hypokalemia 05/25/2012  . Bronchitis 05/25/2012  . Palpitations 07/08/2012  . Menorrhagia 11/24/2012  . Pulmonary embolism   . Shingles     Past Surgical History  Procedure Laterality Date  . Dilation and curettage of uterus  2003    SAB     Current Outpatient Prescriptions  Medication Sig Dispense Refill  . cetirizine (ZYRTEC) 10 MG tablet Take 1 tablet (10 mg total) by mouth daily as needed for allergies or rhinitis. 30 tablet 11  . ibuprofen (ADVIL,MOTRIN) 800 MG tablet Take 1 tablet (800 mg total) by mouth 3 (three) times daily. 21 tablet 0  . mometasone (NASONEX) 50 MCG/ACT nasal spray Place 2 sprays into the nose daily. 17 g 3   No current facility-administered medications for this visit.    Allergies:   Review of patient's allergies indicates no known allergies.    Social History:  The patient  reports that she has never smoked. She has never used smokeless tobacco. She reports that she does not drink alcohol or use illicit drugs.   Family History:  The patient's family history includes Cancer in her paternal grandmother; Diabetes in  her mother; Heart attack in her father; Hypertension in her father and mother.   Father had MI in mid 67s.   ROS:  Please see the history of present illness.  Otherwise, review of systems are positive for none.   All other systems are reviewed and negative.    PHYSICAL EXAM: VS:  BP 130/84 mmHg  Pulse 78  Ht 5\' 2"  (1.575 m)  Wt 93.804 kg (206 lb 12.8 oz)  BMI 37.81 kg/m2  LMP 12/25/2014 , BMI Body mass index is 37.81 kg/(m^2). GENERAL:  Well appearing HEENT:  Pupils equal round and reactive, fundi not visualized, oral mucosa unremarkable NECK:  No jugular venous distention, waveform within normal limits, carotid  upstroke brisk and symmetric, no bruits, no thyromegaly LYMPHATICS:  No cervical, inguinal adenopathy LUNGS:  Clear to auscultation bilaterally CHEST:  Unremarkable HEART:  PMI not displaced or sustained,S1 and S2 within normal limits, no S3, no S4, no clicks, no rubs, no murmurs ABD:  Flat, positive bowel sounds normal in frequency in pitch, no bruits, no rebound, no guarding, no midline pulsatile mass, no hepatomegaly, no splenomegaly EXT:  2 plus pulses throughout, no edema, no cyanosis no clubbing.  5/5 bilateral UE strength proximally and distally.  Sensation in tact to LT. SKIN:  No rashes no nodules NEURO:  Cranial nerves II through XII grossly intact, motor grossly intact throughout PSYCH:  Cognitively intact, oriented to person place and time    EKG:  EKG is ordered today. The ekg ordered today demonstrates Sinus rhythm at 78bpm.    TTE 1/14: EF 65%, very mild focal basal septal hypertrophy, grade I diastolic dysfunction, mild LAE, no SAM or LVOT gradient.   Recent Labs: No results found for requested labs within last 365 days.    Lipid Panel    Component Value Date/Time   CHOL 143 09/02/2011 0900   TRIG 72.0 09/02/2011 0900   HDL 49.80 09/02/2011 0900   CHOLHDL 3 09/02/2011 0900   VLDL 14.4 09/02/2011 0900   LDLCALC 79 09/02/2011 0900      Wt Readings from Last 3 Encounters:  01/12/15 93.804 kg (206 lb 12.8 oz)  01/09/15 94.348 kg (208 lb)  01/06/15 93.895 kg (207 lb)      Other studies Reviewed: Additional studies/ records that were reviewed today include: n/a.   ASSESSMENT AND PLAN:  # CP: Symptoms were likely related to shingles as the pain was constant and occurred in the setting of a shingles outbreak.  Other than her family history, she is at low risk.  Patient was counseled to increase her exercise to 3 times per week of vigorous activity.  Similarly, her L arm pain is non-exertional and likely 2/2 musculoskeletal pain from golfing.  She will take  ibuprofen tid x3 days and alternate ice/heat.  Pt will f/u with PCP if this recurs.   Current medicines are reviewed at length with the patient today.  The patient does not have concerns regarding medicines.  The following changes have been made:  no change  Labs/ tests ordered today include: none  No orders of the defined types were placed in this encounter.     Disposition:   FU with Dr. Oval Linsey prn    Signed, Sharol Harness, MD  01/12/2015 9:03 AM    [place]

## 2015-01-12 ENCOUNTER — Encounter: Payer: Self-pay | Admitting: Cardiovascular Disease

## 2015-01-12 ENCOUNTER — Ambulatory Visit (INDEPENDENT_AMBULATORY_CARE_PROVIDER_SITE_OTHER): Payer: BLUE CROSS/BLUE SHIELD | Admitting: Cardiovascular Disease

## 2015-01-12 VITALS — BP 130/84 | HR 78 | Ht 62.0 in | Wt 206.8 lb

## 2015-01-12 DIAGNOSIS — R079 Chest pain, unspecified: Secondary | ICD-10-CM

## 2015-01-12 NOTE — Patient Instructions (Signed)
No changes with current medications  Your physician recommends that you schedule a follow-up appointment on as needed  Basis.

## 2015-05-13 ENCOUNTER — Telehealth: Payer: Self-pay | Admitting: Family Medicine

## 2015-05-13 NOTE — Telephone Encounter (Signed)
Called pt to schedule flu vac. lvm for pt to call the office or schedule via My Chart.

## 2015-05-14 ENCOUNTER — Telehealth: Payer: Self-pay | Admitting: Family Medicine

## 2015-05-14 NOTE — Telephone Encounter (Signed)
Patient would like to transfer to [place] from Dr. Charlett Blake

## 2015-05-14 NOTE — Telephone Encounter (Signed)
Ok with me 

## 2015-05-14 NOTE — Telephone Encounter (Signed)
OK with metiff

## 2015-05-15 NOTE — Telephone Encounter (Signed)
Physical scheduled for 06/26/2015 with NP

## 2015-05-29 ENCOUNTER — Encounter: Payer: BLUE CROSS/BLUE SHIELD | Admitting: Family Medicine

## 2015-06-26 ENCOUNTER — Ambulatory Visit (INDEPENDENT_AMBULATORY_CARE_PROVIDER_SITE_OTHER): Payer: BLUE CROSS/BLUE SHIELD | Admitting: Family

## 2015-06-26 ENCOUNTER — Encounter: Payer: Self-pay | Admitting: Family

## 2015-06-26 VITALS — BP 140/83 | HR 79 | Temp 98.4°F | Resp 16 | Ht 66.0 in | Wt 203.6 lb

## 2015-06-26 DIAGNOSIS — Z0000 Encounter for general adult medical examination without abnormal findings: Secondary | ICD-10-CM

## 2015-06-26 DIAGNOSIS — Z0001 Encounter for general adult medical examination with abnormal findings: Secondary | ICD-10-CM

## 2015-06-26 DIAGNOSIS — B189 Chronic viral hepatitis, unspecified: Secondary | ICD-10-CM

## 2015-06-26 DIAGNOSIS — Z23 Encounter for immunization: Secondary | ICD-10-CM

## 2015-06-26 DIAGNOSIS — L309 Dermatitis, unspecified: Secondary | ICD-10-CM

## 2015-06-26 LAB — HEPATIC FUNCTION PANEL
ALT: 19 U/L (ref 0–35)
AST: 21 U/L (ref 0–37)
Albumin: 4.1 g/dL (ref 3.5–5.2)
Alkaline Phosphatase: 43 U/L (ref 39–117)
Bilirubin, Direct: 0.1 mg/dL (ref 0.0–0.3)
Total Bilirubin: 0.4 mg/dL (ref 0.2–1.2)
Total Protein: 7.4 g/dL (ref 6.0–8.3)

## 2015-06-26 LAB — BASIC METABOLIC PANEL
BUN: 11 mg/dL (ref 6–23)
CO2: 25 meq/L (ref 19–32)
Calcium: 9.2 mg/dL (ref 8.4–10.5)
Chloride: 107 mEq/L (ref 96–112)
Creatinine, Ser: 1.01 mg/dL (ref 0.40–1.20)
GFR: 62.77 mL/min (ref >60.00)
GLUCOSE: 77 mg/dL (ref 70–99)
POTASSIUM: 3.4 meq/L — AB (ref 3.5–5.1)
Sodium: 140 mEq/L (ref 135–145)

## 2015-06-26 LAB — CBC WITH DIFFERENTIAL/PLATELET
Basophils Absolute: 0 10*3/uL (ref 0.0–0.1)
Basophils Relative: 0.6 % (ref 0.0–3.0)
EOS PCT: 1 % (ref 0.0–5.0)
Eosinophils Absolute: 0.1 10*3/uL (ref 0.0–0.7)
HCT: 34.5 % — ABNORMAL LOW (ref 36.0–46.0)
Hemoglobin: 10.6 g/dL — ABNORMAL LOW (ref 12.0–15.0)
LYMPHS ABS: 1.8 10*3/uL (ref 0.7–4.0)
Lymphocytes Relative: 25.4 % (ref 12.0–46.0)
MCHC: 30.8 g/dL (ref 30.0–36.0)
MCV: 70.3 fl — AB (ref 78.0–100.0)
MONOS PCT: 5.4 % (ref 3.0–12.0)
Monocytes Absolute: 0.4 10*3/uL (ref 0.1–1.0)
NEUTROS ABS: 4.8 10*3/uL (ref 1.4–7.7)
NEUTROS PCT: 67.6 % (ref 43.0–77.0)
PLATELETS: 296 10*3/uL (ref 150.0–400.0)
RBC: 4.91 Mil/uL (ref 3.87–5.11)
RDW: 19.2 % — ABNORMAL HIGH (ref 11.5–15.5)
WBC: 7.1 10*3/uL (ref 4.0–10.5)

## 2015-06-26 LAB — LIPID PANEL
Cholesterol: 128 mg/dL (ref 0–200)
HDL: 43.2 mg/dL (ref >39.00)
LDL Cholesterol: 70 mg/dL (ref 0–99)
NonHDL: 84.35
Total CHOL/HDL Ratio: 3
Triglycerides: 70 mg/dL (ref 0.0–149.0)
VLDL: 14 mg/dL (ref 0.0–40.0)

## 2015-06-26 LAB — VITAMIN D 25 HYDROXY (VIT D DEFICIENCY, FRACTURES): VITD: 25.49 ng/mL — ABNORMAL LOW (ref 30.00–100.00)

## 2015-06-26 LAB — TSH: TSH: 1.53 u[IU]/mL (ref 0.35–4.50)

## 2015-06-26 MED ORDER — BETAMETHASONE DIPROPIONATE 0.05 % EX CREA: TOPICAL_CREAM | Freq: Two times a day (BID) | CUTANEOUS | Status: DC

## 2015-06-26 NOTE — Patient Instructions (Signed)
Please complete lab work prior to leaving. Work on healthy, low sodium diet, exercise and weight loss.

## 2015-06-26 NOTE — Assessment & Plan Note (Signed)
Discussed healthy diet, exercise, weight loss. Obtain routine labs.  Flu shot today.refer for mammogram.

## 2015-06-26 NOTE — Assessment & Plan Note (Signed)
Skin rash most consistent with eczema- I don't think it is psoriasis. Trial of betamethasone.

## 2015-06-26 NOTE — Progress Notes (Signed)
Subjective:    Patient ID: Brittany Johnson, female    DOB: [birth year], 45 y.o.   MRN: [id number]  HPI  Brittany Johnson is a 45 yr old female who presents today for cpx.  Patient presents today for complete physical.  Immunizations: tetanus up to date. Flu shot today.  Diet: reports that her diet could be better.   Exercise: 3 times a month.  Enjoys playing with her kids.  Park with kids/walking Pap Smear: 2 yrs ago- normal per pt. Brittany Johnson CNM) Mammogram:4/15 Dental: up to date Vision:  Up to date  BP Readings from Last 3 Encounters:  06/26/15 140/83  01/12/15 130/84  01/09/15 135/80   Hepatitis carrier- pt not sure if Hep B or Hep A. Mom has hepatitis and is from [place].   Review of Systems  Constitutional: Negative for unexpected weight change.  HENT: Negative for hearing loss and rhinorrhea.   Eyes: Negative for visual disturbance.  Respiratory: Negative for cough.   Cardiovascular: Positive for leg swelling.       Reports some LE "indentation" when she takes her socks off.    Gastrointestinal: Negative for vomiting, diarrhea, constipation and anal bleeding.  Genitourinary: Negative for dysuria, frequency and menstrual problem.  Musculoskeletal: Negative for myalgias and arthralgias.  Skin: Positive for rash.       Reports hx of psoriasis (hands/ankles)   Neurological: Negative for headaches.  Hematological: Negative for adenopathy.  Psychiatric/Behavioral:       Denies depression/anxiety       Past Medical History  Diagnosis Date  . Hypertension   . Chicken pox as a child  . Hepatitis C carrier 02/07/2011  . Hx: UTI (urinary tract infection) 02/07/2011  . Abnormal cervical cytology 02/07/2011  . Obese 03/04/2011  . Tinea pedis of right foot 03/07/2011  . Psoriasis 09/02/2011  . Left lateral epicondylitis 09/02/2011  . Right shoulder strain 03/29/2012  . Hypokalemia 05/25/2012  . Bronchitis 05/25/2012  . Palpitations 07/08/2012  . Menorrhagia 11/24/2012    . Pulmonary embolism ([place])   . Shingles     Social History   Social History  . Marital Status: Married    Spouse Name: N/A  . Number of Children: N/A  . Years of Education: N/A   Occupational History  . Not on file.   Social History Main Topics  . Smoking status: Never Smoker   . Smokeless tobacco: Never Used  . Alcohol Use: No  . Drug Use: No  . Sexual Activity:    Partners: Male    Birth Control/ Protection: None   Other Topics Concern  . Not on file   Social History Narrative    Past Surgical History  Procedure Laterality Date  . Dilation and curettage of uterus  2003    SAB    Family History  Problem Relation Age of Onset  . Diabetes Mother     type 2  . Hypertension Mother   . Heart attack Father   . Hypertension Father   . Cancer Father     prostate  . Cancer Paternal Grandmother     ?    No Known Allergies  No current outpatient prescriptions on file prior to visit.   No current facility-administered medications on file prior to visit.    BP 140/83 mmHg  Pulse 79  Temp(Src) 98.4 F (36.9 C) (Oral)  Resp 16  Ht 5\' 6"  (1.676 m)  Wt 203 lb 9.6 oz (92.352 kg)  BMI 32.88 kg/m2  SpO2 100%    Objective:   Physical Exam Physical Exam  Constitutional: She is oriented to person, place, and time. She appears well-developed and well-nourished. No distress.  HENT:  Head: Normocephalic and atraumatic.  Right Ear: Tympanic membrane and ear canal normal.  Left Ear: Tympanic membrane and ear canal normal.  Mouth/Throat: Oropharynx is clear and moist.  Eyes: Pupils are equal, round, and reactive to light. No scleral icterus.  Neck: Normal range of motion. No thyromegaly present.  Cardiovascular: Normal rate and regular rhythm.   No murmur heard. Pulmonary/Chest: Effort normal and breath sounds normal. No respiratory distress. He has no wheezes. She has no rales. She exhibits no tenderness.  Abdominal: Soft. Bowel sounds are normal. He exhibits  no distension and no mass. There is no tenderness. There is no rebound and no guarding.  Musculoskeletal: She exhibits no edema.  Lymphadenopathy:    She has no cervical adenopathy.  Neurological: She is alert and oriented to person, place, and time. She has normal patellar reflexes. She exhibits normal muscle tone. Coordination normal.  Skin: Skin is warm and dry. dry eczematous rash noted on hands and left lateral ankle Psychiatric: She has a normal mood and affect. Her behavior is normal. Judgment and thought content normal.  Breasts: Examined lying Right: Without masses, retractions, discharge or axillary adenopathy.  Left: Without masses, retractions, discharge or axillary adenopathy.  Pelvic: deferred         Assessment & Plan:       Assessment & Plan:  Reviewed chart and history with patient.  There is no evidence that she was ever diagnosed or treated for PE.   Hepatitis Carrier- obtain hepatitis panel. I suspect she is a hep B carrier as her mother is from [place] and hep B is common there.

## 2015-06-26 NOTE — Addendum Note (Signed)
Addended by: Caffie Pinto on: 06/26/2015 03:00 PM   Modules accepted: Orders

## 2015-06-26 NOTE — Progress Notes (Signed)
Pre visit review using our clinic review tool, if applicable. No additional management support is needed unless otherwise documented below in the visit note. 

## 2015-06-27 LAB — URINALYSIS, ROUTINE W REFLEX MICROSCOPIC
Bilirubin Urine: NEGATIVE
GLUCOSE, UA: NEGATIVE
Ketones, ur: NEGATIVE
Nitrite: NEGATIVE
PH: 6.5 (ref 5.0–8.0)
Specific Gravity, Urine: 1.017 (ref 1.001–1.035)

## 2015-06-27 LAB — URINALYSIS, MICROSCOPIC ONLY
Bacteria, UA: NONE SEEN [HPF]
CASTS: NONE SEEN [LPF]
CRYSTALS: NONE SEEN [HPF]
SQUAMOUS EPITHELIAL / LPF: NONE SEEN [HPF] (ref <=5)
YEAST: NONE SEEN [HPF]

## 2015-06-28 LAB — HEPATITIS B SURFACE ANTIBODY,QUALITATIVE: HEP B S AB: NEGATIVE

## 2015-06-28 LAB — HEPATITIS B SURF AG CONFIRMATION: HEPATITIS B SURFACE ANTIGEN CONFIRMATION: POSITIVE — AB

## 2015-06-28 LAB — HEPATITIS B CORE ANTIBODY, IGM: Hep B C IgM: NONREACTIVE

## 2015-06-28 LAB — HEPATITIS B CORE ANTIBODY, TOTAL: Hep B Core Total Ab: REACTIVE — AB

## 2015-06-28 LAB — HEPATITIS B SURFACE ANTIGEN: Hepatitis B Surface Ag: POSITIVE — AB

## 2015-06-28 LAB — HEPATITIS C ANTIBODY: HCV AB: NEGATIVE

## 2015-06-29 ENCOUNTER — Other Ambulatory Visit (INDEPENDENT_AMBULATORY_CARE_PROVIDER_SITE_OTHER): Payer: BLUE CROSS/BLUE SHIELD

## 2015-06-29 ENCOUNTER — Other Ambulatory Visit: Payer: Self-pay | Admitting: Family

## 2015-06-29 DIAGNOSIS — B181 Chronic viral hepatitis B without delta-agent: Secondary | ICD-10-CM

## 2015-06-29 DIAGNOSIS — D649 Anemia, unspecified: Secondary | ICD-10-CM

## 2015-06-29 DIAGNOSIS — E559 Vitamin D deficiency, unspecified: Secondary | ICD-10-CM

## 2015-06-29 MED ORDER — VITAMIN D (ERGOCALCIFEROL) 1.25 MG (50000 UNIT) PO CAPS: 50000.0000 [IU] | ORAL_CAPSULE | ORAL | Status: DC

## 2015-06-29 NOTE — Telephone Encounter (Addendum)
Please let pt know that her lab work shows chronic hepatitis B.  Negative for Hep C.  I would like to refer her to Hepatology for consideration of treatment for chronic hep B.   Thyroid, liver and cholesterol looks good.   Vitamin D is low.  Advise patient to begin vit D 50000 units once weekly for 12 weeks, then repeat vit D level (dx Vit D deficiency).   She is anemic.  I would like her to complete IFOB and start otc iron supplement 325mg  bid.  (please also ask lab to add on serum iron) repeat CBC/iron in 3 months.  Potassium is low. Focus on high potassium foods.  Repeat bmet in 2 weeks. Dx hypokalemia.  Urine + blood. Did she have her period that day?

## 2015-06-29 NOTE — Telephone Encounter (Signed)
Notified pt and she voices understanding and is agreeable to proceed with GI referral. IFOB kit placed at front desk for pick up and order placed. Pt will complete repeat CBC/iron and vitamin D level at follow up in April. Lab appt scheduled for repeat potassium on 07/10/15 at 9:30am and future order entered. Pt reports that she had just started her menstrual cycle the day of her visit.

## 2015-06-29 NOTE — Addendum Note (Signed)
Addended by: Kelle Darting A on: 06/29/2015 11:38 AM   Modules accepted: Orders

## 2015-06-29 NOTE — Telephone Encounter (Signed)
Received message that the lab is unable to add on serum Iron. Spoke with pt and she will go to the lab when she picks up IFOB today around 3pm. Future order entered.

## 2015-06-30 LAB — CBC WITH DIFFERENTIAL/PLATELET
BASOS ABS: 0.1 10*3/uL (ref 0.0–0.1)
Basophils Relative: 1 % (ref 0.0–3.0)
EOS PCT: 2.3 % (ref 0.0–5.0)
Eosinophils Absolute: 0.2 10*3/uL (ref 0.0–0.7)
HEMATOCRIT: 30.3 % — AB (ref 36.0–46.0)
HEMOGLOBIN: 9.4 g/dL — AB (ref 12.0–15.0)
LYMPHS PCT: 30.7 % (ref 12.0–46.0)
Lymphs Abs: 2.1 10*3/uL (ref 0.7–4.0)
MCHC: 31 g/dL (ref 30.0–36.0)
MCV: 70.7 fl — AB (ref 78.0–100.0)
MONOS PCT: 6 % (ref 3.0–12.0)
Monocytes Absolute: 0.4 10*3/uL (ref 0.1–1.0)
Neutro Abs: 4.1 10*3/uL (ref 1.4–7.7)
Neutrophils Relative %: 60 % (ref 43.0–77.0)
Platelets: 270 10*3/uL (ref 150.0–400.0)
RBC: 4.29 Mil/uL (ref 3.87–5.11)
RDW: 20 % — ABNORMAL HIGH (ref 11.5–15.5)
WBC: 6.8 10*3/uL (ref 4.0–10.5)

## 2015-06-30 LAB — IRON: IRON: 19 ug/dL — AB (ref 42–145)

## 2015-06-30 LAB — VITAMIN D 25 HYDROXY (VIT D DEFICIENCY, FRACTURES): VITD: 22.81 ng/mL — AB (ref 30.00–100.00)

## 2015-07-05 ENCOUNTER — Telehealth: Payer: Self-pay | Admitting: Family

## 2015-07-05 NOTE — Telephone Encounter (Signed)
Iron is very low, also + anemia.  Is she taking iron 325mg  bid?  If so, need to increase to tid.  I would like her to complete IFOB please.  Also, is she having heavy periods? Please repeat iron level and cbc in 2 months. Vitamin D level is low.  Is she taking weekly vit d?  If not please advise patient to begin vit D 50000 units once weekly for 12 weeks, then repeat vit D level (dx Vit D deficiency).  OK to send refill.

## 2015-07-06 ENCOUNTER — Other Ambulatory Visit (INDEPENDENT_AMBULATORY_CARE_PROVIDER_SITE_OTHER): Payer: BLUE CROSS/BLUE SHIELD

## 2015-07-06 DIAGNOSIS — D649 Anemia, unspecified: Secondary | ICD-10-CM

## 2015-07-06 LAB — FECAL OCCULT BLOOD, IMMUNOCHEMICAL: FECAL OCCULT BLD: NEGATIVE

## 2015-07-06 NOTE — Telephone Encounter (Signed)
Melissa-- please see 06/29/15 refill note; looks like this has already been discussed.  Thanks!

## 2015-07-10 ENCOUNTER — Other Ambulatory Visit: Payer: BLUE CROSS/BLUE SHIELD

## 2015-07-24 ENCOUNTER — Ambulatory Visit (HOSPITAL_BASED_OUTPATIENT_CLINIC_OR_DEPARTMENT_OTHER)
Admission: RE | Admit: 2015-07-24 | Discharge: 2015-07-24 | Disposition: A | Discharge status: Home / Self Care | Payer: BLUE CROSS/BLUE SHIELD | Source: Ambulatory Visit | Attending: Family | Admitting: Family

## 2015-07-24 DIAGNOSIS — Z0000 Encounter for general adult medical examination without abnormal findings: Secondary | ICD-10-CM | POA: Diagnosis present

## 2015-07-24 DIAGNOSIS — Z1231 Encounter for screening mammogram for malignant neoplasm of breast: Secondary | ICD-10-CM | POA: Diagnosis not present

## 2015-08-05 ENCOUNTER — Other Ambulatory Visit (HOSPITAL_COMMUNITY): Payer: Self-pay | Admitting: Physician Assistant

## 2015-08-05 DIAGNOSIS — B182 Chronic viral hepatitis C: Secondary | ICD-10-CM

## 2015-08-12 ENCOUNTER — Ambulatory Visit (HOSPITAL_COMMUNITY)
Admission: RE | Admit: 2015-08-12 | Discharge: 2015-08-12 | Disposition: A | Discharge status: Home / Self Care | Payer: BLUE CROSS/BLUE SHIELD | Source: Ambulatory Visit | Attending: Physician Assistant | Admitting: Physician Assistant

## 2015-08-12 DIAGNOSIS — B182 Chronic viral hepatitis C: Secondary | ICD-10-CM

## 2015-09-02 ENCOUNTER — Ambulatory Visit (HOSPITAL_COMMUNITY): Payer: BLUE CROSS/BLUE SHIELD

## 2015-09-25 ENCOUNTER — Emergency Department (HOSPITAL_BASED_OUTPATIENT_CLINIC_OR_DEPARTMENT_OTHER)
Admission: EM | Admit: 2015-09-25 | Discharge: 2015-09-25 | Disposition: A | Discharge status: Home / Self Care | Payer: BLUE CROSS/BLUE SHIELD | Attending: Emergency Medicine | Admitting: Emergency Medicine

## 2015-09-25 ENCOUNTER — Encounter (HOSPITAL_BASED_OUTPATIENT_CLINIC_OR_DEPARTMENT_OTHER): Payer: Self-pay | Admitting: *Deleted

## 2015-09-25 DIAGNOSIS — Z79899 Other long term (current) drug therapy: Secondary | ICD-10-CM | POA: Insufficient documentation

## 2015-09-25 DIAGNOSIS — I10 Essential (primary) hypertension: Secondary | ICD-10-CM | POA: Insufficient documentation

## 2015-09-25 DIAGNOSIS — Z8619 Personal history of other infectious and parasitic diseases: Secondary | ICD-10-CM | POA: Diagnosis not present

## 2015-09-25 DIAGNOSIS — Z791 Long term (current) use of non-steroidal anti-inflammatories (NSAID): Secondary | ICD-10-CM | POA: Insufficient documentation

## 2015-09-25 DIAGNOSIS — Z8742 Personal history of other diseases of the female genital tract: Secondary | ICD-10-CM | POA: Diagnosis not present

## 2015-09-25 DIAGNOSIS — E669 Obesity, unspecified: Secondary | ICD-10-CM | POA: Insufficient documentation

## 2015-09-25 DIAGNOSIS — Z8709 Personal history of other diseases of the respiratory system: Secondary | ICD-10-CM | POA: Diagnosis not present

## 2015-09-25 DIAGNOSIS — Z87440 Personal history of urinary (tract) infections: Secondary | ICD-10-CM | POA: Insufficient documentation

## 2015-09-25 DIAGNOSIS — Z872 Personal history of diseases of the skin and subcutaneous tissue: Secondary | ICD-10-CM | POA: Insufficient documentation

## 2015-09-25 DIAGNOSIS — M436 Torticollis: Secondary | ICD-10-CM | POA: Insufficient documentation

## 2015-09-25 DIAGNOSIS — M542 Cervicalgia: Secondary | ICD-10-CM | POA: Diagnosis present

## 2015-09-25 MED ORDER — KETOROLAC TROMETHAMINE 60 MG/2ML IM SOLN
60.0000 mg | Freq: Once | INTRAMUSCULAR | Status: AC
  Administered 2015-09-25: 60 mg via INTRAMUSCULAR
  Filled 2015-09-25: qty 2

## 2015-09-25 MED ORDER — METHOCARBAMOL 1000 MG/10ML IJ SOLN
500.0000 mg | Freq: Once | INTRAMUSCULAR | Status: AC
  Administered 2015-09-25: 500 mg via INTRAMUSCULAR
  Filled 2015-09-25: qty 10

## 2015-09-25 MED ORDER — NAPROXEN 500 MG PO TABS: 500.0000 mg | ORAL_TABLET | Freq: Two times a day (BID) | ORAL | Status: DC

## 2015-09-25 MED ORDER — METHOCARBAMOL 500 MG PO TABS: 500.0000 mg | ORAL_TABLET | Freq: Two times a day (BID) | ORAL | Status: DC

## 2015-09-25 MED FILL — METHOCARBAMOL 500 MG TABLET: 500 | 10 days supply | Qty: 20 | Fill #0

## 2015-09-25 MED FILL — NAPROXEN 500 MG TABLET: 500 | 7 days supply | Qty: 14 | Fill #0

## 2015-09-25 NOTE — ED Provider Notes (Signed)
CSN: [id number]     Arrival date & time 09/25/15  [id number] History   First MD Initiated Contact with Patient 09/25/15 [phone]     No chief complaint on file.    The history is provided by the patient. No language interpreter was used.   Brittany Johnson is a 45 y.o. female who presents to the Emergency Department complaining of Neck pain. She reports 3 weeks of mild right-sided neck pain that has been worse with her turning her head to the right. Yesterday she developed increased pain in her right neck laterally and posteriorly. She is now unable to turn her neck to the right due to discomfort. She feels like she is possibly swollen on the right side of her neck. She feels also swollen in her right posterior upper neck. She has pain in her neck with swelling on the right. She denies any fevers, chills, chest pain, shortness of breath, abdominal pain, vomiting, numbness, weakness, malaise. Symptoms are moderate, constant, worsening. She tried ibuprofen and Mucinex yesterday without any significant improvement in her symptoms.  Past Medical History  Diagnosis Date  . Hypertension   . Chicken pox as a child  . Hepatitis C carrier 02/07/2011  . Hx: UTI (urinary tract infection) 02/07/2011  . Abnormal cervical cytology 02/07/2011  . Obese 03/04/2011  . Tinea pedis of right foot 03/07/2011  . Psoriasis 09/02/2011  . Left lateral epicondylitis 09/02/2011  . Right shoulder strain 03/29/2012  . Hypokalemia 05/25/2012  . Bronchitis 05/25/2012  . Palpitations 07/08/2012  . Menorrhagia 11/24/2012  . Shingles    Past Surgical History  Procedure Laterality Date  . Dilation and curettage of uterus  2003    SAB   Family History  Problem Relation Age of Onset  . Diabetes Mother     type 2  . Hypertension Mother   . Heart attack Father   . Hypertension Father   . Cancer Father     prostate  . Cancer Paternal Grandmother     ?  Marland Kitchen Hepatitis Mother     ? Hep B or Hep C   Social History  Substance Use  Topics  . Smoking status: Never Smoker   . Smokeless tobacco: Never Used  . Alcohol Use: No   OB History    Gravida Para Term Preterm AB TAB SAB Ectopic Multiple Living   4 2   2  2   2      Review of Systems  All other systems reviewed and are negative.     Allergies  Review of patient's allergies indicates no known allergies.  Home Medications   Prior to Admission medications   Medication Sig Start Date End Date Taking? Authorizing Provider  betamethasone dipropionate (DIPROLENE) 0.05 % cream Apply topically 2 (two) times daily. 06/26/15  Yes [name], NP  ferrous sulfate 325 (65 FE) MG tablet Take 325 mg by mouth 2 (two) times daily with a meal.   Yes Historical Provider, MD  loratadine (CLARITIN) 10 MG tablet Take 10 mg by mouth daily as needed for allergies.   Yes Historical Provider, MD  Vitamin D, Ergocalciferol, (DRISDOL) 50000 units CAPS capsule Take 1 capsule (50,000 Units total) by mouth every 7 (seven) days. 06/29/15  Yes [name], NP  methocarbamol (ROBAXIN) 500 MG tablet Take 1 tablet (500 mg total) by mouth 2 (two) times daily. 09/25/15   Quintella Reichert, MD  naproxen (NAPROSYN) 500 MG tablet Take 1 tablet (500 mg total) by mouth 2 (two) times  daily with a meal. 09/25/15   Quintella Reichert, MD   BP 139/69 mmHg  Pulse 82  Temp(Src) 98.5 F (36.9 C) (Oral)  Resp 20  Ht 5\' 2"  (1.575 m)  Wt 195 lb (88.451 kg)  BMI 35.66 kg/m2  SpO2 98%  LMP 09/14/2015 Physical Exam  Constitutional: She is oriented to person, place, and time. She appears well-developed and well-nourished.  HENT:  Head: Normocephalic and atraumatic.  TMs clear without any erythema or effusions. Oropharynx is clear without any swelling or erythema.  Neck:  There is mild tenderness to palpation over the right paracervical and lateral muscles. There is no palpable swelling, no masses. Patient is able to rotate to the left a few degrees and cannot rotate her neck to the right.   Cardiovascular: Normal rate and regular rhythm.   No murmur heard. Pulmonary/Chest: Effort normal and breath sounds normal. No stridor. No respiratory distress.  Abdominal: Soft. There is no tenderness. There is no rebound and no guarding.  Musculoskeletal: She exhibits no edema or tenderness.  2+ radial pulses bilaterally  Lymphadenopathy:    She has no cervical adenopathy.  Neurological: She is alert and oriented to person, place, and time.  5 out of 5 strength in all 4 extremities, sensation to light touch intact in all 4 extremities.  Skin: Skin is warm and dry.  Psychiatric: She has a normal mood and affect. Her behavior is normal.  Nursing note and vitals reviewed.   ED Course  Procedures (including critical care time) Labs Review Labs Reviewed - No data to display  Imaging Review No results found. I have personally reviewed and evaluated these images and lab results as part of my medical decision-making.   EKG Interpretation None      MDM   Final diagnoses:  Torticollis, acute    Patient here for evaluation of right lateral neck pain. She is nontoxic appearing on examination with no neurologic deficits. Patient with torticollis. Presentation and exam is not consistent with acute deep tissue infection, dissection, SAH. Treating symptoms with muscle relaxants, and sense. Discussed home care, outpatient follow-up, return precautions.  On repeat evaluation following treatment in the emergency department she does feel improved and is able to range her neck.  Quintella Reichert, MD 09/25/15 [phone]

## 2015-09-25 NOTE — Discharge Instructions (Signed)
Acute Torticollis °Torticollis is a condition in which the muscles of the neck tighten (contract) abnormally, causing the neck to twist and the head to move into an unnatural position. Torticollis that develops suddenly is called acute torticollis. If torticollis becomes chronic and is left untreated, the face and neck can become deformed. °CAUSES °This condition may be caused by: °· Sleeping in an awkward position (common). °· Extending or twisting the neck muscles beyond their normal position. °· Infection. °In some cases, the cause may not be known. °SYMPTOMS °Symptoms of this condition include: °· An unnatural position of the head. °· Neck pain. °· A limited ability to move the neck. °· Twisting of the neck to one side. °DIAGNOSIS °This condition is diagnosed with a physical exam. You may also have imaging tests, such as an X-ray, CT scan, or MRI. °TREATMENT °Treatment for this condition involves trying to relax the neck muscles. It may include: °· Medicines or shots. °· Physical therapy. °· Surgery. This may be done in severe cases. °HOME CARE INSTRUCTIONS °· Take medicines only as directed by your health care provider. °· Do stretching exercises and massage your neck as directed by your health care provider. °· Keep all follow-up visits as directed by your health care provider. This is important. °SEEK MEDICAL CARE IF: °· You develop a fever. °SEEK IMMEDIATE MEDICAL CARE IF: °· You develop difficulty breathing. °· You develop noisy breathing (stridor). °· You start drooling. °· You have trouble swallowing or have pain with swallowing. °· You develop numbness or weakness in your hands or feet. °· You have changes in your speech, understanding, or vision. °· Your pain gets worse. °  °This information is not intended to replace advice given to you by your health care provider. Make sure you discuss any questions you have with your health care provider. °  °Document Released: 05/27/2000 Document Revised:  10/14/2014 Document Reviewed: 05/26/2014 °Elsevier Interactive Patient Education ©2016 [employer] ° °

## 2015-09-25 NOTE — ED Notes (Signed)
C/o pain in back of neck started a few weeks ago and now hurts to move. Pain is in back of head. NO injury.

## 2015-09-29 ENCOUNTER — Ambulatory Visit (HOSPITAL_COMMUNITY)
Admission: RE | Admit: 2015-09-29 | Discharge: 2015-09-29 | Disposition: A | Discharge status: Home / Self Care | Payer: BLUE CROSS/BLUE SHIELD | Source: Ambulatory Visit | Attending: Physician Assistant | Admitting: Physician Assistant

## 2015-09-29 DIAGNOSIS — B182 Chronic viral hepatitis C: Secondary | ICD-10-CM | POA: Diagnosis present

## 2015-09-29 DIAGNOSIS — N29 Other disorders of kidney and ureter in diseases classified elsewhere: Secondary | ICD-10-CM | POA: Diagnosis not present

## 2015-09-29 DIAGNOSIS — N281 Cyst of kidney, acquired: Secondary | ICD-10-CM | POA: Diagnosis not present

## 2015-09-30 ENCOUNTER — Telehealth: Payer: Self-pay | Admitting: Family

## 2015-09-30 NOTE — Telephone Encounter (Signed)
Left detailed message on [name]'s voicemail that pt has 2 children born 2001 and 2005 per historical notes and to call if any further questions.

## 2015-09-30 NOTE — Telephone Encounter (Signed)
Caller name: Langley Gauss  Relation to pt: [facility] Department Call back number: [phone]   Reason for call:  Would like to know if patient is pregnant and if so when did she give birth?

## 2015-10-01 ENCOUNTER — Telehealth: Payer: Self-pay | Admitting: Family

## 2015-10-01 DIAGNOSIS — N29 Other disorders of kidney and ureter in diseases classified elsewhere: Principal | ICD-10-CM

## 2015-10-01 NOTE — Telephone Encounter (Signed)
Caller name:Dawn Relationship to patient:[facility] Can be reached:[phone] Pharmacy:  Reason for call:She would like to talk to you about this patient.  She had an ultrasound this week that incidentially noted medullary nephrocalinosis.  It is something that needs follow up.

## 2015-10-02 NOTE — Telephone Encounter (Signed)
Spoke with [employer] Please contact pt and let her know that I spoke with the liver specialist and that they saw some calcifications in her kidneys. I would like her to see a kidney specialist.

## 2015-10-02 NOTE — Telephone Encounter (Signed)
Left detailed message on voicemail and signed referral.

## 2015-10-07 ENCOUNTER — Ambulatory Visit (HOSPITAL_COMMUNITY): Payer: BLUE CROSS/BLUE SHIELD

## 2015-10-09 ENCOUNTER — Ambulatory Visit (INDEPENDENT_AMBULATORY_CARE_PROVIDER_SITE_OTHER): Payer: BLUE CROSS/BLUE SHIELD | Admitting: Family

## 2015-10-09 ENCOUNTER — Encounter: Payer: Self-pay | Admitting: Family

## 2015-10-09 ENCOUNTER — Ambulatory Visit: Payer: BLUE CROSS/BLUE SHIELD | Admitting: Family Medicine

## 2015-10-09 ENCOUNTER — Telehealth: Payer: Self-pay | Admitting: *Deleted

## 2015-10-09 VITALS — BP 126/80 | HR 71 | Temp 98.5°F | Resp 16 | Ht 62.0 in | Wt 202.0 lb

## 2015-10-09 DIAGNOSIS — D500 Iron deficiency anemia secondary to blood loss (chronic): Secondary | ICD-10-CM

## 2015-10-09 DIAGNOSIS — D649 Anemia, unspecified: Secondary | ICD-10-CM | POA: Diagnosis not present

## 2015-10-09 DIAGNOSIS — E559 Vitamin D deficiency, unspecified: Secondary | ICD-10-CM

## 2015-10-09 DIAGNOSIS — E876 Hypokalemia: Secondary | ICD-10-CM | POA: Diagnosis not present

## 2015-10-09 DIAGNOSIS — N920 Excessive and frequent menstruation with regular cycle: Secondary | ICD-10-CM | POA: Diagnosis not present

## 2015-10-09 LAB — CBC WITH DIFFERENTIAL/PLATELET
BASOS PCT: 0.5 % (ref 0.0–3.0)
Basophils Absolute: 0 10*3/uL (ref 0.0–0.1)
EOS PCT: 2.4 % (ref 0.0–5.0)
Eosinophils Absolute: 0.2 10*3/uL (ref 0.0–0.7)
HCT: 34.6 % — ABNORMAL LOW (ref 36.0–46.0)
HEMOGLOBIN: 11 g/dL — AB (ref 12.0–15.0)
LYMPHS PCT: 27.7 % (ref 12.0–46.0)
Lymphs Abs: 2 10*3/uL (ref 0.7–4.0)
MCHC: 31.7 g/dL (ref 30.0–36.0)
MCV: 74.4 fl — AB (ref 78.0–100.0)
Monocytes Absolute: 0.3 10*3/uL (ref 0.1–1.0)
Monocytes Relative: 4.9 % (ref 3.0–12.0)
NEUTROS ABS: 4.6 10*3/uL (ref 1.4–7.7)
Neutrophils Relative %: 64.5 % (ref 43.0–77.0)
PLATELETS: 251 10*3/uL (ref 150.0–400.0)
RBC: 4.65 Mil/uL (ref 3.87–5.11)
RDW: 17.3 % — AB (ref 11.5–15.5)
WBC: 7.1 10*3/uL (ref 4.0–10.5)

## 2015-10-09 LAB — BASIC METABOLIC PANEL
BUN: 13 mg/dL (ref 6–23)
CALCIUM: 8.8 mg/dL (ref 8.4–10.5)
CO2: 25 mEq/L (ref 19–32)
CREATININE: 0.97 mg/dL (ref 0.40–1.20)
Chloride: 105 mEq/L (ref 96–112)
GFR: 65.68 mL/min (ref >60.00)
GLUCOSE: 129 mg/dL — AB (ref 70–99)
Potassium: 3.7 mEq/L (ref 3.5–5.1)
Sodium: 136 mEq/L (ref 135–145)

## 2015-10-09 LAB — IRON: IRON: 19 ug/dL — AB (ref 42–145)

## 2015-10-09 LAB — VITAMIN D 25 HYDROXY (VIT D DEFICIENCY, FRACTURES): VITD: 27.06 ng/mL — ABNORMAL LOW (ref 30.00–100.00)

## 2015-10-09 NOTE — Progress Notes (Signed)
Subjective:    Patient ID: Brittany Johnson, female    DOB: 01-Jun-1971, 45 y.o.   MRN: [id number]  HPI  Brittany Johnson presents today for follow up.   She did have ED visit with torticollis.  Reports that her symptoms have improved.  Vit D deficiency- completed vitamin D 12 week course.   Anemia- reports very heavy menses.  Feels that her periods have been getting gradually heavier through the years. IFOB was negative. Taking iron once daily. + PICA, ice.   Lab Results  Component Value Date   WBC 6.8 06/29/2015   HGB 9.4* 06/29/2015   HCT 30.3* 06/29/2015   MCV 70.7* 06/29/2015   PLT 270.0 06/29/2015   Hypokalemia- rare "charley horse". Not currently on a supplement. Drinking OJ in morning but notes that she is trying to watch her sugar so she would like alternative options for potassium.    Review of Systems  Cardiovascular: Negative for palpitations.  Neurological: Negative for dizziness and light-headedness.   Past Medical History  Diagnosis Date  . Hypertension   . Chicken pox as a child  . Hepatitis C carrier 02/07/2011  . Hx: UTI (urinary tract infection) 02/07/2011  . Abnormal cervical cytology 02/07/2011  . Obese 03/04/2011  . Tinea pedis of right foot 03/07/2011  . Psoriasis 09/02/2011  . Left lateral epicondylitis 09/02/2011  . Right shoulder strain 03/29/2012  . Hypokalemia 05/25/2012  . Bronchitis 05/25/2012  . Palpitations 07/08/2012  . Menorrhagia 11/24/2012  . Shingles      Social History   Social History  . Marital Status: Married    Spouse Name: N/A  . Number of Children: N/A  . Years of Education: N/A   Occupational History  . Not on file.   Social History Main Topics  . Smoking status: Never Smoker   . Smokeless tobacco: Never Used  . Alcohol Use: No  . Drug Use: No  . Sexual Activity:    Partners: Male    Birth Control/ Protection: None   Other Topics Concern  . Not on file   Social History Narrative   Married for 23 yrs   2 step  children- grown   2001 Son   2005 daughter   Works as an Scientist, water quality at [employer]   Completed bachelor's degree   Enjoys outdoor activities, golf, reading   Restaurant manager, fast food    Past Surgical History  Procedure Laterality Date  . Dilation and curettage of uterus  2003    SAB    Family History  Problem Relation Age of Onset  . Diabetes Mother     type 2  . Hypertension Mother   . Heart attack Father   . Hypertension Father   . Cancer Father     prostate  . Cancer Paternal Grandmother     ?  Marland Kitchen Hepatitis Mother     ? Hep B or Hep C    No Known Allergies  Current Outpatient Prescriptions on File Prior to Visit  Medication Sig Dispense Refill  . betamethasone dipropionate (DIPROLENE) 0.05 % cream Apply topically 2 (two) times daily. 30 g 1  . ferrous sulfate 325 (65 FE) MG tablet Take 325 mg by mouth 2 (two) times daily with a meal.    . loratadine (CLARITIN) 10 MG tablet Take 10 mg by mouth daily as needed for allergies.    . methocarbamol (ROBAXIN) 500 MG tablet Take 1 tablet (500 mg total) by mouth 2 (two) times daily. 20 tablet  0  . naproxen (NAPROSYN) 500 MG tablet Take 1 tablet (500 mg total) by mouth 2 (two) times daily with a meal. 14 tablet 0  . Vitamin D, Ergocalciferol, (DRISDOL) 50000 units CAPS capsule Take 1 capsule (50,000 Units total) by mouth every 7 (seven) days. 12 capsule 0   No current facility-administered medications on file prior to visit.    BP 126/80 mmHg  Pulse 71  Temp(Src) 98.5 F (36.9 C) (Oral)  Resp 16  Ht 5\' 2"  (1.575 m)  Wt 202 lb (91.627 kg)  BMI 36.94 kg/m2  SpO2 99%  LMP 09/14/2015       Objective:   Physical Exam  Constitutional: She is oriented to person, place, and time. She appears well-developed and well-nourished.  HENT:  Head: Normocephalic and atraumatic.  Pale conjunctiva  Cardiovascular: Normal rate, regular rhythm and normal heart sounds.   No murmur heard. Pulmonary/Chest: Effort normal and  breath sounds normal. No respiratory distress. She has no wheezes.  Musculoskeletal: She exhibits no edema.  Neurological: She is alert and oriented to person, place, and time.  Psychiatric: She has a normal mood and affect. Her behavior is normal. Judgment and thought content normal.          Assessment & Plan:

## 2015-10-09 NOTE — Progress Notes (Signed)
Pre visit review using our clinic review tool, if applicable. No additional management support is needed unless otherwise documented below in the visit note. 

## 2015-10-09 NOTE — Telephone Encounter (Signed)
Pt called wanting to know why she was referred to a nephrologist. Per verbal from PCP, due to increased levels of calcium in the kidneys and needs further evaluation and recommendations from a specialist.

## 2015-10-09 NOTE — Assessment & Plan Note (Signed)
Obtain follow up bmet.  

## 2015-10-09 NOTE — Assessment & Plan Note (Signed)
Obtain follow up vit D level. If normal, transition to 3000 iu vit d once daily.

## 2015-10-09 NOTE — Assessment & Plan Note (Signed)
Repeat cbc. If still significantly anemic, refer to GYN for further evaluation.

## 2015-10-09 NOTE — Assessment & Plan Note (Signed)
Secondary to heavy menses. Obtain follow up cbc, iron levels. If significantly low, consider referral to hematology.

## 2015-10-09 NOTE — Patient Instructions (Addendum)
Please complete lab work prior to leaving.  We will contact you with further recommendations.    High potassium content foods  Highest content (>25 meq/100 g) High content (>6.2 meq/100 g)   Vegetables   Spinach   Tomatoes   Broccoli   Winter squash   Beets   Carrots   Cauliflower   Potatoes   Fruits   Bananas  Dried figs Cantaloupe  Molasses Kiwis  Seaweed Oranges  Very high content (>12.5 meq/100 g) Mangos  Dried fruits (dates, prunes) Meats  Nuts Ground beef  Avocados Steak  Bran cereals Pork  Wheat germ Veal  Lima beans Brittany Johnson

## 2015-10-10 ENCOUNTER — Telehealth: Payer: Self-pay | Admitting: Family

## 2015-10-10 DIAGNOSIS — E559 Vitamin D deficiency, unspecified: Secondary | ICD-10-CM

## 2015-10-10 DIAGNOSIS — N920 Excessive and frequent menstruation with regular cycle: Secondary | ICD-10-CM

## 2015-10-10 NOTE — Telephone Encounter (Signed)
Vitamin D is still low. Please confirm that she was taking the vit D 50000 iu weekly. If so, we need to increase to 2 x weekly and repeat vit D in 12 weeks.  Iron is still low.  I would like for her to increase her iron to one tab 3 times daily and meet with GYN to discuss heavy menses.  Repeat cbc with diff and serum iron in 12 weeks.

## 2015-10-12 MED ORDER — VITAMIN D (ERGOCALCIFEROL) 1.25 MG (50000 UNIT) PO CAPS: 50000.0000 [IU] | ORAL_CAPSULE | ORAL | Status: DC

## 2015-10-12 NOTE — Telephone Encounter (Signed)
Notified pt and she voices understanding. States she had been off of vitamin D x 4 weeks prior to last appt. Do you want me to send Vit D rx for once or twice weekly?  Pt has f/u with PCP on 01/08/16 and will complete vit D, cbc and serum iron at that appt.. Pt also states that she she was shopping this weekend and carrying bags on her hand. States it left an indention on her hand for 2 days and wanted PCP to be aware of that as she was asking about swelling at last OV. Please advise?

## 2015-10-12 NOTE — Telephone Encounter (Signed)
Rx sent and pt has been notified. 

## 2015-10-12 NOTE — Telephone Encounter (Signed)
Vit D once weekly please.  Call if swelling worsens.

## 2015-11-16 ENCOUNTER — Encounter: Payer: BLUE CROSS/BLUE SHIELD | Admitting: Obstetrics & Gynecology

## 2015-11-16 DIAGNOSIS — N920 Excessive and frequent menstruation with regular cycle: Secondary | ICD-10-CM

## 2016-01-08 ENCOUNTER — Telehealth: Payer: Self-pay | Admitting: Family

## 2016-01-08 ENCOUNTER — Encounter: Payer: Self-pay | Admitting: Family

## 2016-01-08 ENCOUNTER — Ambulatory Visit (INDEPENDENT_AMBULATORY_CARE_PROVIDER_SITE_OTHER): Payer: Self-pay | Admitting: Family

## 2016-01-08 VITALS — BP 130/88 | HR 69 | Temp 98.8°F | Resp 16 | Ht 62.0 in | Wt 204.4 lb

## 2016-01-08 DIAGNOSIS — B351 Tinea unguium: Secondary | ICD-10-CM | POA: Insufficient documentation

## 2016-01-08 DIAGNOSIS — I10 Essential (primary) hypertension: Secondary | ICD-10-CM

## 2016-01-08 DIAGNOSIS — D509 Iron deficiency anemia, unspecified: Secondary | ICD-10-CM

## 2016-01-08 DIAGNOSIS — D500 Iron deficiency anemia secondary to blood loss (chronic): Secondary | ICD-10-CM

## 2016-01-08 DIAGNOSIS — E559 Vitamin D deficiency, unspecified: Secondary | ICD-10-CM

## 2016-01-08 LAB — BASIC METABOLIC PANEL
BUN: 12 mg/dL (ref 6–23)
CO2: 24 meq/L (ref 19–32)
Calcium: 9.2 mg/dL (ref 8.4–10.5)
Chloride: 108 mEq/L (ref 96–112)
Creatinine, Ser: 1.03 mg/dL (ref 0.40–1.20)
GFR: 61.22 mL/min (ref >60.00)
GLUCOSE: 80 mg/dL (ref 70–99)
POTASSIUM: 3.8 meq/L (ref 3.5–5.1)
SODIUM: 138 meq/L (ref 135–145)

## 2016-01-08 LAB — VITAMIN D 25 HYDROXY (VIT D DEFICIENCY, FRACTURES): VITD: 31.13 ng/mL (ref 30.00–100.00)

## 2016-01-08 LAB — CBC WITH DIFFERENTIAL/PLATELET
BASOS PCT: 0.5 % (ref 0.0–3.0)
Basophils Absolute: 0 10*3/uL (ref 0.0–0.1)
EOS PCT: 2.2 % (ref 0.0–5.0)
Eosinophils Absolute: 0.1 10*3/uL (ref 0.0–0.7)
HEMATOCRIT: 31.6 % — AB (ref 36.0–46.0)
HEMOGLOBIN: 10.1 g/dL — AB (ref 12.0–15.0)
Lymphocytes Relative: 31.1 % (ref 12.0–46.0)
Lymphs Abs: 2 10*3/uL (ref 0.7–4.0)
MCHC: 31.9 g/dL (ref 30.0–36.0)
MCV: 72.9 fl — AB (ref 78.0–100.0)
Monocytes Absolute: 0.4 10*3/uL (ref 0.1–1.0)
Monocytes Relative: 6.7 % (ref 3.0–12.0)
NEUTROS ABS: 3.9 10*3/uL (ref 1.4–7.7)
Neutrophils Relative %: 59.5 % (ref 43.0–77.0)
Platelets: 282 10*3/uL (ref 150.0–400.0)
RBC: 4.34 Mil/uL (ref 3.87–5.11)
RDW: 18.3 % — AB (ref 11.5–15.5)
WBC: 6.6 10*3/uL (ref 4.0–10.5)

## 2016-01-08 LAB — IRON: Iron: 5 ug/dL — ABNORMAL LOW (ref 42–145)

## 2016-01-08 MED ORDER — EFINACONAZOLE 10 % EX SOLN: CUTANEOUS | 6 refills | Status: DC

## 2016-01-08 NOTE — Patient Instructions (Addendum)
Please complete lab work. For toenail fungus please begin Jublia.  (see rx coupon). Should be continued x 48 weeks.

## 2016-01-08 NOTE — Assessment & Plan Note (Signed)
Will avoid oral lamisil due to hep B history.  Trial of Jublia topical antifungal.  copay card printed for patient.

## 2016-01-08 NOTE — Assessment & Plan Note (Signed)
Check Vitamin D level 

## 2016-01-08 NOTE — Progress Notes (Signed)
Subjective:    Patient ID: Brittany Johnson, female    DOB: 1970/09/22, 45 y.o.   MRN: [id number]  HPI  Brittany Johnson is a 45 yr old female who presents today for follow up.  1) HTN- has hx of htn.  Not currently on antihypertensive. Does not some "fluid retention in her hands and feet.  BP Readings from Last 3 Encounters:  01/08/16 130/88  10/09/15 126/80  09/25/15 124/60   2) Vit D deficiency- was previously discontinued by nephrology a few months ago.  She was placed on potassium citrate.    3) Toenail fungus- notes on right toenails. Has tried applying "vicks" without improvement.   Review of Systems See HPI  Past Medical History:  Diagnosis Date  . Abnormal cervical cytology 02/07/2011  . Bronchitis 05/25/2012  . Chicken pox as a child  . Hepatitis C carrier 02/07/2011  . Hx: UTI (urinary tract infection) 02/07/2011  . Hypertension   . Hypokalemia 05/25/2012  . Left lateral epicondylitis 09/02/2011  . Menorrhagia 11/24/2012  . Obese 03/04/2011  . Palpitations 07/08/2012  . Psoriasis 09/02/2011  . Right shoulder strain 03/29/2012  . Shingles   . Tinea pedis of right foot 03/07/2011     Social History   Social History  . Marital status: Married    Spouse name: N/A  . Number of children: N/A  . Years of education: N/A   Occupational History  . Not on file.   Social History Main Topics  . Smoking status: Never Smoker  . Smokeless tobacco: Never Used  . Alcohol use No  . Drug use: No  . Sexual activity: Yes    Partners: Male    Birth control/ protection: None   Other Topics Concern  . Not on file   Social History Narrative   Married for 23 yrs   2 step children- grown   2001 Son   2005 daughter   Works as an Scientist, water quality at [employer]   Completed bachelor's degree   Enjoys outdoor activities, golf, reading   Restaurant manager, fast food    Past Surgical History:  Procedure Laterality Date  . DILATION AND CURETTAGE OF UTERUS  2003   SAB     Family History  Problem Relation Age of Onset  . Diabetes Mother     type 2  . Hypertension Mother   . Heart attack Father   . Hypertension Father   . Cancer Father     prostate  . Cancer Paternal Grandmother     ?  Marland Kitchen Hepatitis Mother     ? Hep B or Hep C    No Known Allergies  Current Outpatient Prescriptions on File Prior to Visit  Medication Sig Dispense Refill  . betamethasone dipropionate (DIPROLENE) 0.05 % cream Apply topically 2 (two) times daily. 30 g 1  . ferrous sulfate 325 (65 FE) MG tablet Take 325 mg by mouth 3 (three) times daily with meals.     Marland Kitchen loratadine (CLARITIN) 10 MG tablet Take 10 mg by mouth daily as needed for allergies.    . Vitamin D, Ergocalciferol, (DRISDOL) 50000 units CAPS capsule Take 1 capsule (50,000 Units total) by mouth every 7 (seven) days. (Patient not taking: Reported on 01/08/2016) 12 capsule 0   No current facility-administered medications on file prior to visit.     BP 130/88   Pulse 69   Temp 98.8 F (37.1 C) (Oral)   Resp 16   Ht 5\' 2"  (1.575 m)  Wt 204 lb 6.4 oz (92.7 kg)   LMP 12/18/2015   SpO2 98% Comment: ROOM ARI  BMI 37.39 kg/m       Objective:   Physical Exam  Constitutional: She is oriented to person, place, and time. She appears well-developed and well-nourished.  HENT:  Head: Normocephalic and atraumatic.  Cardiovascular: Normal rate, regular rhythm and normal heart sounds.   No murmur heard. Pulmonary/Chest: Effort normal and breath sounds normal. No respiratory distress. She has no wheezes.  Musculoskeletal: She exhibits no edema.  Neurological: She is alert and oriented to person, place, and time.  Skin:  Toenails on right foot are thickened/discolored.   Psychiatric: She has a normal mood and affect. Her behavior is normal. Judgment and thought content normal.          Assessment & Plan:

## 2016-01-08 NOTE — Progress Notes (Signed)
Pre visit review using our clinic review tool, if applicable. No additional management support is needed unless otherwise documented below in the visit note. 

## 2016-01-08 NOTE — Assessment & Plan Note (Signed)
BP stable of meds. We discussed limiting sodium in her diet.

## 2016-01-08 NOTE — Assessment & Plan Note (Signed)
Obtain follow up cbc and iron level  

## 2016-01-10 NOTE — Telephone Encounter (Signed)
Opened in error

## 2016-01-22 ENCOUNTER — Telehealth: Payer: Self-pay | Admitting: *Deleted

## 2016-01-22 NOTE — Telephone Encounter (Signed)
-----   Message from [name], NP sent at 01/08/2016  5:22 PM EDT ----- Iron is very low.  Is she taking iron TID? If not, increase to tid and f/u cbc, serum iron, ferritin in 3 months. If she is taking tid, we should refer to hematology for iron infusion.  Are periods very heavy? If so, I will refer her to GYN as well.

## 2016-02-08 NOTE — Telephone Encounter (Signed)
Pt has not read mychart message. Mailed letter.

## 2016-03-15 NOTE — Telephone Encounter (Addendum)
Patient would like to speak with you regarding letter mailed. Best [phone]

## 2016-03-16 NOTE — Telephone Encounter (Signed)
Left message for pt to return my call.

## 2016-03-17 NOTE — Telephone Encounter (Signed)
Patient returning call regarding message below.

## 2016-03-18 NOTE — Telephone Encounter (Signed)
Spoke with pt. She states she has been taking iron twice a day every day and sometimes three times a day. Advised her to take three times a day consistently and She voices understanding. Reports that she has appt with GYN next week re: heavy menstrual flow. Pt has f/u with PCP on 06/21/16 and will have labs repeated at that visit.

## 2016-03-28 ENCOUNTER — Ambulatory Visit (INDEPENDENT_AMBULATORY_CARE_PROVIDER_SITE_OTHER): Payer: Self-pay | Admitting: Obstetrics & Gynecology

## 2016-03-28 ENCOUNTER — Encounter: Payer: Self-pay | Admitting: Obstetrics & Gynecology

## 2016-03-28 VITALS — BP 131/82 | HR 76 | Ht 62.0 in | Wt 211.0 lb

## 2016-03-28 DIAGNOSIS — Z01419 Encounter for gynecological examination (general) (routine) without abnormal findings: Secondary | ICD-10-CM

## 2016-03-28 DIAGNOSIS — N920 Excessive and frequent menstruation with regular cycle: Secondary | ICD-10-CM

## 2016-03-28 DIAGNOSIS — Z1151 Encounter for screening for human papillomavirus (HPV): Secondary | ICD-10-CM

## 2016-03-28 DIAGNOSIS — D500 Iron deficiency anemia secondary to blood loss (chronic): Secondary | ICD-10-CM

## 2016-03-28 DIAGNOSIS — Z124 Encounter for screening for malignant neoplasm of cervix: Secondary | ICD-10-CM

## 2016-03-28 DIAGNOSIS — D259 Leiomyoma of uterus, unspecified: Secondary | ICD-10-CM

## 2016-03-28 MED ORDER — NORGESTIMATE-ETH ESTRADIOL 0.25-35 MG-MCG PO TABS: 1.0000 | ORAL_TABLET | Freq: Every day | ORAL | 11 refills | Status: DC

## 2016-03-28 MED ORDER — INTEGRA F 125-1 MG PO CAPS: 1.0000 | ORAL_CAPSULE | Freq: Every day | ORAL | 1 refills | Status: DC

## 2016-03-28 NOTE — Patient Instructions (Signed)
Uterine Fibroids Uterine fibroids are tissue masses (tumors) that can develop in the womb (uterus). They are also called leiomyomas. This type of tumor is not cancerous (benign) and does not spread to other parts of the body outside of the pelvic area, which is between the hip bones. Occasionally, fibroids may develop in the fallopian tubes, in the cervix, or on the support structures (ligaments) that surround the uterus. You can have one or many fibroids. Fibroids can vary in size, weight, and where they grow in the uterus. Some can become quite large. Most fibroids do not require medical treatment. CAUSES A fibroid can develop when a single uterine cell keeps growing (replicating). Most cells in the human body have a control mechanism that keeps them from replicating without control. SIGNS AND SYMPTOMS Symptoms may include:   Heavy bleeding during your period.  Bleeding or spotting between periods.  Pelvic pain and pressure.  Bladder problems, such as needing to urinate more often (urinary frequency) or urgently.  Inability to reproduce offspring (infertility).  Miscarriages. DIAGNOSIS Uterine fibroids are diagnosed through a physical exam. Your health care provider may feel the lumpy tumors during a pelvic exam. Ultrasonography and an MRI may be done to determine the size, location, and number of fibroids. TREATMENT Treatment may include:  Watchful waiting. This involves getting the fibroid checked by your health care provider to see if it grows or shrinks. Follow your health care provider's recommendations for how often to have this checked.  Hormone medicines. These can be taken by mouth or given through an intrauterine device (IUD).  Surgery.  Removing the fibroids (myomectomy) or the uterus (hysterectomy).  Removing blood supply to the fibroids (uterine artery embolization). If fibroids interfere with your fertility and you want to become pregnant, your health care provider  may recommend having the fibroids removed.  HOME CARE INSTRUCTIONS  Keep all follow-up visits as directed by your health care provider. This is important.  Take medicines only as directed by your health care provider.  If you were prescribed a hormone treatment, take the hormone medicines exactly as directed.  Do not take aspirin, because it can cause bleeding.  Ask your health care provider about taking iron pills and increasing the amount of dark green, leafy vegetables in your diet. These actions can help to boost your blood iron levels, which may be affected by heavy menstrual bleeding.  Pay close attention to your period and tell your health care provider about any changes, such as:  Increased blood flow that requires you to use more pads or tampons than usual per month.  A change in the number of days that your period lasts per month.  A change in symptoms that are associated with your period, such as abdominal cramping or back pain. SEEK MEDICAL CARE IF:  You have pelvic pain, back pain, or abdominal cramps that cannot be controlled with medicines.  You have an increase in bleeding between and during periods.  You soak tampons or pads in a half hour or less.  You feel lightheaded, extra tired, or weak. SEEK IMMEDIATE MEDICAL CARE IF:  You faint.  You have a sudden increase in pelvic pain.   This information is not intended to replace advice given to you by your health care provider. Make sure you discuss any questions you have with your health care provider.   Document Released: 05/27/2000 Document Revised: 06/20/2014 Document Reviewed: 11/26/2013 Elsevier Interactive Patient Education 2016 [employer]. Levonorgestrel intrauterine device (IUD) What is this  medicine? LEVONORGESTREL IUD (LEE voe nor jes trel) is a contraceptive (birth control) device. The device is placed inside the uterus by a healthcare professional. It is used to prevent pregnancy and can also be  used to treat heavy bleeding that occurs during your period. Depending on the device, it can be used for 3 to 5 years. This medicine may be used for other purposes; ask your health care provider or pharmacist if you have questions. What should I tell my health care provider before I take this medicine? They need to know if you have any of these conditions: -abnormal Pap smear -cancer of the breast, uterus, or cervix -diabetes -endometritis -genital or pelvic infection now or in the past -have more than one sexual partner or your partner has more than one partner -heart disease -history of an ectopic or tubal pregnancy -immune system problems -IUD in place -liver disease or tumor -problems with blood clots or take blood-thinners -use intravenous drugs -uterus of unusual shape -vaginal bleeding that has not been explained -an unusual or allergic reaction to levonorgestrel, other hormones, silicone, or polyethylene, medicines, foods, dyes, or preservatives -pregnant or trying to get pregnant -breast-feeding How should I use this medicine? This device is placed inside the uterus by a health care professional. Talk to your pediatrician regarding the use of this medicine in children. Special care may be needed. Overdosage: If you think you have taken too much of this medicine contact a poison control center or emergency room at once. NOTE: This medicine is only for you. Do not share this medicine with others. What if I miss a dose? This does not apply. What may interact with this medicine? Do not take this medicine with any of the following medications: -amprenavir -bosentan -fosamprenavir This medicine may also interact with the following medications: -aprepitant -barbiturate medicines for inducing sleep or treating seizures -bexarotene -griseofulvin -medicines to treat seizures like carbamazepine, ethotoin, felbamate, oxcarbazepine, phenytoin,  topiramate -modafinil -pioglitazone -rifabutin -rifampin -rifapentine -some medicines to treat HIV infection like atazanavir, indinavir, lopinavir, nelfinavir, tipranavir, ritonavir -St. John's wort -warfarin This list may not describe all possible interactions. Give your health care provider a list of all the medicines, herbs, non-prescription drugs, or dietary supplements you use. Also tell them if you smoke, drink alcohol, or use illegal drugs. Some items may interact with your medicine. What should I watch for while using this medicine? Visit your doctor or health care professional for regular check ups. See your doctor if you or your partner has sexual contact with others, becomes HIV positive, or gets a sexual transmitted disease. This product does not protect you against HIV infection (AIDS) or other sexually transmitted diseases. You can check the placement of the IUD yourself by reaching up to the top of your vagina with clean fingers to feel the threads. Do not pull on the threads. It is a good habit to check placement after each menstrual period. Call your doctor right away if you feel more of the IUD than just the threads or if you cannot feel the threads at all. The IUD may come out by itself. You may become pregnant if the device comes out. If you notice that the IUD has come out use a backup birth control method like condoms and call your health care provider. Using tampons will not change the position of the IUD and are okay to use during your period. What side effects may I notice from receiving this medicine? Side effects that you should report  to your doctor or health care professional as soon as possible: -allergic reactions like skin rash, itching or hives, swelling of the face, lips, or tongue -fever, flu-like symptoms -genital sores -high blood pressure -no menstrual period for 6 weeks during use -pain, swelling, warmth in the leg -pelvic pain or tenderness -severe or  sudden headache -signs of pregnancy -stomach cramping -sudden shortness of breath -trouble with balance, talking, or walking -unusual vaginal bleeding, discharge -yellowing of the eyes or skin Side effects that usually do not require medical attention (report to your doctor or health care professional if they continue or are bothersome): -acne -breast pain -change in sex drive or performance -changes in weight -cramping, dizziness, or faintness while the device is being inserted -headache -irregular menstrual bleeding within first 3 to 6 months of use -nausea This list may not describe all possible side effects. Call your doctor for medical advice about side effects. You may report side effects to FDA at 1-800-FDA-1088. Where should I keep my medicine? This does not apply. NOTE: This sheet is a summary. It may not cover all possible information. If you have questions about this medicine, talk to your doctor, pharmacist, or health care provider.    2016, Elsevier/Gold Standard. (2011-06-30 13:54:04)

## 2016-03-28 NOTE — Progress Notes (Signed)
Subjective:     Brittany Johnson is a 45 y.o. female here for a routine exam. [id number]. LMP 2 weeks prev. Cycles are months. Current complaints: pt reports 6 months or heavy cycles  She has to wear a pad and a tampon at the same.  Pt reports heavy bleeding with no pain. Pt has become anemic. Pt has completed childbearing.  No FH of any female cancers   Gynecologic History Patient's last menstrual period was 03/14/2016. Contraception: condoms Last Pap: unknown  Results were: normal Last mammogram: 07/2015. Results were: normal  Obstetric History OB History  Gravida Para Term Preterm AB Living  4 2     2 2   SAB TAB Ectopic Multiple Live Births  2       2    # Outcome Date GA Lbr Len/2nd Weight Sex Delivery Anes PTL Lv  4 SAB           3 SAB           2 Para     F Vag-Spont   LIV  1 Para     M Vag-Spont   LIV     The following portions of the patient's history were reviewed and updated as appropriate: allergies, current medications, past family history, past medical history, past social history, past surgical history and problem list.  Review of Systems Pertinent items are noted in HPI.    Objective:   BP 131/82 (BP Location: Left Arm, Patient Position: Sitting)   Pulse 76   Ht 5\' 2"  (1.575 m)   Wt 211 lb (95.7 kg)   LMP 03/14/2016   BMI 38.59 kg/m  General Appearance:    Alert, cooperative, no distress, appears stated age  Head:    Normocephalic, without obvious abnormality, atraumatic  Eyes:    conjunctiva/corneas clear, EOM's intact, both eyes  Ears:    Normal external ear canals, both ears  Nose:   Nares normal, septum midline, mucosa normal, no drainage    or sinus tenderness  Throat:   Lips, mucosa, and tongue normal; teeth and gums normal  Neck:   Supple, symmetrical, trachea midline, no adenopathy;    thyroid:  no enlargement/tenderness/nodules  Back:     Symmetric, no curvature, ROM normal, no CVA tenderness  Lungs:     Clear to auscultation bilaterally,  respirations unlabored  Chest Wall:    No tenderness or deformity   Heart:    Regular rate and rhythm, S1 and S2 normal, no murmur, rub   or gallop  Breast Exam:    No tenderness, masses, or nipple abnormality  Abdomen:     Soft, non-tender, bowel sounds active all four quadrants,    no masses, no organomegaly  Genitalia:    Normal female without lesion, discharge or tenderness; uterus enlarged 14 weeks sized.  Mobile. No palpable adnexal masses     Extremities:   Extremities normal, atraumatic, no cyanosis or edema  Pulses:   2+ and symmetric all extremities  Skin:   Skin color, texture, turgor normal, no rashes or lesions     Assessment:    Healthy female exam.   AUB- fibroids Anemia due to chronic blood loss   Plan:    Follow up in: 6 weeks.    Pelvic sono Sprintec 1 po q day Pt wants to f/u for LnIUD integra 1 po q day.  May stop FeSO4  Abdallah Hern L. Harraway-Smith, M.D., Cherlynn June

## 2016-03-28 NOTE — Addendum Note (Signed)
Addended by: Phill Myron on: 03/28/2016 09:46 AM   Modules accepted: Orders

## 2016-03-29 ENCOUNTER — Ambulatory Visit (HOSPITAL_BASED_OUTPATIENT_CLINIC_OR_DEPARTMENT_OTHER)
Admission: RE | Admit: 2016-03-29 | Discharge: 2016-03-29 | Disposition: A | Discharge status: Home / Self Care | Payer: Self-pay | Source: Ambulatory Visit | Attending: Obstetrics & Gynecology | Admitting: Obstetrics & Gynecology

## 2016-03-29 ENCOUNTER — Ambulatory Visit (HOSPITAL_BASED_OUTPATIENT_CLINIC_OR_DEPARTMENT_OTHER): Payer: Self-pay

## 2016-03-29 ENCOUNTER — Other Ambulatory Visit (HOSPITAL_BASED_OUTPATIENT_CLINIC_OR_DEPARTMENT_OTHER): Payer: Self-pay

## 2016-03-29 DIAGNOSIS — D500 Iron deficiency anemia secondary to blood loss (chronic): Secondary | ICD-10-CM | POA: Insufficient documentation

## 2016-03-29 DIAGNOSIS — D259 Leiomyoma of uterus, unspecified: Secondary | ICD-10-CM | POA: Insufficient documentation

## 2016-03-29 DIAGNOSIS — N83202 Unspecified ovarian cyst, left side: Secondary | ICD-10-CM | POA: Insufficient documentation

## 2016-03-29 DIAGNOSIS — N920 Excessive and frequent menstruation with regular cycle: Secondary | ICD-10-CM | POA: Insufficient documentation

## 2016-03-30 LAB — CYTOLOGY - PAP
Diagnosis: NEGATIVE
HPV: NOT DETECTED

## 2016-04-06 ENCOUNTER — Telehealth: Payer: Self-pay

## 2016-04-06 NOTE — Telephone Encounter (Signed)
Patient calling wanting Dr. Maylene RoesTamala Julian to review her ultrasound results and call the patient.  [phone]

## 2016-04-10 ENCOUNTER — Telehealth: Payer: Self-pay | Admitting: Obstetrics & Gynecology

## 2016-04-10 NOTE — Telephone Encounter (Signed)
TC to pt to review results of the [place]. Pt has one small fibroid.  Rec that she start the OCPs as prescried on the first day of her next cycle. She will f/u in Dec or sooner if her sx get worse. If the OCps do not control the bleeding pt will consider the LnIUD.  All of her questions were answered.  Kendan Cornforth L. Harraway-Smith, M.D., Cherlynn June

## 2016-05-03 ENCOUNTER — Ambulatory Visit (INDEPENDENT_AMBULATORY_CARE_PROVIDER_SITE_OTHER): Payer: Self-pay | Admitting: Family

## 2016-05-03 ENCOUNTER — Encounter: Payer: Self-pay | Admitting: Family

## 2016-05-03 VITALS — BP 121/69 | HR 96 | Temp 98.5°F | Resp 18 | Ht 62.0 in | Wt 212.6 lb

## 2016-05-03 DIAGNOSIS — J019 Acute sinusitis, unspecified: Secondary | ICD-10-CM

## 2016-05-03 MED ORDER — AMOXICILLIN-POT CLAVULANATE 875-125 MG PO TABS: 1.0000 | ORAL_TABLET | Freq: Two times a day (BID) | ORAL | 0 refills | Status: DC

## 2016-05-03 MED ORDER — BENZONATATE 100 MG PO CAPS: 100.0000 mg | ORAL_CAPSULE | Freq: Three times a day (TID) | ORAL | 0 refills | Status: DC | PRN

## 2016-05-03 MED ORDER — ALBUTEROL SULFATE HFA 108 (90 BASE) MCG/ACT IN AERS: 2.0000 | INHALATION_SPRAY | Freq: Four times a day (QID) | RESPIRATORY_TRACT | 0 refills | Status: DC | PRN

## 2016-05-03 MED FILL — AMOX-CLAV 875-125 MG TABLET: 875-125 | 10 days supply | Qty: 20 | Fill #0

## 2016-05-03 MED FILL — VENTOLIN HFA 90 MCG INHALER: 108 (90 BAS | 30 days supply | Qty: 18 | Fill #0

## 2016-05-03 MED FILL — BENZONATATE 100 MG CAPSULE: 100 | 6 days supply | Qty: 20 | Fill #0

## 2016-05-03 NOTE — Progress Notes (Signed)
Pre visit review using our clinic review tool, if applicable. No additional management support is needed unless otherwise documented below in the visit note. 

## 2016-05-03 NOTE — Patient Instructions (Signed)
Please begin augmentin for sinus infection. You may use tessalon as needed for cough. You may use albuterol as needed for wheezing. Call if symptoms worsen or if not improved in 3 days.

## 2016-05-03 NOTE — Progress Notes (Signed)
Subjective:    Patient ID: Brittany Johnson, female    DOB: 01-19-1971, 45 y.o.   MRN: [id number]  HPI  Brittany Johnson is a 45 yr old female who presents today with chief complaint of cough. Cough has been present x nearly 2 months.  Reports + left ear pressure.  She has tried mucinex for a few days which helped briefly. Denies associated fever. + post nasal drip. Clear nasal discharge.  She tried otc prevacid for gerd which did not help her cough. She is also using claritin,  And an otc nasal steroid steroid. Symptoms are worse at night. Energy is poor.  + Wheezing.  She reports that she has pain/pressure behind her eyes.     Review of Systems  Past Medical History:  Diagnosis Date  . Abnormal cervical cytology 02/07/2011  . Bronchitis 05/25/2012  . Chicken pox as a child  . Hepatitis C carrier ([place]) 02/07/2011  . Hx: UTI (urinary tract infection) 02/07/2011  . Hypertension   . Hypokalemia 05/25/2012  . Left lateral epicondylitis 09/02/2011  . Menorrhagia 11/24/2012  . Obese 03/04/2011  . Palpitations 07/08/2012  . Psoriasis 09/02/2011  . Right shoulder strain 03/29/2012  . Shingles   . Tinea pedis of right foot 03/07/2011     Social History   Social History  . Marital status: Married    Spouse name: N/A  . Number of children: N/A  . Years of education: N/A   Occupational History  . Not on file.   Social History Main Topics  . Smoking status: Never Smoker  . Smokeless tobacco: Never Used  . Alcohol use No  . Drug use: No  . Sexual activity: Yes    Partners: Male    Birth control/ protection: None   Other Topics Concern  . Not on file   Social History Narrative   Married for 23 yrs   2 step children- grown   2001 Son   2005 daughter   Works as an Scientist, water quality at [employer]   Completed bachelor's degree   Enjoys outdoor activities, golf, reading   Restaurant manager, fast food    Past Surgical History:  Procedure Laterality Date  . DILATION AND CURETTAGE OF  UTERUS  2003   SAB    Family History  Problem Relation Age of Onset  . Diabetes Mother     type 2  . Hypertension Mother   . Hepatitis Mother     ? Hep B or Hep C  . Heart attack Father   . Hypertension Father   . Cancer Father     prostate  . Cancer Paternal Grandmother     ?    No Known Allergies  Current Outpatient Prescriptions on File Prior to Visit  Medication Sig Dispense Refill  . betamethasone dipropionate (DIPROLENE) 0.05 % cream Apply topically 2 (two) times daily. 30 g 1  . Efinaconazole 10 % SOLN Apply one drop to each affected toenail once daily (2 drops to large toenail) 8 mL 6  . Fe Fum-FePoly-FA-Vit C-Vit B3 (INTEGRA F) 125-1 MG CAPS Take 1 capsule by mouth daily. 30 capsule 1  . ferrous sulfate 325 (65 FE) MG tablet Take 325 mg by mouth 3 (three) times daily with meals.     Marland Kitchen loratadine (CLARITIN) 10 MG tablet Take 10 mg by mouth daily as needed for allergies.    . norgestimate-ethinyl estradiol (SPRINTEC 28) 0.25-35 MG-MCG tablet Take 1 tablet by mouth daily. 1 Package 11   No  current facility-administered medications on file prior to visit.     BP 121/69 (BP Location: Right Arm, Cuff Size: Normal)   Pulse 96   Temp 98.5 F (36.9 C) (Oral)   Resp 18   Ht 5\' 2"  (1.575 m)   Wt 212 lb 9.6 oz (96.4 kg)   LMP 04/21/2016   SpO2 99% Comment: room air  BMI 38.89 kg/m        Objective:   Physical Exam  Constitutional: She is oriented to person, place, and time. She appears well-developed and well-nourished.  HENT:  Head: Normocephalic and atraumatic.  Cardiovascular: Normal rate, regular rhythm and normal heart sounds.   No murmur heard. Pulmonary/Chest: Effort normal and breath sounds normal. No respiratory distress. She has no wheezes.  Musculoskeletal: She exhibits no edema.  Neurological: She is alert and oriented to person, place, and time.  Psychiatric: She has a normal mood and affect. Her behavior is normal. Judgment and thought content  normal.          Assessment & Plan:  Sinusitis- rx with augmentin.  rx with tessalon for cough.  Albuterol as needed for wheezing. Advised patient to call if symptoms worsen or if not improve in 2-3 days.

## 2016-05-16 ENCOUNTER — Ambulatory Visit (INDEPENDENT_AMBULATORY_CARE_PROVIDER_SITE_OTHER): Payer: Self-pay | Admitting: Obstetrics & Gynecology

## 2016-05-16 ENCOUNTER — Encounter: Payer: Self-pay | Admitting: Obstetrics & Gynecology

## 2016-05-16 VITALS — BP 130/61 | HR 70 | Ht 62.0 in | Wt 212.0 lb

## 2016-05-16 DIAGNOSIS — Z01812 Encounter for preprocedural laboratory examination: Secondary | ICD-10-CM

## 2016-05-16 DIAGNOSIS — Z3202 Encounter for pregnancy test, result negative: Secondary | ICD-10-CM

## 2016-05-16 DIAGNOSIS — Z30430 Encounter for insertion of intrauterine contraceptive device: Secondary | ICD-10-CM

## 2016-05-16 LAB — POCT URINE PREGNANCY: Preg Test, Ur: NEGATIVE

## 2016-05-16 NOTE — Progress Notes (Signed)
Liletta Lot: [id number]

## 2016-05-16 NOTE — Progress Notes (Addendum)
GYNECOLOGY CLINIC PROCEDURE NOTE  Brittany Johnson is a 45 y.o. [id number] here for Mirena IUD insertion. No GYN concerns.  Last pap smear was on 03/28/2016 and was normal. Pt with AUB started OCPs but, still has heavy bleeding bleeding.   03/29/2016 EXAM: TRANSABDOMINAL AND TRANSVAGINAL ULTRASOUND OF PELVIS  TECHNIQUE: Both transabdominal and transvaginal ultrasound examinations of the pelvis were performed. Transabdominal technique was performed for global imaging of the pelvis including uterus, ovaries, adnexal regions, and pelvic cul-de-sac. It was necessary to proceed with endovaginal exam following the transabdominal exam to visualize the endometrium and adnexal structures.  COMPARISON:  None  FINDINGS: Uterus  Measurements: 11.0 x 6.5 x 6.5 cm. No fibroids or other mass visualized.  Endometrium  Thickness: 2 mm. Within the posterior aspect of the uterine body there is suggestion of a 2.9 x 2.9 x 2.7 cm intramural fibroid.  Right ovary  Not well visualized.  Left ovary  Measurements: 5.7 x 2.4 x 4.6 cm. Multiple cysts and/or prominent follicles are demonstrated within the left ovary measuring up to 2.8 cm.  Other findings  No abnormal free fluid.  IMPRESSION: Fibroid uterus.  Multiple cysts within the left ovary.  IUD Insertion Procedure Note Patient identified, informed consent performed.  Discussed risks of irregular bleeding, cramping, infection, malpositioning or misplacement of the IUD outside the uterus which may require further procedures. Time out was performed.  Urine pregnancy test negative.  Speculum placed in the vagina.  Cervix visualized.  Cleaned with Betadine x 2.  Grasped anteriorly with a single tooth tenaculum.  Uterus sounded to 10 cm. Liletta IUD placed per manufacturer's recommendations.  Strings trimmed to 3 cm. Tenaculum was removed, good hemostasis noted.  Patient tolerated procedure well.   Patient was given  post-procedure instructions.  Patient was asked to follow up in 4 weeks for IUD check. Pt instructed to stop OCPs  Yandell Mcjunkins L. Harraway-Smith, M.D., Cherlynn June

## 2016-05-16 NOTE — Patient Instructions (Signed)

## 2016-06-20 ENCOUNTER — Encounter: Payer: Self-pay | Admitting: Obstetrics & Gynecology

## 2016-06-20 ENCOUNTER — Ambulatory Visit (INDEPENDENT_AMBULATORY_CARE_PROVIDER_SITE_OTHER): Payer: Self-pay | Admitting: Obstetrics & Gynecology

## 2016-06-20 VITALS — BP 152/76 | HR 82 | Ht 62.0 in | Wt 207.0 lb

## 2016-06-20 DIAGNOSIS — Z30431 Encounter for routine checking of intrauterine contraceptive device: Secondary | ICD-10-CM

## 2016-06-20 NOTE — Progress Notes (Signed)
History:  46 y.o. [id number] here today for today for IUD string check; Mirena IUD was placed  12/4/2017Pt reports daily spotting since the Mirena was placed. Her mother died 3 days prev. The memorial service is the end of this month.  The following portions of the patient's history were reviewed and updated as appropriate: allergies, current medications, past family history, past medical history, past social history, past surgical history and problem list.  Review of Systems:  Pertinent items are noted in HPI.   Objective:  Physical Exam Blood pressure (!) 152/76, pulse 82, height 5\' 2"  (1.575 m), weight 207 lb (93.9 kg), last menstrual period 05/17/2016. Gen: NAD Abd: Soft, nontender and nondistended Pelvic: Normal appearing external genitalia; normal appearing vaginal mucosa and cervix.  IUD strings visualized, about 3 cm in length outside cervix.   Assessment & Plan:  Normal IUD check. Patient to keep IUD in place for five years; can come in for removal if she desires pregnancy within the next five years. Routine preventative health maintenance measures emphasized.  Javis Abboud L. Harraway-Smith, M.D., Cherlynn June

## 2016-06-20 NOTE — Progress Notes (Signed)
Patient complaining of bleeding everyday since IUD insertion. Brittany Johnson RNBSN

## 2016-06-20 NOTE — Patient Instructions (Signed)

## 2016-06-21 ENCOUNTER — Encounter: Payer: Self-pay | Admitting: Family

## 2016-09-06 ENCOUNTER — Other Ambulatory Visit: Payer: Self-pay | Admitting: Family

## 2016-09-06 DIAGNOSIS — Z1231 Encounter for screening mammogram for malignant neoplasm of breast: Secondary | ICD-10-CM

## 2016-09-15 ENCOUNTER — Ambulatory Visit (HOSPITAL_BASED_OUTPATIENT_CLINIC_OR_DEPARTMENT_OTHER): Payer: Self-pay

## 2016-09-19 ENCOUNTER — Other Ambulatory Visit: Payer: Self-pay | Admitting: Nurse Practitioner

## 2016-09-19 DIAGNOSIS — B181 Chronic viral hepatitis B without delta-agent: Secondary | ICD-10-CM

## 2016-10-07 ENCOUNTER — Encounter: Payer: Self-pay | Admitting: Family

## 2016-10-07 ENCOUNTER — Telehealth: Payer: Self-pay | Admitting: Family

## 2016-10-07 ENCOUNTER — Other Ambulatory Visit: Payer: Self-pay

## 2016-10-07 NOTE — Telephone Encounter (Signed)
Pt lvm at 2:40 stating that she is unable to leave work so she is unable to make her appt today.    Should pt be charged?

## 2016-10-07 NOTE — Telephone Encounter (Signed)
No charge. 

## 2017-07-12 ENCOUNTER — Encounter: Payer: Self-pay | Admitting: Family

## 2017-07-12 ENCOUNTER — Ambulatory Visit (INDEPENDENT_AMBULATORY_CARE_PROVIDER_SITE_OTHER): Payer: Managed Care, Other (non HMO) | Admitting: Family

## 2017-07-12 VITALS — BP 135/86 | HR 75 | Temp 98.4°F | Resp 16 | Ht 62.0 in | Wt 211.0 lb

## 2017-07-12 DIAGNOSIS — Z23 Encounter for immunization: Secondary | ICD-10-CM

## 2017-07-12 DIAGNOSIS — Z0000 Encounter for general adult medical examination without abnormal findings: Secondary | ICD-10-CM

## 2017-07-12 LAB — CBC WITH DIFFERENTIAL/PLATELET
BASOS PCT: 0.6 % (ref 0.0–3.0)
Basophils Absolute: 0 10*3/uL (ref 0.0–0.1)
EOS PCT: 2.1 % (ref 0.0–5.0)
Eosinophils Absolute: 0.1 10*3/uL (ref 0.0–0.7)
HCT: 45.6 % (ref 36.0–46.0)
HEMOGLOBIN: 15.6 g/dL — AB (ref 12.0–15.0)
LYMPHS ABS: 1.7 10*3/uL (ref 0.7–4.0)
Lymphocytes Relative: 29.1 % (ref 12.0–46.0)
MCHC: 34.2 g/dL (ref 30.0–36.0)
MCV: 89.4 fl (ref 78.0–100.0)
MONO ABS: 0.3 10*3/uL (ref 0.1–1.0)
MONOS PCT: 5.9 % (ref 3.0–12.0)
NEUTROS PCT: 62.3 % (ref 43.0–77.0)
Neutro Abs: 3.7 10*3/uL (ref 1.4–7.7)
Platelets: 240 10*3/uL (ref 150.0–400.0)
RBC: 5.1 Mil/uL (ref 3.87–5.11)
RDW: 13.2 % (ref 11.5–15.5)
WBC: 5.9 10*3/uL (ref 4.0–10.5)

## 2017-07-12 LAB — BASIC METABOLIC PANEL
BUN: 12 mg/dL (ref 6–23)
CHLORIDE: 105 meq/L (ref 96–112)
CO2: 28 mEq/L (ref 19–32)
Calcium: 9 mg/dL (ref 8.4–10.5)
Creatinine, Ser: 0.99 mg/dL (ref 0.40–1.20)
GFR: 63.66 mL/min (ref >60.00)
GLUCOSE: 85 mg/dL (ref 70–99)
POTASSIUM: 4 meq/L (ref 3.5–5.1)
SODIUM: 140 meq/L (ref 135–145)

## 2017-07-12 LAB — HEPATIC FUNCTION PANEL
ALT: 17 U/L (ref 0–35)
AST: 16 U/L (ref 0–37)
Albumin: 4 g/dL (ref 3.5–5.2)
Alkaline Phosphatase: 42 U/L (ref 39–117)
Bilirubin, Direct: 0.2 mg/dL (ref 0.0–0.3)
Total Bilirubin: 0.9 mg/dL (ref 0.2–1.2)
Total Protein: 7.2 g/dL (ref 6.0–8.3)

## 2017-07-12 LAB — URINALYSIS, ROUTINE W REFLEX MICROSCOPIC
Bilirubin Urine: NEGATIVE
Ketones, ur: NEGATIVE
Leukocytes, UA: NEGATIVE
Nitrite: NEGATIVE
PH: 7 (ref 5.0–8.0)
SPECIFIC GRAVITY, URINE: 1.01 (ref 1.000–1.030)
Total Protein, Urine: NEGATIVE
URINE GLUCOSE: NEGATIVE
Urobilinogen, UA: 0.2 (ref 0.0–1.0)

## 2017-07-12 LAB — LIPID PANEL
CHOL/HDL RATIO: 3
Cholesterol: 127 mg/dL (ref 0–200)
HDL: 38.8 mg/dL — ABNORMAL LOW (ref >39.00)
LDL CALC: 64 mg/dL (ref 0–99)
NONHDL: 88.01
TRIGLYCERIDES: 120 mg/dL (ref 0.0–149.0)
VLDL: 24 mg/dL (ref 0.0–40.0)

## 2017-07-12 LAB — TSH: TSH: 2.09 u[IU]/mL (ref 0.35–4.50)

## 2017-07-12 MED ORDER — LEVONORGESTREL 20 MCG/24HR IU IUD: 1.0000 | INTRAUTERINE_SYSTEM | Freq: Once | INTRAUTERINE | 0 refills | Status: DC

## 2017-07-12 MED ORDER — BETAMETHASONE DIPROPIONATE 0.05 % EX CREA: TOPICAL_CREAM | Freq: Two times a day (BID) | CUTANEOUS | 1 refills | Status: DC

## 2017-07-12 NOTE — Patient Instructions (Signed)
Please complete lab work prior to leaving. Continue to work on healthy diet, exercise and weight loss.  

## 2017-07-12 NOTE — Progress Notes (Signed)
Subjective:    Patient ID: Brittany Johnson, female    DOB: [birth year], 47 y.o.   MRN: [id number]  HPI  Patient is a 47 yr old female who presents today for cpx.  Patient presents today for complete physical.  Immunizations: tdap 2012, Diet: needs improvement Wt Readings from Last 3 Encounters:  07/12/17 211 lb (95.7 kg)  06/20/16 207 lb (93.9 kg)  05/16/16 212 lb (96.2 kg)  Exercise:  rare Pap Smear: 03/28/16 Mammogram: due Vision:  Up to date Dental: up to date  Reports that for the last few weeks she cannot wear her normal wedding ring.  Has rings when she takes    Review of Systems  Constitutional: Negative for unexpected weight change.  HENT: Positive for sinus pressure. Negative for rhinorrhea.        Sinus pressure x 1 day  Respiratory: Negative for cough.   Gastrointestinal: Negative for diarrhea.       Has BM every other day  Genitourinary: Negative for dysuria and frequency.       Has IUD, still spots, will follow up with GYN  Musculoskeletal: Negative for arthralgias and myalgias.  Skin:       Has some eczema on hands.   Neurological: Negative for headaches.  Hematological: Negative for adenopathy.  Psychiatric/Behavioral:       Denies depression/anxiety   Past Medical History:  Diagnosis Date  . Abnormal cervical cytology 02/07/2011  . Bronchitis 05/25/2012  . Chicken pox as a child  . Hepatitis C carrier ([place]) 02/07/2011  . Hx: UTI (urinary tract infection) 02/07/2011  . Hypertension   . Hypokalemia 05/25/2012  . Left lateral epicondylitis 09/02/2011  . Menorrhagia 11/24/2012  . Obese 03/04/2011  . Palpitations 07/08/2012  . Psoriasis 09/02/2011  . Right shoulder strain 03/29/2012  . Shingles   . Tinea pedis of right foot 03/07/2011     Social History   Socioeconomic History  . Marital status: Married    Spouse name: Not on file  . Number of children: Not on file  . Years of education: Not on file  . Highest education level: Not on file    Social Needs  . Financial resource strain: Not on file  . Food insecurity - worry: Not on file  . Food insecurity - inability: Not on file  . Transportation needs - medical: Not on file  . Transportation needs - non-medical: Not on file  Occupational History  . Not on file  Tobacco Use  . Smoking status: Never Smoker  . Smokeless tobacco: Never Used  Substance and Sexual Activity  . Alcohol use: No  . Drug use: No  . Sexual activity: Yes    Partners: Male    Birth control/protection: None  Other Topics Concern  . Not on file  Social History Narrative   Married for 23 yrs   2 step children- grown   2001 Son   2005 daughter   Works as an Scientist, water quality at [employer]   Completed bachelor's degree   Enjoys outdoor activities, golf, reading   Restaurant manager, fast food    Past Surgical History:  Procedure Laterality Date  . DILATION AND CURETTAGE OF UTERUS  2003   SAB    Family History  Problem Relation Age of Onset  . Diabetes Mother        type 2  . Hypertension Mother   . Hepatitis Mother        ? Hep B or Hep C, died with liver  issues  . Heart attack Father   . Hypertension Father   . Cancer Father        prostate  . Cancer Paternal Grandmother        ?    No Known Allergies  Current Outpatient Medications on File Prior to Visit  Medication Sig Dispense Refill  . albuterol (PROVENTIL HFA;VENTOLIN HFA) 108 (90 Base) MCG/ACT inhaler Inhale 2 puffs into the lungs every 6 (six) hours as needed for wheezing or shortness of breath. 1 Inhaler 0  . Fe Fum-FePoly-FA-Vit C-Vit B3 (INTEGRA F) 125-1 MG CAPS Take 1 capsule by mouth daily. 30 capsule 1  . loratadine (CLARITIN) 10 MG tablet Take 10 mg by mouth daily as needed for allergies.    . norgestimate-ethinyl estradiol (SPRINTEC 28) 0.25-35 MG-MCG tablet Take 1 tablet by mouth daily. 1 Package 11   No current facility-administered medications on file prior to visit.     BP 135/86 (BP Location: Right Arm, Patient  Position: Sitting, Cuff Size: Large)   Pulse 75   Temp 98.4 F (36.9 C) (Oral)   Resp 16   Ht 5\' 2"  (1.575 m)   Wt 211 lb (95.7 kg)   SpO2 99%   BMI 38.59 kg/m        Objective:   Physical Exam  Physical Exam  Constitutional: She is oriented to person, place, and time. She appears well-developed and well-nourished. No distress.  HENT:  Head: Normocephalic and atraumatic.  Right Ear: Tympanic membrane and ear canal normal.  Left Ear: Tympanic membrane and ear canal normal.  Mouth/Throat: Oropharynx is clear and moist.  Eyes: Pupils are equal, round, and reactive to light. No scleral icterus.  Neck: Normal range of motion. No thyromegaly present.  Cardiovascular: Normal rate and regular rhythm.   No murmur heard. Pulmonary/Chest: Effort normal and breath sounds normal. No respiratory distress. He has no wheezes. She has no rales. She exhibits no tenderness.  Abdominal: Soft. Bowel sounds are normal. She exhibits no distension and no mass. There is no tenderness. There is no rebound and no guarding.  Musculoskeletal: She exhibits no edema.  Lymphadenopathy:    She has no cervical adenopathy.  Neurological: She is alert and oriented to person, place, and time. She has normal patellar reflexes. She exhibits normal muscle tone. Coordination normal.  Skin: Skin is warm and dry. eczema noted on bilateral hands/fingers Psychiatric: She has a normal mood and affect. Her behavior is normal. Judgment and thought content normal.  Breasts: Examined lying Right: Without masses, retractions, discharge or axillary adenopathy.  Left: Without masses, retractions, discharge or axillary adenopathy.  Pelvic: deferred          Assessment & Plan:       Assessment & Plan:      Assessment & Plan:   Preventative Care- discussed healthy diet, exercise, weight loss. Refer for mammo, flu shot today.   Swelling-will see how her lab work looks.  Discussed low sodium diet. No obvious edema  today.

## 2017-07-16 ENCOUNTER — Other Ambulatory Visit: Payer: Self-pay | Admitting: Family

## 2017-07-18 ENCOUNTER — Telehealth: Payer: Self-pay | Admitting: *Deleted

## 2017-07-18 DIAGNOSIS — D582 Other hemoglobinopathies: Secondary | ICD-10-CM

## 2017-07-18 NOTE — Telephone Encounter (Signed)
Notified pt. She states she has not taken iron in 8 months or more. She did see gyn and received an IUD for her heavy menstrual bleeding and was also told that she has an intrauterine fibroid. Pt states bleeding is much lighter but seems to have light spotting all month. Pt is establishing with another gyn for further opinion / workup of her fibroid. Wants to know your thoughts about this in relation to her hgb level?

## 2017-07-18 NOTE — Telephone Encounter (Signed)
-----   Message from [name], NP sent at 07/16/2017  1:42 PM EST ----- Lab work looks good except blood count is elevated. I would recommend that she stop iron supplement.

## 2017-07-19 NOTE — Telephone Encounter (Signed)
She could have been a little bit dehydrated that day.  I would recommend that she repeat CBC in 3 months along with a ferritin and serum iron level.  Diagnosis elevated hemoglobin.  Since she is not bleeding as heavy she is likely not losing as much iron any longer.

## 2017-07-19 NOTE — Telephone Encounter (Signed)
Left detailed message on pt's voicemail r: below results and to check mychart for lab appt date / time and send mychart message if any further questions. Future lab orders entered.

## 2017-07-20 ENCOUNTER — Encounter: Payer: Self-pay | Admitting: Certified Nurse Midwife

## 2017-08-22 ENCOUNTER — Encounter: Payer: Self-pay | Admitting: Certified Nurse Midwife

## 2017-09-18 ENCOUNTER — Ambulatory Visit: Payer: Managed Care, Other (non HMO)

## 2017-10-05 ENCOUNTER — Other Ambulatory Visit (HOSPITAL_COMMUNITY)
Admission: RE | Admit: 2017-10-05 | Discharge: 2017-10-05 | Disposition: A | Discharge status: Home / Self Care | Payer: Managed Care, Other (non HMO) | Source: Ambulatory Visit | Attending: Obstetrics & Gynecology | Admitting: Obstetrics & Gynecology

## 2017-10-05 ENCOUNTER — Ambulatory Visit (INDEPENDENT_AMBULATORY_CARE_PROVIDER_SITE_OTHER): Payer: 59 | Admitting: Certified Nurse Midwife

## 2017-10-05 ENCOUNTER — Other Ambulatory Visit: Payer: Self-pay

## 2017-10-05 ENCOUNTER — Encounter: Payer: Self-pay | Admitting: Certified Nurse Midwife

## 2017-10-05 VITALS — BP 118/80 | HR 68 | Resp 16 | Ht 62.25 in | Wt 214.0 lb

## 2017-10-05 DIAGNOSIS — Z124 Encounter for screening for malignant neoplasm of cervix: Secondary | ICD-10-CM

## 2017-10-05 DIAGNOSIS — N852 Hypertrophy of uterus: Secondary | ICD-10-CM

## 2017-10-05 DIAGNOSIS — Z30431 Encounter for routine checking of intrauterine contraceptive device: Secondary | ICD-10-CM

## 2017-10-05 DIAGNOSIS — Z86018 Personal history of other benign neoplasm: Secondary | ICD-10-CM | POA: Diagnosis not present

## 2017-10-05 DIAGNOSIS — Z01419 Encounter for gynecological examination (general) (routine) without abnormal findings: Secondary | ICD-10-CM

## 2017-10-05 NOTE — Progress Notes (Signed)
47 y.o. [id number] Married  Caucasian Fe here to re-establish  gyn care and for annual exam. Had episodes of heavy bleeding over the past 2-3 years, was evaluated and fibroids noted. Decided on  Mirena IUD for contraception and cycle control with insertion in 12/1 2017 with lighter periods to just spotting now. Now having routine Hepatitis B follow up with specialist and all still normal, no issues since confirmation of Hep B carrier . Sees PCP for aex/labs  [name], all stable per patient.  No other health issues today.  Patient's last menstrual period was 08/16/2017 (exact date).          Sexually active: Yes.    The current method of family planning is IUD.Mirena 12/17    Exercising: No.  exercise Smoker:  no  Health Maintenance: Pap:  03-28-16 neg HPV HR neg History of Abnormal Pap: ? MMG:  07-24-15 category c density birads 1:neg Self Breast exams: no Colonoscopy:  none BMD:   none TDaP:  2012 Shingles: no Pneumonia: no Hep C and HIV: hep c neg 2017 Labs: if needed.   reports that she has never smoked. She has never used smokeless tobacco. She reports that she does not drink alcohol or use drugs.  Past Medical History:  Diagnosis Date  . Abnormal cervical cytology 02/07/2011  . Bronchitis 05/25/2012  . Chicken pox as a child  . Fibroid   . Hepatitis B   . Hx: UTI (urinary tract infection) 02/07/2011  . Hypertension    in the past  . Hypokalemia 05/25/2012  . Left lateral epicondylitis 09/02/2011  . Menorrhagia 11/24/2012  . Obese 03/04/2011  . Palpitations 07/08/2012   in the past  . Psoriasis 09/02/2011  . Right shoulder strain 03/29/2012  . Shingles   . Tinea pedis of right foot 03/07/2011    Past Surgical History:  Procedure Laterality Date  . DILATION AND CURETTAGE OF UTERUS  2003   SAB  . INTRAUTERINE DEVICE (IUD) INSERTION     mirena inserted 05-16-16    Current Outpatient Medications  Medication Sig Dispense Refill  . betamethasone dipropionate  (DIPROLENE) 0.05 % cream Apply topically 2 (two) times daily. 30 g 1  . levonorgestrel (MIRENA, 52 MG,) 20 MCG/24HR IUD 1 each by Intrauterine route once.    . loratadine (CLARITIN) 10 MG tablet Take 10 mg by mouth daily as needed for allergies.     No current facility-administered medications for this visit.     Family History  Problem Relation Age of Onset  . Diabetes Mother        type 2  . Hypertension Mother   . Hepatitis Mother        Hep B, died with liver issues  . Heart attack Father   . Hypertension Father   . Cancer Father        prostate  . Cancer Paternal Grandmother        ?    ROS:  Pertinent items are noted in HPI.  Otherwise, a comprehensive ROS was negative.  Exam:   BP 118/80   Pulse 68   Resp 16   Ht 5' 2.25" (1.581 m)   Wt 214 lb (97.1 kg)   LMP 08/16/2017 (Exact Date)   BMI 38.83 kg/m  Height: 5' 2.25" (158.1 cm) Ht Readings from Last 3 Encounters:  10/05/17 5' 2.25" (1.581 m)  07/12/17 5\' 2"  (1.575 m)  06/20/16 5\' 2"  (1.575 m)    General appearance: alert, cooperative and appears  stated age Head: Normocephalic, without obvious abnormality, atraumatic Neck: no adenopathy, supple, symmetrical, trachea midline and thyroid normal to inspection and palpation Lungs: clear to auscultation bilaterally Breasts: normal appearance, no masses or tenderness, No nipple retraction or dimpling, No nipple discharge or bleeding, No axillary or supraclavicular adenopathy Heart: regular rate and rhythm Abdomen: soft, non-tender; no masses,  no organomegaly Extremities: extremities normal, atraumatic, no cyanosis or edema Skin: Skin color, texture, turgor normal. No rashes or lesions Lymph nodes: Cervical, supraclavicular, and axillary nodes normal. No abnormal inguinal nodes palpated Neurologic: Grossly normal   Pelvic: External genitalia:  no lesions              Urethra:  normal appearing urethra with no masses, tenderness or lesions               Bartholin's and Skene's: normal                 Vagina: normal appearing vagina with normal color and discharge, no lesions              Cervix: multiparous appearance, no cervical motion tenderness, no lesions and IUD string noted in cervix              Pap taken: Yes.   Bimanual Exam:  Uterus:  enlarged, 11-12  weeks size, no masses noted, not nodular ( per last PUS  In epic no change in size, small fibroid suspected              Adnexa: normal adnexa and no mass, fullness, tenderness               Rectovaginal: Confirms               Anus:  normal sphincter tone, no lesions  Chaperone present: yes  A:  Well Woman with normal exam  Contraception Liletta IUD inserted 05/16/16 due for removal 05/16/2021.  History of enlarged uterus per palpation and US,no change in size, history of small fibroid  Hepatitis B carrier (from mother) under follow up with Hepatitis specialist, no concerns at this time  History of Hypertension diet controlled and monitored by PCP  Obesity,currently working on weight management  P:   Reviewed health and wellness pertinent to exam  Warning signs and bleeding expectations with IUD given.  Discussed no change in uterine size per exam and comparison with PUS  Continue follow up with MD's as indicated with other medical problems  Encouraged to move forward with weight loss journey for better long term health  Pap smear: yes   counseled on breast self exam, mammography screening stressed overdue, patient to schedule, feminine hygiene, adequate intake of calcium and vitamin D, diet and exercise  return annually or prn  An After Visit Summary was printed and given to the patient.

## 2017-10-05 NOTE — Patient Instructions (Signed)

## 2017-10-06 ENCOUNTER — Encounter: Payer: Self-pay | Admitting: Certified Nurse Midwife

## 2017-10-06 DIAGNOSIS — L409 Psoriasis, unspecified: Secondary | ICD-10-CM | POA: Insufficient documentation

## 2017-10-06 DIAGNOSIS — Z124 Encounter for screening for malignant neoplasm of cervix: Secondary | ICD-10-CM | POA: Diagnosis not present

## 2017-10-09 LAB — CYTOLOGY - PAP: Diagnosis: NEGATIVE

## 2017-10-16 ENCOUNTER — Other Ambulatory Visit: Payer: Managed Care, Other (non HMO)

## 2017-10-18 DIAGNOSIS — K740 Hepatic fibrosis: Secondary | ICD-10-CM | POA: Diagnosis not present

## 2017-11-08 ENCOUNTER — Encounter: Payer: Self-pay | Admitting: *Deleted

## 2017-11-08 ENCOUNTER — Telehealth: Payer: Self-pay | Admitting: *Deleted

## 2017-11-08 NOTE — Telephone Encounter (Signed)
Pt also did not read mychart message regarding lab results from February. Letter mailed to pt addressing need to complete both.

## 2017-11-08 NOTE — Telephone Encounter (Signed)
-----   Message from Katha Hamming sent at 08/30/2017 12:37 PM EDT ----- Regarding: mammogram We have left three messages for maddy to call back to schedule her mammogram since January 2019.  Thanks, Hoyle Sauer

## 2017-11-28 ENCOUNTER — Other Ambulatory Visit: Payer: Self-pay | Admitting: Nurse Practitioner

## 2017-11-28 DIAGNOSIS — B181 Chronic viral hepatitis B without delta-agent: Secondary | ICD-10-CM

## 2017-11-30 ENCOUNTER — Encounter: Payer: Self-pay | Admitting: Family

## 2017-11-30 ENCOUNTER — Ambulatory Visit (INDEPENDENT_AMBULATORY_CARE_PROVIDER_SITE_OTHER): Payer: Managed Care, Other (non HMO) | Admitting: Family

## 2017-11-30 VITALS — BP 140/88 | HR 74 | Temp 98.4°F | Resp 16 | Ht 62.25 in | Wt 215.0 lb

## 2017-11-30 DIAGNOSIS — J209 Acute bronchitis, unspecified: Secondary | ICD-10-CM

## 2017-11-30 DIAGNOSIS — K219 Gastro-esophageal reflux disease without esophagitis: Secondary | ICD-10-CM | POA: Diagnosis not present

## 2017-11-30 MED ORDER — AZITHROMYCIN 250 MG PO TABS: ORAL_TABLET | ORAL | 0 refills | Status: DC

## 2017-11-30 MED ORDER — OMEPRAZOLE 40 MG PO CPDR: 40.0000 mg | DELAYED_RELEASE_CAPSULE | Freq: Every day | ORAL | 3 refills | Status: DC

## 2017-11-30 MED ORDER — PREDNISONE 10 MG PO TABS: ORAL_TABLET | ORAL | 0 refills | Status: DC

## 2017-11-30 MED ORDER — ALBUTEROL SULFATE HFA 108 (90 BASE) MCG/ACT IN AERS: 2.0000 | INHALATION_SPRAY | Freq: Four times a day (QID) | RESPIRATORY_TRACT | 0 refills | Status: DC | PRN

## 2017-11-30 MED FILL — AZITHROMYCIN 250 MG TABLET: 250 | 5 days supply | Qty: 6 | Fill #0

## 2017-11-30 NOTE — Progress Notes (Signed)
Subjective:    Patient ID: Brittany Johnson, female    DOB: 1970-12-18, 47 y.o.   MRN: [id number]  HPI  Patient is a 47 year old female who presents today with chief complaint of cough.  Cough is been present for 2 weeks. Reports that she is taking claritin. Cough is generally dry. Denies recent fever. Thinks she might have had fever one day last week. No other symptoms, just tired. She does report "some pretty bad reflux symptoms" for which she has been using gaviscon.    Review of Systems    see HPI  Past Medical History:  Diagnosis Date  . Abnormal cervical cytology 02/07/2011   ?  Marland Kitchen Bronchitis 05/25/2012  . Chicken pox as a child  . Fibroid   . Hepatitis B   . Hx: UTI (urinary tract infection) 02/07/2011  . Hypertension    in the past  . Hypokalemia 05/25/2012  . Left lateral epicondylitis 09/02/2011  . Menorrhagia 11/24/2012  . Obese 03/04/2011  . Palpitations 07/08/2012   in the past  . Psoriasis 09/02/2011  . Right shoulder strain 03/29/2012  . Shingles   . Tinea pedis of right foot 03/07/2011     Social History   Socioeconomic History  . Marital status: Married    Spouse name: Not on file  . Number of children: Not on file  . Years of education: Not on file  . Highest education level: Not on file  Occupational History  . Not on file  Social Needs  . Financial resource strain: Not on file  . Food insecurity:    Worry: Not on file    Inability: Not on file  . Transportation needs:    Medical: Not on file    Non-medical: Not on file  Tobacco Use  . Smoking status: Never Smoker  . Smokeless tobacco: Never Used  Substance and Sexual Activity  . Alcohol use: No  . Drug use: No  . Sexual activity: Yes    Partners: Male    Birth control/protection: IUD  Lifestyle  . Physical activity:    Days per week: Not on file    Minutes per session: Not on file  . Stress: Not on file  Relationships  . Social connections:    Talks on phone: Not on file    Gets  together: Not on file    Attends religious service: Not on file    Active member of club or organization: Not on file    Attends meetings of clubs or organizations: Not on file    Relationship status: Not on file  . Intimate partner violence:    Fear of current or ex partner: Not on file    Emotionally abused: Not on file    Physically abused: Not on file    Forced sexual activity: Not on file  Other Topics Concern  . Not on file  Social History Narrative   Married for 23 yrs   2 step children- grown   2001 Son   2005 daughter   Works as an Scientist, water quality at [employer]    Completed bachelor's degree   Enjoys outdoor activities, golf, reading   Restaurant manager, fast food    Past Surgical History:  Procedure Laterality Date  . DILATION AND CURETTAGE OF UTERUS  2003   SAB  . INTRAUTERINE DEVICE (IUD) INSERTION     mirena inserted 05-16-16    Family History  Problem Relation Age of Onset  . Diabetes Mother  type 2  . Hypertension Mother   . Hepatitis Mother        Hep B, died with liver issues  . Heart attack Father   . Hypertension Father   . Cancer Father        prostate  . Cancer Paternal Grandmother        ?    No Known Allergies  Current Outpatient Medications on File Prior to Visit  Medication Sig Dispense Refill  . betamethasone dipropionate (DIPROLENE) 0.05 % cream Apply topically 2 (two) times daily. 30 g 1  . levonorgestrel (MIRENA, 52 MG,) 20 MCG/24HR IUD 1 each by Intrauterine route once.    . loratadine (CLARITIN) 10 MG tablet Take 10 mg by mouth daily as needed for allergies.     No current facility-administered medications on file prior to visit.     BP 140/88 (BP Location: Right Arm, Patient Position: Sitting, Cuff Size: Large)   Pulse 74   Temp 98.4 F (36.9 C) (Oral)   Resp 16   Ht 5' 2.25" (1.581 m)   Wt 215 lb (97.5 kg)   LMP 11/29/2017   SpO2 98%   BMI 39.01 kg/m    Objective:   Physical Exam  Constitutional: She is oriented  to person, place, and time. She appears well-developed and well-nourished.  HENT:  Right Ear: Tympanic membrane and ear canal normal.  Left Ear: Tympanic membrane and ear canal normal.  Mouth/Throat: Posterior oropharyngeal erythema present. No posterior oropharyngeal edema.  Eyes: Right eye exhibits no discharge. Left eye exhibits no discharge.  Cardiovascular: Normal rate, regular rhythm and normal heart sounds.  No murmur heard. Pulmonary/Chest: Effort normal. No respiratory distress. She has wheezes in the left upper field and the left middle field.  Neurological: She is alert and oriented to person, place, and time.  Skin: Skin is warm and dry.  Psychiatric: She has a normal mood and affect. Her behavior is normal. Judgment and thought content normal.          Assessment & Plan:   Bronchitis with bronchospasm- advised pt as follows:  Start azithromycin (antibiotic), prednisone taper and albuterol 2 puffs every 6 hours as needed. You may also use otc Delsym as needed for cough.  Call if new/worsening symptoms or if symptoms are not improved in 3-4 days.   GERD- trial of omeprazole.      Assessment & Plan:

## 2017-11-30 NOTE — Patient Instructions (Signed)
Start azithromycin (antibiotic), prednisone taper and albuterol 2 puffs every 6 hours as needed. You may also use otc Delsym as needed for cough.  Add Omeprazole 40mg  once daily for reflux. Call if new/worsening symptoms or if symptoms are not improved in 3-4 days.

## 2017-12-04 ENCOUNTER — Ambulatory Visit: Payer: Self-pay | Admitting: Family

## 2017-12-04 DIAGNOSIS — R059 Cough, unspecified: Secondary | ICD-10-CM

## 2017-12-04 DIAGNOSIS — R05 Cough: Secondary | ICD-10-CM

## 2017-12-04 MED ORDER — BENZONATATE 100 MG PO CAPS: 100.0000 mg | ORAL_CAPSULE | Freq: Three times a day (TID) | ORAL | 0 refills | Status: DC | PRN

## 2017-12-04 NOTE — Addendum Note (Signed)
Addended by: [name] on: 12/04/2017 06:24 PM   Modules accepted: Orders

## 2017-12-04 NOTE — Telephone Encounter (Signed)
Left detailed message on pt's voicemail regarding below results and to call if any questions. Xray is available without an appointment between 7am and 5pm.

## 2017-12-04 NOTE — Telephone Encounter (Signed)
Let's have her complete a chest x-ray. I have also placed a referral to pulmonology and sent an rx for tessalon.  We will let her know what the chest x ray shows. Continue albuterol 2 puffs every 6 hours.

## 2017-12-04 NOTE — Telephone Encounter (Signed)
Non-productive cough - feels dry sometimes, "but then sometimes I feel like I need to cough something up." Finished antibiotic today. Still wheezing some.Can't sleep because of coughing. States " My doctor wanted me to call today if I still felt bad." Please advise pt.  Answer Assessment - Initial Assessment Questions 1. ONSET: "When did the cough begin?"      Started 2 weeks ago 2. SEVERITY: "How bad is the cough today?"      No better 3. RESPIRATORY DISTRESS: "Describe your breathing."      No distress 4. FEVER: "Do you have a fever?" If so, ask: "What is your temperature, how was it measured, and when did it start?"     No 5. HEMOPTYSIS: "Are you coughing up any blood?" If so ask: "How much?" (flecks, streaks, tablespoons, etc.)     No 6. TREATMENT: "What have you done so far to treat the cough?" (e.g., meds, fluids, humidifier)     Finished 7. CARDIAC HISTORY: "Do you have any history of heart disease?" (e.g., heart attack, congestive heart failure)      No 8. LUNG HISTORY: "Do you have any history of lung disease?"  (e.g., pulmonary embolus, asthma, emphysema)     No 9. PE RISK FACTORS: "Do you have a history of blood clots?" (or: recent major surgery, recent prolonged travel, bedridden )     No 10. OTHER SYMPTOMS: "Do you have any other symptoms? (e.g., runny nose, wheezing, chest pain)       Some wheezing 11. PREGNANCY: "Is there any chance you are pregnant?" "When was your last menstrual period?"       No 12. TRAVEL: "Have you traveled out of the country in the last month?" (e.g., travel history, exposures)       No  Protocols used: COUGH - ACUTE NON-PRODUCTIVE-A-AH

## 2017-12-05 ENCOUNTER — Encounter: Payer: Self-pay | Admitting: Family

## 2017-12-05 ENCOUNTER — Ambulatory Visit (HOSPITAL_BASED_OUTPATIENT_CLINIC_OR_DEPARTMENT_OTHER)
Admission: RE | Admit: 2017-12-05 | Discharge: 2017-12-05 | Disposition: A | Discharge status: Home / Self Care | Payer: 59 | Source: Ambulatory Visit | Attending: Family | Admitting: Family

## 2017-12-05 DIAGNOSIS — Z0000 Encounter for general adult medical examination without abnormal findings: Secondary | ICD-10-CM | POA: Diagnosis not present

## 2017-12-05 DIAGNOSIS — R059 Cough, unspecified: Secondary | ICD-10-CM

## 2017-12-05 DIAGNOSIS — R05 Cough: Secondary | ICD-10-CM | POA: Diagnosis not present

## 2017-12-06 MED ORDER — NITROFURANTOIN MONOHYD MACRO 100 MG PO CAPS: 100.0000 mg | ORAL_CAPSULE | Freq: Two times a day (BID) | ORAL | 0 refills | Status: DC

## 2017-12-15 ENCOUNTER — Ambulatory Visit
Admission: RE | Admit: 2017-12-15 | Discharge: 2017-12-15 | Disposition: A | Discharge status: Home / Self Care | Payer: Managed Care, Other (non HMO) | Source: Ambulatory Visit | Attending: Nurse Practitioner | Admitting: Nurse Practitioner

## 2017-12-15 DIAGNOSIS — B181 Chronic viral hepatitis B without delta-agent: Secondary | ICD-10-CM

## 2017-12-15 DIAGNOSIS — K760 Fatty (change of) liver, not elsewhere classified: Secondary | ICD-10-CM | POA: Diagnosis not present

## 2017-12-17 DIAGNOSIS — N390 Urinary tract infection, site not specified: Secondary | ICD-10-CM | POA: Diagnosis not present

## 2017-12-19 ENCOUNTER — Other Ambulatory Visit (INDEPENDENT_AMBULATORY_CARE_PROVIDER_SITE_OTHER): Payer: Managed Care, Other (non HMO)

## 2017-12-19 ENCOUNTER — Encounter: Payer: Self-pay | Admitting: Internal Medicine

## 2017-12-19 ENCOUNTER — Ambulatory Visit (INDEPENDENT_AMBULATORY_CARE_PROVIDER_SITE_OTHER): Payer: Managed Care, Other (non HMO) | Admitting: Internal Medicine

## 2017-12-19 VITALS — BP 142/92 | HR 94 | Ht 62.0 in | Wt 221.0 lb

## 2017-12-19 DIAGNOSIS — R05 Cough: Secondary | ICD-10-CM

## 2017-12-19 DIAGNOSIS — J45991 Cough variant asthma: Secondary | ICD-10-CM

## 2017-12-19 DIAGNOSIS — R058 Other specified cough: Secondary | ICD-10-CM

## 2017-12-19 LAB — CBC WITH DIFFERENTIAL/PLATELET
BASOS ABS: 0.1 10*3/uL (ref 0.0–0.1)
Basophils Relative: 0.7 % (ref 0.0–3.0)
Eosinophils Absolute: 0.2 10*3/uL (ref 0.0–0.7)
Eosinophils Relative: 1.8 % (ref 0.0–5.0)
HEMATOCRIT: 44.9 % (ref 36.0–46.0)
HEMOGLOBIN: 15.3 g/dL — AB (ref 12.0–15.0)
LYMPHS PCT: 22.9 % (ref 12.0–46.0)
Lymphs Abs: 2.3 10*3/uL (ref 0.7–4.0)
MCHC: 34.1 g/dL (ref 30.0–36.0)
MCV: 90.4 fl (ref 78.0–100.0)
MONO ABS: 0.6 10*3/uL (ref 0.1–1.0)
Monocytes Relative: 6.1 % (ref 3.0–12.0)
Neutro Abs: 6.9 10*3/uL (ref 1.4–7.7)
Neutrophils Relative %: 68.5 % (ref 43.0–77.0)
Platelets: 201 10*3/uL (ref 150.0–400.0)
RBC: 4.96 Mil/uL (ref 3.87–5.11)
RDW: 13.3 % (ref 11.5–15.5)
WBC: 10 10*3/uL (ref 4.0–10.5)

## 2017-12-19 MED ORDER — TRAMADOL HCL 50 MG PO TABS: 50.0000 mg | ORAL_TABLET | ORAL | 0 refills | Status: AC | PRN

## 2017-12-19 MED ORDER — BUDESONIDE-FORMOTEROL FUMARATE 80-4.5 MCG/ACT IN AERO: 2.0000 | INHALATION_SPRAY | Freq: Two times a day (BID) | RESPIRATORY_TRACT | 11 refills | Status: DC

## 2017-12-19 MED ORDER — OMEPRAZOLE 40 MG PO CPDR
DELAYED_RELEASE_CAPSULE | ORAL | 3 refills | Status: DC
Start: 2017-12-19 — End: 2018-09-04

## 2017-12-19 NOTE — Patient Instructions (Addendum)
Change omeprazole to 40 mg Take 30- 60 min before your first and last meals of the day   Change clariton to zytrec 10 mg at bedtime  Take delsym two tsp every 12 hours and supplement if needed with  tramadol 50 mg up to 1-2 every 4 hours to suppress the urge to cough. Swallowing water and/or using ice chips/non mint and menthol containing candies (such as lifesavers or sugarless jolly ranchers) are also effective.  You should rest your voice and avoid activities that you know make you cough.  Once you have eliminated the cough for 3 straight days try reducing the tramadol first,  then the delsym as tolerated.     Start symbicort 80 Take 2 puffs first thing in am and then another 2 puffs about 12 hours later.      Work on inhaler technique:  relax and gently blow all the way out then take a nice smooth deep breath back in, triggering the inhaler at same time you start breathing in.  Hold for up to 5 seconds if you can. Blow out thru nose. Rinse and gargle with water when done  Please remember to go to the lab department downstairs in the basement  for your tests - we will call you with the results when they are available.     Please schedule a follow up office visit in 3 weeks, sooner if needed  with all medications /inhalers/ solutions in hand so we can verify exactly what you are taking. This includes all medications from all doctors and over the counters

## 2017-12-19 NOTE — Assessment & Plan Note (Addendum)
12/19/2017  After extensive coaching inhaler device  effectiveness =   Symbicort 80 2bid   Noct cough in setting of seasonal rhinitis is strongly suggestive of cough variant asthma so reasonable to try low dose ICS here acknowledging the ICS may trigger UACS (see sep a/p)  Total time devoted to counseling  > 50 % of initial 60 min office visit:  review case with pt/ discussion of options/alternatives/ personally creating written customized instructions  in presence of pt  then going over those specific  Instructions directly with the pt including how to use all of the meds but in particular covering each new medication in detail and the difference between the maintenance= "automatic" meds and the prns using an action plan format for the latter (If this problem/symptom => do that organization reading Left to right).  Please see AVS from this visit for a full list of these instructions which I personally wrote for this pt and  are unique to this visit.   See device teaching which extended face to face time for this visit

## 2017-12-19 NOTE — Progress Notes (Signed)
Brittany Johnson, female    DOB: 04-06-1971,    MRN: [id number]   Brief patient profile:  42 yowf from [place] / mixed race mother french/vietnamese was  father was [place] and she was healthy child / two term pregnancies by age 47  But by age 32 noted pattern of recurrent pattern while in [place] of "allergies" spring and fall of runny nose and cough turning into "bronchitis" freq requiring abx /sometimes prednisone and saba but no maint rx and pattern continued when moved to [place] age 34 which was around 2009 ? Improved transiently then much worse x 2017 requiring same rx including albuterol /short course prednisoneand in between fine but then around mid to late May 2019 similar onset rhinitis then cough ever since so eval by Dr Edwena Blow rx with same rx plus prilosec and some better but much worse with speaking so referred to pulmonary clinic 12/19/2017 by Dr   Inda Castle.  Works for [employer].      12/19/2017  f/u ov/Brittany Johnson re:  Chief Complaint  Patient presents with  . Pulmonary Consult    Referred by Dr. Earlie Counts. Pt c/o cough x 6 wks- occ prod with clear sputum. She states tends to get worse when she lies down and wakes her up.  She has an albuterol inhaler that she uses 2 x daily on average.   Dyspnea:  Only if coughing Cough: shrill /dry worse p several hours supine q noct but also with voice use  Sleep: on 2 pillows  SABA use: several times a day, seems to help - tessalon no help   On macobid for a week changed to bactrim 12/17/17 - no prior macrobid exp   No obvious day to day or daytime variability or assoc excess/ purulent sputum or mucus plugs or hemoptysis or cp or chest tightness, subjective wheeze or overt sinus or hb symptoms.     Also denies any obvious fluctuation of symptoms with weather or environmental changes or other aggravating or alleviating factors except as outlined above   No unusual exposure hx or h/o childhood pna/ asthma or knowledge of  premature birth.  Current Allergies, Complete Past Medical History, Past Surgical History, Family History, and Social History were reviewed in [facility] record.  ROS  The following are not active complaints unless bolded Hoarseness, sore throat, dysphagia, dental problems, itching, sneezing,  nasal congestion or discharge of excess mucus or purulent secretions, ear ache,   fever, chills, sweats, unintended wt loss or wt gain, classically pleuritic or exertional cp,  orthopnea pnd or arm/hand swelling  or leg swelling, presyncope, palpitations, abdominal pain, anorexia, nausea, vomiting, diarrhea  or change in bowel habits or change in bladder habits, change in stools or change in urine, dysuria, hematuria,  rash, arthralgias, visual complaints, headache, numbness, weakness or ataxia or problems with walking or coordination,  change in mood or  memory.               Past Medical History:  Diagnosis Date  . Abnormal cervical cytology 02/07/2011   ?  Marland Kitchen Bronchitis 05/25/2012  . Chicken pox as a child  . Fibroid   . Hepatitis B   . Hx: UTI (urinary tract infection) 02/07/2011  . Hypertension    in the past  . Hypokalemia 05/25/2012  . Left lateral epicondylitis 09/02/2011  . Menorrhagia 11/24/2012  . Obese 03/04/2011  . Palpitations 07/08/2012   in the past  . Psoriasis 09/02/2011  .  Right shoulder strain 03/29/2012  . Shingles   . Tinea pedis of right foot 03/07/2011    Outpatient Medications Prior to Visit  Medication Sig Dispense Refill  . albuterol (PROVENTIL HFA;VENTOLIN HFA) 108 (90 Base) MCG/ACT inhaler Inhale 2 puffs into the lungs every 6 (six) hours as needed for wheezing or shortness of breath. 1 Inhaler 0  . betamethasone dipropionate (DIPROLENE) 0.05 % cream Apply topically 2 (two) times daily. 30 g 1  . levonorgestrel (MIRENA, 52 MG,) 20 MCG/24HR IUD 1 each by Intrauterine route once.    . loratadine (CLARITIN) 10 MG tablet Take 10 mg by mouth  daily as needed for allergies.    . nitrofurantoin, macrocrystal-monohydrate, (MACROBID) 100 MG capsule Take 1 capsule (100 mg total) by mouth 2 (two) times daily. 10 capsule 0  . omeprazole (PRILOSEC) 40 MG capsule Take 1 capsule (40 mg total) by mouth daily. 30 capsule 3             Objective:     BP (!) 142/92 (BP Location: Left Arm, Cuff Size: Normal)   Pulse 94   Ht 5\' 2"  (1.575 m)   Wt 221 lb (100.2 kg)   LMP 11/29/2017   SpO2 100%   BMI 40.42 kg/m   SpO2: 100 % RA   amb obese female harsh upper airway cough triggered at end exp   HEENT: nl dentition,  and oropharynx. Nl external ear canals without cough reflex- mild bilateral non-specific turbinate edema with mucoid secretions     NECK :  without JVD/Nodes/TM/ nl carotid upstrokes bilaterally   LUNGS: no acc muscle use,  Nl contour chest which is clear to A and P bilaterally without cough on insp or exp maneuvers   CV:  RRR  no s3 or murmur or increase in P2, and no edema   ABD:  soft and nontender with nl inspiratory excursion in the supine position. No bruits or organomegaly appreciated, bowel sounds nl  MS:  Nl gait/ ext warm without deformities, calf tenderness, cyanosis or clubbing No obvious joint restrictions   SKIN: warm and dry without lesions    NEURO:  alert, approp, nl sensorium with  no motor or cerebellar deficits apparent.      I personally reviewed images and agree with radiology impression as follows:  CXR:   12/05/17    No acute cardiopulmonary disease.   Labs ordered 12/19/2017  Allergy profile      Assessment   Cough variant asthma vs UACS  12/19/2017  After extensive coaching inhaler device  effectiveness =   Symbicort 80 2bid   Noct cough in setting of seasonal rhinitis is strongly suggestive of cough variant asthma so reasonable to try low dose ICS here acknowledging the ICS may trigger UACS (see sep a/p)  Total time devoted to counseling  > 50 % of initial 60 min office visit:   review case with pt/ discussion of options/alternatives/ personally creating written customized instructions  in presence of pt  then going over those specific  Instructions directly with the pt including how to use all of the meds but in particular covering each new medication in detail and the difference between the maintenance= "automatic" meds and the prns using an action plan format for the latter (If this problem/symptom => do that organization reading Left to right).  Please see AVS from this visit for a full list of these instructions which I personally wrote for this pt and  are unique to this visit.  See device teaching which extended face to face time for this visit   Upper airway cough syndrome Allergy profile 12/19/2017 >  Eos 0.2 /  IgE   - see cough variant asthma 12/14/1285  - cyclical cough rx 01/17/7671 >>>  Upper airway cough syndrome (previously labeled PNDS),  is so named because it's frequently impossible to sort out how much is  CR/sinusitis with freq throat clearing (which can be related to primary GERD)   vs  causing  secondary (" extra esophageal")  GERD from wide swings in gastric pressure that occur with throat clearing, often  promoting self use of mint and menthol lozenges that reduce the lower esophageal sphincter tone and exacerbate the problem further in a cyclical fashion.   These are the same pts (now being labeled as having "irritable larynx syndrome" by some cough centers) who not infrequently have a history of having failed to tolerate ace inhibitors,  dry powder inhalers(and sometime hfa ics even in low doses)  or biphosphonates or report having atypical/extraesophageal reflux symptoms that don't respond to standard doses of PPI  and are easily confused as having aecopd or asthma flares by even experienced allergists/ pulmonologists (myself included).   Of the three most common causes of  Sub-acute / recurrent or chronic cough, only one (GERD)  can actually contribute  to/ trigger  the other two (asthma and post nasal drip syndrome)  and perpetuate the cylce of cough.  While not intuitively obvious, many patients with chronic low grade reflux do not cough until there is a primary insult that disturbs the protective epithelial barrier and exposes sensitive nerve endings.   This is typically viral but can due to PNDS and  either may apply here.      The point is that once this occurs, it is difficult to eliminate the cycle  using anything but a maximally effective acid suppression regimen at least in the short run, accompanied by an appropriate diet to address non acid GERD and control / eliminate the cough itself for at least 3 days with tramadol.   Will also try zyrtec and if not effective try 1st gen H1 blockers per guidelines       Christinia Gully, MD 12/19/2017

## 2017-12-19 NOTE — Assessment & Plan Note (Signed)
Allergy profile 12/19/2017 >  Eos 0.2 /  IgE   - see cough variant asthma 11/13/2631  - cyclical cough rx 08/15/4560 >>>  Upper airway cough syndrome (previously labeled PNDS),  is so named because it's frequently impossible to sort out how much is  CR/sinusitis with freq throat clearing (which can be related to primary GERD)   vs  causing  secondary (" extra esophageal")  GERD from wide swings in gastric pressure that occur with throat clearing, often  promoting self use of mint and menthol lozenges that reduce the lower esophageal sphincter tone and exacerbate the problem further in a cyclical fashion.   These are the same pts (now being labeled as having "irritable larynx syndrome" by some cough centers) who not infrequently have a history of having failed to tolerate ace inhibitors,  dry powder inhalers(and sometime hfa ics even in low doses)  or biphosphonates or report having atypical/extraesophageal reflux symptoms that don't respond to standard doses of PPI  and are easily confused as having aecopd or asthma flares by even experienced allergists/ pulmonologists (myself included).   Of the three most common causes of  Sub-acute / recurrent or chronic cough, only one (GERD)  can actually contribute to/ trigger  the other two (asthma and post nasal drip syndrome)  and perpetuate the cylce of cough.  While not intuitively obvious, many patients with chronic low grade reflux do not cough until there is a primary insult that disturbs the protective epithelial barrier and exposes sensitive nerve endings.   This is typically viral but can due to PNDS and  either may apply here.      The point is that once this occurs, it is difficult to eliminate the cycle  using anything but a maximally effective acid suppression regimen at least in the short run, accompanied by an appropriate diet to address non acid GERD and control / eliminate the cough itself for at least 3 days with tramadol.   Will also try zyrtec  and if not effective try 1st gen H1 blockers per guidelines

## 2017-12-20 LAB — RESPIRATORY ALLERGY PROFILE REGION II ~~LOC~~
Allergen, A. alternata, m6: 0.88 kU/L — ABNORMAL HIGH
Allergen, Cedar tree, t12: 0.86 kU/L — ABNORMAL HIGH
Allergen, Comm Silver Birch, t9: 0.12 kU/L — ABNORMAL HIGH
Allergen, D pternoyssinus,d7: 0.28 kU/L — ABNORMAL HIGH
Allergen, Mouse Urine Protein, e78: <0.1 kU/L
Allergen, Mulberry, t76: <0.1 kU/L
Allergen, Oak,t7: 0.18 kU/L — ABNORMAL HIGH
Allergen, P. notatum, m1: <0.1 kU/L
Aspergillus fumigatus, m3: <0.1 kU/L
CLADOSPORIUM HERBARUM (M2) IGE: 0.1 kU/L — AB
CLASS: 0
CLASS: 0
CLASS: 0
CLASS: 0
CLASS: 0
CLASS: 0
CLASS: 0
CLASS: 2
COMMON RAGWEED (SHORT) (W1) IGE: <0.1 kU/L
Cat Dander: <0.1 kU/L
Class: 0
Class: 0
Class: 0
Class: 0
Class: 0
Class: 0
Class: 0
Class: 0
Class: 0
Class: 0
Class: 0
Class: 0
Class: 0
Class: 0
Class: 0
Class: 2
Cockroach: <0.1 kU/L
D. farinae: 0.23 kU/L — ABNORMAL HIGH
IGE (IMMUNOGLOBULIN E), SERUM: 41 kU/L (ref <=114)
Pecan/Hickory Tree IgE: <0.1 kU/L
Rough Pigweed  IgE: <0.1 kU/L
Timothy Grass: <0.1 kU/L

## 2017-12-20 LAB — INTERPRETATION:

## 2018-01-09 ENCOUNTER — Encounter: Payer: Self-pay | Admitting: Internal Medicine

## 2018-01-09 ENCOUNTER — Ambulatory Visit: Payer: Managed Care, Other (non HMO) | Admitting: Internal Medicine

## 2018-01-09 ENCOUNTER — Ambulatory Visit (INDEPENDENT_AMBULATORY_CARE_PROVIDER_SITE_OTHER): Payer: Managed Care, Other (non HMO) | Admitting: Internal Medicine

## 2018-01-09 VITALS — BP 140/84 | HR 88 | Ht 62.0 in | Wt 223.0 lb

## 2018-01-09 DIAGNOSIS — J45991 Cough variant asthma: Secondary | ICD-10-CM

## 2018-01-09 DIAGNOSIS — R05 Cough: Secondary | ICD-10-CM

## 2018-01-09 DIAGNOSIS — R058 Other specified cough: Secondary | ICD-10-CM

## 2018-01-09 LAB — NITRIC OXIDE: NITRIC OXIDE: 10

## 2018-01-09 MED ORDER — GABAPENTIN 100 MG PO CAPS: 100.0000 mg | ORAL_CAPSULE | Freq: Three times a day (TID) | ORAL | 2 refills | Status: DC

## 2018-01-09 MED ORDER — CETIRIZINE HCL 10 MG PO TABS: 10.0000 mg | ORAL_TABLET | Freq: Every day | ORAL | Status: DC

## 2018-01-09 NOTE — Patient Instructions (Addendum)
Omeprazole should be 40 mg Take 30- 60 min before your first and last meals of the day   Gabapentin 100 mg three times a day   Try off symbicort and if night time cough worse then add back just the night time dose to see if helps again  Try taking the  zyrtec 10 mg in am when you can test whether you can tolerate it and whether it helps daytime coughing   Please schedule a follow up office visit in 4 weeks, sooner if needed  with all medications /inhalers/ solutions in hand so we can verify exactly what you are taking. This includes all medications from all doctors and over the counters

## 2018-01-09 NOTE — Progress Notes (Signed)
Brittany Johnson, female    DOB: 29-Mar-1971    MRN: [id number]    Brief patient profile:  80 yowf from [place] / mixed race mother french/vietnamese was  father was [place] and she was healthy child / two term pregnancies by age 47  But by age 34 noted pattern of recurrent pattern while in [place] of "allergies" spring and fall of runny nose and cough turning into "bronchitis" freq requiring abx /sometimes prednisone and saba but no maint rx and pattern continued when moved to [place] age 59 which was around 2009 ? Improved transiently then much worse x 2017 requiring same rx including albuterol /short course prednisone and in between fine but then around mid to late May 2019 similar onset rhinitis then cough ever since so eval by Dr Edwena Blow rx with same rx plus prilosec and some better but much worse with speaking so referred to pulmonary clinic 12/19/2017 by Dr   Inda Castle.  Works for [employer].   States mother always coughed too     History of Present Illness  12/19/2017  f/u ov/Brittany Johnson re:  Chief Complaint  Patient presents with  . Pulmonary Consult    Referred by Dr. Earlie Counts. Pt c/o cough x 6 wks- occ prod with clear sputum. She states tends to get worse when she lies down and wakes her up.  She has an albuterol inhaler that she uses 2 x daily on average.   Dyspnea:  Only if coughing Cough: shrill /dry worse p several hours supine q noct but also with voice use  Sleep: on 2 pillows  SABA use: several times a day, seems to help - tessalon no help   On macobid for a week changed to bactrim 12/17/17 - no prior macrobid exp  rec Change omeprazole to 40 mg Take 30- 60 min before your first and last meals of the day  Change clariton to zytrec 10 mg at bedtime Take delsym two tsp every 12 hours and supplement if needed with  tramadol 50 mg up to 1-2 every 4 hours to suppress the urge to cough. Swallowing water and/or using ice chips/non mint and menthol containing  candies (such as lifesavers or sugarless jolly ranchers) are also effective.  You should rest your voice and avoid activities that you know make you cough. Once you have eliminated the cough for 3 straight days try reducing the tramadol first,  then the delsym as tolerated.   start symbicort 80 Take 2 puffs first thing in am and then another 2 puffs about 12 hours later.  Work on inhaler technique:   Please remember to go to the lab department downstairs in the basement  for your tests - we will call you with the results when they are available. Please schedule a follow up office visit in 3 weeks, sooner if needed  with all medications /inhalers/ solutions in hand so we can verify exactly what you are taking. This includes all medications from all doctors and over the counters       01/09/2018  f/u ov/[name] re: cough since at least 2017, much worse since May 2019 : dx ?cough variant asthma / did no return with meds  Chief Complaint  Patient presents with  . Follow-up    SOB has improved but she is still coughing some. Her cough is prod with clear sputum. She has not had to use her albuterol.  Dyspnea:  Not limited by breathing from desired activities  Unless  severe cough / however, cough gets worse with ex all conditions indoors vs outdoors Cough: tiny amt of mucus esp in ams Sleeping: better since last ov now rarely any noct cough p symb and zyrtec at hs   SABA use: none 02: none     Kouffman Reflux v Neurogenic Cough Differentiator Reflux Comments  Do you awaken from a sound sleep coughing violently?                            With trouble breathing? No rarely   Do you have choking episodes when you cannot  Get enough air, gasping for air ?              occ am's   Do you usually cough when you lie down into  The bed, or when you just lie down to rest ?                          sometimes   Do you usually cough after meals or eating?         No change    Do you cough when (or after)  you bend over?    no   GERD SCORE     Kouffman Reflux v Neurogenic Cough Differentiator Neurogenic   Do you more-or-less cough all day long? yes   Does change of temperature make you cough? no   Does laughing or chuckling cause you to cough? prob not   Do fumes (perfume, automobile fumes, burned  Toast, etc.,) cause you to cough ?      no   Does speaking, singing, or talking on the phone cause you to cough   ?               Yes    Neurogenic/Airway score        No obvious day to day or daytime variability or assoc excess/ purulent sputum or mucus plugs or hemoptysis or cp or chest tightness, subjective wheeze or overt sinus or hb symptoms.     Also denies any obvious fluctuation of symptoms with weather or environmental changes or other aggravating or alleviating factors except as outlined above   No unusual exposure hx or h/o childhood pna/ asthma or knowledge of premature birth.  Current Allergies, Complete Past Medical History, Past Surgical History, Family History, and Social History were reviewed in [facility] record.  ROS  The following are not active complaints unless bolded Hoarseness, sore throat, dysphagia, dental problems, itching, sneezing,  nasal congestion or discharge of excess mucus or purulent secretions, ear ache,   fever, chills, sweats, unintended wt loss or wt gain, classically pleuritic or exertional cp,  orthopnea pnd or arm/hand swelling  or leg swelling, presyncope, palpitations, abdominal pain, anorexia, nausea, vomiting, diarrhea  or change in bowel habits or change in bladder habits, change in stools or change in urine, dysuria, hematuria,  rash, arthralgias, visual complaints, headache, numbness, weakness or ataxia or problems with walking or coordination,  change in mood or  memory.        Current Meds  Medication Sig  . albuterol (PROVENTIL HFA;VENTOLIN HFA) 108 (90 Base) MCG/ACT inhaler Inhale 2 puffs into the lungs every 6 (six)  hours as needed for wheezing or shortness of breath.  . betamethasone dipropionate (DIPROLENE) 0.05 % cream Apply topically 2 (two) times daily.  Marland Kitchen levonorgestrel (MIRENA, 52 MG,) 20 MCG/24HR  IUD 1 each by Intrauterine route once.  Marland Kitchen omeprazole (PRILOSEC) 40 MG capsule Take 30- 60 min before your first and last meals of the day  .   budesonide-formoterol (SYMBICORT) 80-4.5 MCG/ACT inhaler Inhale 2 puffs into the lungs 2 (two) times daily.                       Objective:    amb mixed race female with freq throat clearing and occ harsh honking dry cough    Wt Readings from Last 3 Encounters:  01/09/18 223 lb (101.2 kg)  12/19/17 221 lb (100.2 kg)  11/30/17 215 lb (97.5 kg)     Vital signs reviewed - Note on arrival 02 sats  98% on RA     01/09/2018  HEENT: nl dentition, turbinates bilaterally, and oropharynx. Nl external ear canals without cough reflex   NECK :  without JVD/Nodes/TM/ nl carotid upstrokes bilaterally   LUNGS: no acc muscle use,  Nl contour chest which is clear to A and P bilaterally without cough on insp or exp maneuvers   CV:  RRR  no s3 or murmur or increase in P2, and no edema   ABD:  soft and nontender with nl inspiratory excursion in the supine position. No bruits or organomegaly appreciated, bowel sounds nl  MS:  Nl gait/ ext warm without deformities, calf tenderness, cyanosis or clubbing No obvious joint restrictions   SKIN: warm and dry without lesions    NEURO:  alert, approp, nl sensorium with  no motor or cerebellar deficits apparent.             I personally reviewed images and agree with radiology impression as follows:  CXR:   12/05/17    No acute cardiopulmonary disease.         Assessment

## 2018-01-10 ENCOUNTER — Encounter: Payer: Self-pay | Admitting: Internal Medicine

## 2018-01-10 NOTE — Assessment & Plan Note (Signed)
12/19/17 trial of symb 80 2bid and zyrtec 10 mg hs > noct cough resolved   - FENO 01/09/2018  =   10 on symb 80 2bid  - 01/09/2018 try off symbicort and if noct cough recurs then start back just the hs dose    The daytime cough is more typical of uacs and may be aggravated by use of even low dose ics in hfa form so will do a reverse therapeutic trial and try off the symb and use the zyrtec  q am while maint max rx for gerd and see if noct cough recurs and if so resume prev rx   I had an extended discussion with the patient reviewing all relevant studies completed to date and  lasting 15 to 20 minutes of a 25 minute visit    Each maintenance medication was reviewed in detail including most importantly the difference between maintenance and prns and under what circumstances the prns are to be triggered using an action plan format that is not reflected in the computer generated alphabetically organized AVS.    Please see AVS for specific instructions unique to this visit that I personally wrote and verbalized to the the pt in detail and then reviewed with pt  by my nurse highlighting any  changes in therapy recommended at today's visit to their plan of care.

## 2018-01-10 NOTE — Assessment & Plan Note (Addendum)
Allergy profile 12/19/2017 >  Eos 0.2 /  IgE  41 RAST pos only trees, mold, dust - see cough variant asthma 07/17/2681  - cyclical cough rx 09/11/9620 >>> could not tolerate tramadol daytime "too woozy"  - changed zyrtec to qam and rec gabapentin trial 01/09/2018 >>>  It appears she really has two patterns of cough:  One noct could be cough variant asthma (see separate a/p) but the other is more typical of uacs and Of the three most common causes of  Sub-acute / recurrent or chronic cough, only one (GERD)  can actually contribute to/ trigger  the other two (asthma and post nasal drip syndrome)  and perpetuate the cylce of cough.  While not intuitively obvious, many patients with chronic low grade reflux do not cough until there is a primary insult that disturbs the protective epithelial barrier and exposes sensitive nerve endings.   This is typically viral but can due to PNDS and  either may apply here.      The point is that once this occurs, it is difficult to eliminate the cycle  using anything but a maximally effective acid suppression regimen at least in the short run, accompanied by an appropriate diet to address non acid GERD, eliminate pnds with zyrtec changed to qam if tol,  and control / eliminate the cough itself with gabapentin since can't tol tramadol and start with 100 mg tid and build to 300 tid if tol or refer to [facility]/ Dr Joya Gaskins

## 2018-01-20 DIAGNOSIS — N390 Urinary tract infection, site not specified: Secondary | ICD-10-CM | POA: Diagnosis not present

## 2018-02-06 ENCOUNTER — Encounter: Payer: Managed Care, Other (non HMO) | Admitting: Internal Medicine

## 2018-02-15 ENCOUNTER — Ambulatory Visit (HOSPITAL_BASED_OUTPATIENT_CLINIC_OR_DEPARTMENT_OTHER)
Admission: RE | Admit: 2018-02-15 | Discharge: 2018-02-15 | Disposition: A | Discharge status: Home / Self Care | Payer: 59 | Source: Ambulatory Visit | Attending: Family Medicine | Admitting: Family Medicine

## 2018-02-15 ENCOUNTER — Ambulatory Visit (INDEPENDENT_AMBULATORY_CARE_PROVIDER_SITE_OTHER): Payer: Managed Care, Other (non HMO) | Admitting: Family Medicine

## 2018-02-15 DIAGNOSIS — M79642 Pain in left hand: Secondary | ICD-10-CM | POA: Insufficient documentation

## 2018-02-15 DIAGNOSIS — I10 Essential (primary) hypertension: Secondary | ICD-10-CM | POA: Diagnosis not present

## 2018-02-15 MED ORDER — BETAMETHASONE DIPROPIONATE 0.05 % EX CREA: TOPICAL_CREAM | Freq: Two times a day (BID) | CUTANEOUS | 1 refills | Status: DC

## 2018-02-15 MED ORDER — MELOXICAM 15 MG PO TABS: 15.0000 mg | ORAL_TABLET | Freq: Every day | ORAL | 3 refills | Status: DC | PRN

## 2018-02-15 MED ORDER — CHLORTHALIDONE 25 MG PO TABS: 25.0000 mg | ORAL_TABLET | Freq: Every day | ORAL | 3 refills | Status: DC

## 2018-02-15 NOTE — Assessment & Plan Note (Signed)
Well controlled, no changes to meds. Encouraged heart healthy diet such as the DASH diet and exercise as tolerated.  °

## 2018-02-15 NOTE — Assessment & Plan Note (Signed)
Has noted trouble with swelling in 4 th finger for several months and then she hurt he finger recently and she is now having trouble with moving the finger and she notes some numbness. She is encouraged to try Meloxicam with Lidocaine gel and we will check and xray and refer to hand surgeon.

## 2018-02-15 NOTE — Progress Notes (Signed)
Subjective:  I acted as a Education administrator for Dr. Charlett Blake. [place], [place]  Patient ID: Brittany Johnson, female    DOB: 11-22-1970, 47 y.o.   MRN: [id number]  No chief complaint on file.   HPI  Patient is in today for an acute visit for left hand pain. She states she was walking up her steps and she fell thus hitting her finger and causing it to hurt, swell and discolor. It has continued to have some numbness, pain and discoloration. Denies CP/palp/SOB/HA/congestion/fevers/GI or GU c/o. Taking meds as prescribed  Patient Care Team: [name], NP as PCP - General (Internal Medicine) Lyndal Pulley, DO as Attending Physician (Sports Medicine)   Past Medical History:  Diagnosis Date  . Abnormal cervical cytology 02/07/2011   ?  Marland Kitchen Bronchitis 05/25/2012  . Chicken pox as a child  . Fibroid   . Hepatitis B   . Hx: UTI (urinary tract infection) 02/07/2011  . Hypertension    in the past  . Hypokalemia 05/25/2012  . Left lateral epicondylitis 09/02/2011  . Menorrhagia 11/24/2012  . Obese 03/04/2011  . Palpitations 07/08/2012   in the past  . Psoriasis 09/02/2011  . Right shoulder strain 03/29/2012  . Shingles   . Tinea pedis of right foot 03/07/2011    Past Surgical History:  Procedure Laterality Date  . DILATION AND CURETTAGE OF UTERUS  2003   SAB  . INTRAUTERINE DEVICE (IUD) INSERTION     mirena inserted 05-16-16    Family History  Problem Relation Age of Onset  . Diabetes Mother        type 2  . Hypertension Mother   . Hepatitis Mother        Hep B, died with liver issues  . Heart attack Father   . Hypertension Father   . Cancer Father        prostate  . Cancer Paternal Grandmother        ?    Social History   Socioeconomic History  . Marital status: Married    Spouse name: Not on file  . Number of children: Not on file  . Years of education: Not on file  . Highest education level: Not on file  Occupational History  . Not on file  Social Needs  .  Financial resource strain: Not on file  . Food insecurity:    Worry: Not on file    Inability: Not on file  . Transportation needs:    Medical: Not on file    Non-medical: Not on file  Tobacco Use  . Smoking status: Never Smoker  . Smokeless tobacco: Never Used  Substance and Sexual Activity  . Alcohol use: No  . Drug use: No  . Sexual activity: Yes    Partners: Male    Birth control/protection: IUD  Lifestyle  . Physical activity:    Days per week: Not on file    Minutes per session: Not on file  . Stress: Not on file  Relationships  . Social connections:    Talks on phone: Not on file    Gets together: Not on file    Attends religious service: Not on file    Active member of club or organization: Not on file    Attends meetings of clubs or organizations: Not on file    Relationship status: Not on file  . Intimate partner violence:    Fear of current or ex partner: Not on file    Emotionally abused:  Not on file    Physically abused: Not on file    Forced sexual activity: Not on file  Other Topics Concern  . Not on file  Social History Narrative   Married for 23 yrs   2 step children- grown   2001 Son   2005 daughter   Works as an Scientist, water quality at [employer]    Completed bachelor's degree   Enjoys outdoor activities, golf, reading   Restaurant manager, fast food    Outpatient Medications Prior to Visit  Medication Sig Dispense Refill  . cetirizine (ZYRTEC ALLERGY) 10 MG tablet Take 1 tablet (10 mg total) by mouth daily.    Marland Kitchen levonorgestrel (MIRENA, 52 MG,) 20 MCG/24HR IUD 1 each by Intrauterine route once.    Marland Kitchen omeprazole (PRILOSEC) 40 MG capsule Take 30- 60 min before your first and last meals of the day 60 capsule 3  . albuterol (PROVENTIL HFA;VENTOLIN HFA) 108 (90 Base) MCG/ACT inhaler Inhale 2 puffs into the lungs every 6 (six) hours as needed for wheezing or shortness of breath. 1 Inhaler 0  . betamethasone dipropionate (DIPROLENE) 0.05 % cream Apply topically 2  (two) times daily. 30 g 1  . gabapentin (NEURONTIN) 100 MG capsule Take 1 capsule (100 mg total) by mouth 3 (three) times daily. One three times daily 90 capsule 2   No facility-administered medications prior to visit.     No Known Allergies  Review of Systems  Constitutional: Negative for fever and malaise/fatigue.  HENT: Negative for congestion.   Eyes: Negative for blurred vision.  Respiratory: Negative for shortness of breath.   Cardiovascular: Negative for chest pain, palpitations and leg swelling.  Gastrointestinal: Negative for abdominal pain, blood in stool and nausea.  Genitourinary: Negative for dysuria and frequency.  Musculoskeletal: Positive for joint pain. Negative for falls.  Skin: Negative for rash.  Neurological: Negative for dizziness, loss of consciousness and headaches.  Endo/Heme/Allergies: Negative for environmental allergies.  Psychiatric/Behavioral: Negative for depression. The patient is not nervous/anxious.        Objective:    Physical Exam  Constitutional: She is oriented to person, place, and time. She appears well-developed and well-nourished. No distress.  HENT:  Head: Normocephalic and atraumatic.  Nose: Nose normal.  Eyes: Right eye exhibits no discharge. Left eye exhibits no discharge.  Neck: Normal range of motion. Neck supple.  Cardiovascular: Normal rate and regular rhythm.  No murmur heard. Pulmonary/Chest: Effort normal and breath sounds normal.  Abdominal: Soft. Bowel sounds are normal. There is no tenderness.  Musculoskeletal: She exhibits no edema.  Neurological: She is alert and oriented to person, place, and time.  Skin: Skin is warm and dry.  Psychiatric: She has a normal mood and affect.  Nursing note and vitals reviewed.   LMP 02/15/2018  Wt Readings from Last 3 Encounters:  01/09/18 223 lb (101.2 kg)  12/19/17 221 lb (100.2 kg)  11/30/17 215 lb (97.5 kg)   BP Readings from Last 3 Encounters:  01/09/18 140/84  12/19/17  (!) 142/92  11/30/17 140/88     Immunization History  Administered Date(s) Administered  . Influenza Split 03/04/2011, 03/29/2012  . Influenza,inj,Quad PF,6+ Mos 02/26/2013, 03/31/2014, 06/26/2015, 07/12/2017  . Td 06/13/2006  . Tdap 03/04/2011    Health Maintenance  Topic Date Due  . HIV Screening  08/27/1984  . INFLUENZA VACCINE  01/11/2018  . MAMMOGRAM  12/06/2018  . PAP SMEAR  10/06/2020  . TETANUS/TDAP  03/03/2021    Lab Results  Component Value Date  WBC 10.0 12/19/2017   HGB 15.3 (H) 12/19/2017   HCT 44.9 12/19/2017   PLT 201.0 12/19/2017   GLUCOSE 85 07/12/2017   CHOL 127 07/12/2017   TRIG 120.0 07/12/2017   HDL 38.80 (L) 07/12/2017   LDLCALC 64 07/12/2017   ALT 17 07/12/2017   AST 16 07/12/2017   NA 140 07/12/2017   K 4.0 07/12/2017   CL 105 07/12/2017   CREATININE 0.99 07/12/2017   BUN 12 07/12/2017   CO2 28 07/12/2017   TSH 2.09 07/12/2017    Lab Results  Component Value Date   TSH 2.09 07/12/2017   Lab Results  Component Value Date   WBC 10.0 12/19/2017   HGB 15.3 (H) 12/19/2017   HCT 44.9 12/19/2017   MCV 90.4 12/19/2017   PLT 201.0 12/19/2017   Lab Results  Component Value Date   NA 140 07/12/2017   K 4.0 07/12/2017   CO2 28 07/12/2017   GLUCOSE 85 07/12/2017   BUN 12 07/12/2017   CREATININE 0.99 07/12/2017   BILITOT 0.9 07/12/2017   ALKPHOS 42 07/12/2017   AST 16 07/12/2017   ALT 17 07/12/2017   PROT 7.2 07/12/2017   ALBUMIN 4.0 07/12/2017   CALCIUM 9.0 07/12/2017   GFR 63.66 07/12/2017   Lab Results  Component Value Date   CHOL 127 07/12/2017   Lab Results  Component Value Date   HDL 38.80 (L) 07/12/2017   Lab Results  Component Value Date   LDLCALC 64 07/12/2017   Lab Results  Component Value Date   TRIG 120.0 07/12/2017   Lab Results  Component Value Date   CHOLHDL 3 07/12/2017   No results found for: HGBA1C       Assessment & Plan:   Problem List Items Addressed This Visit    HTN (hypertension)     Well controlled, no changes to meds. Encouraged heart healthy diet such as the DASH diet and exercise as tolerated.       Relevant Medications   chlorthalidone (HYGROTON) 25 MG tablet   Other Relevant Orders   Rheumatoid Factor   Pain of left hand - Primary    Has noted trouble with swelling in 4 th finger for several months and then she hurt he finger recently and she is now having trouble with moving the finger and she notes some numbness. She is encouraged to try Meloxicam with Lidocaine gel and we will check and xray and refer to hand surgeon.       Relevant Orders   DG Hand Complete Left   Ambulatory referral to Hand Surgery   Rheumatoid Factor   Antinuclear Antib (ANA)      I have discontinued Brittany Johnson "Maddy"'s betamethasone dipropionate, albuterol, gabapentin, and betamethasone dipropionate. I am also having her start on chlorthalidone and meloxicam. Additionally, I am having her maintain her levonorgestrel, omeprazole, and cetirizine.  Meds ordered this encounter  Medications  . DISCONTD: betamethasone dipropionate (DIPROLENE) 0.05 % cream    Sig: Apply topically 2 (two) times daily.    Dispense:  30 g    Refill:  1  . chlorthalidone (HYGROTON) 25 MG tablet    Sig: Take 1 tablet (25 mg total) by mouth daily.    Dispense:  30 tablet    Refill:  3  . meloxicam (MOBIC) 15 MG tablet    Sig: Take 1 tablet (15 mg total) by mouth daily as needed for pain.    Dispense:  30 tablet    Refill:  3    CMA served as Education administrator during this visit. History, Physical and Plan performed by medical provider. Documentation and orders reviewed and attested to.  Penni Homans, MD

## 2018-02-15 NOTE — Patient Instructions (Signed)

## 2018-02-18 LAB — RHEUMATOID FACTOR

## 2018-02-18 LAB — ANA: Anti Nuclear Antibody(ANA): NEGATIVE

## 2018-03-06 ENCOUNTER — Ambulatory Visit: Payer: Managed Care, Other (non HMO)

## 2018-05-24 ENCOUNTER — Encounter: Payer: Self-pay | Admitting: Family

## 2018-06-01 ENCOUNTER — Ambulatory Visit: Payer: Managed Care, Other (non HMO) | Admitting: Family

## 2018-06-03 DIAGNOSIS — J0100 Acute maxillary sinusitis, unspecified: Secondary | ICD-10-CM | POA: Diagnosis not present

## 2018-06-22 ENCOUNTER — Ambulatory Visit: Payer: Managed Care, Other (non HMO) | Admitting: Family

## 2018-06-27 ENCOUNTER — Ambulatory Visit: Payer: 59 | Admitting: Family

## 2018-06-27 ENCOUNTER — Encounter: Payer: Self-pay | Admitting: Family

## 2018-06-27 VITALS — BP 118/88 | HR 89 | Temp 99.0°F | Resp 16 | Ht 62.0 in | Wt 219.0 lb

## 2018-06-27 DIAGNOSIS — J329 Chronic sinusitis, unspecified: Secondary | ICD-10-CM | POA: Diagnosis not present

## 2018-06-27 DIAGNOSIS — R053 Chronic cough: Secondary | ICD-10-CM

## 2018-06-27 DIAGNOSIS — J302 Other seasonal allergic rhinitis: Secondary | ICD-10-CM

## 2018-06-27 DIAGNOSIS — R05 Cough: Secondary | ICD-10-CM

## 2018-06-27 MED ORDER — AMOXICILLIN-POT CLAVULANATE 875-125 MG PO TABS: 1.0000 | ORAL_TABLET | Freq: Two times a day (BID) | ORAL | 0 refills | Status: DC

## 2018-06-27 NOTE — Patient Instructions (Addendum)
Begin augmentin for sinus infection. Resart zyrtec and omeprazole for cough. You should be contacted about your referral to the allergist. Please arrange follow up with Dr. Melvyn Novas.   Please call if symptoms worsen or if symptoms fail to improve.

## 2018-06-27 NOTE — Progress Notes (Signed)
Subjective:    Patient ID: Brittany Johnson, female    DOB: 06/22/1970, 48 y.o.   MRN: [id number]  HPI  Patient is a 48 yr old female who presents today with chief complaint of cough. Cough has been intermittent for several months. She also reports + facial pain began 2 days ago.  Started mucinex, which increased drainage and worsened her cough.     Reports that she saw pulmonology. She was given muscle relaxers/tramadol and PPI. Reports that it seemed to get better around November.  Only took the PPI for 14 ddays because she was afraid to continue. Also, reports that she didn't have any heartburn so she thought that she didn't need it. Reports that around christmas she ws treated for bronchitis.  Reports that it "has not really gone away."     Review of Systems See HPI  Past Medical History:  Diagnosis Date  . Abnormal cervical cytology 02/07/2011   ?  Marland Kitchen Bronchitis 05/25/2012  . Chicken pox as a child  . Fibroid   . Hepatitis B   . Hx: UTI (urinary tract infection) 02/07/2011  . Hypertension    in the past  . Hypokalemia 05/25/2012  . Left lateral epicondylitis 09/02/2011  . Menorrhagia 11/24/2012  . Obese 03/04/2011  . Palpitations 07/08/2012   in the past  . Psoriasis 09/02/2011  . Right shoulder strain 03/29/2012  . Shingles   . Tinea pedis of right foot 03/07/2011     Social History   Socioeconomic History  . Marital status: Married    Spouse name: Not on file  . Number of children: Not on file  . Years of education: Not on file  . Highest education level: Not on file  Occupational History  . Not on file  Social Needs  . Financial resource strain: Not on file  . Food insecurity:    Worry: Not on file    Inability: Not on file  . Transportation needs:    Medical: Not on file    Non-medical: Not on file  Tobacco Use  . Smoking status: Never Smoker  . Smokeless tobacco: Never Used  Substance and Sexual Activity  . Alcohol use: No  . Drug use: No  . Sexual  activity: Yes    Partners: Male    Birth control/protection: I.U.D.  Lifestyle  . Physical activity:    Days per week: Not on file    Minutes per session: Not on file  . Stress: Not on file  Relationships  . Social connections:    Talks on phone: Not on file    Gets together: Not on file    Attends religious service: Not on file    Active member of club or organization: Not on file    Attends meetings of clubs or organizations: Not on file    Relationship status: Not on file  . Intimate partner violence:    Fear of current or ex partner: Not on file    Emotionally abused: Not on file    Physically abused: Not on file    Forced sexual activity: Not on file  Other Topics Concern  . Not on file  Social History Narrative   Married for 23 yrs   2 step children- grown   2001 Son   2005 daughter   Works as an Scientist, water quality at [employer]    Completed bachelor's degree   Enjoys outdoor activities, golf, reading   Restaurant manager, fast food    Past  Surgical History:  Procedure Laterality Date  . DILATION AND CURETTAGE OF UTERUS  2003   SAB  . INTRAUTERINE DEVICE (IUD) INSERTION     mirena inserted 05-16-16    Family History  Problem Relation Age of Onset  . Diabetes Mother        type 2  . Hypertension Mother   . Hepatitis Mother        Hep B, died with liver issues  . Heart attack Father   . Hypertension Father   . Cancer Father        prostate  . Cancer Paternal Grandmother        ?    No Known Allergies  Current Outpatient Medications on File Prior to Visit  Medication Sig Dispense Refill  . cetirizine (ZYRTEC ALLERGY) 10 MG tablet Take 1 tablet (10 mg total) by mouth daily.    . chlorthalidone (HYGROTON) 25 MG tablet Take 1 tablet (25 mg total) by mouth daily. 30 tablet 3  . levonorgestrel (MIRENA, 52 MG,) 20 MCG/24HR IUD 1 each by Intrauterine route once.    Marland Kitchen omeprazole (PRILOSEC) 40 MG capsule Take 30- 60 min before your first and last meals of the day 60  capsule 3   No current facility-administered medications on file prior to visit.     BP 118/88 (BP Location: Right Arm, Patient Position: Sitting, Cuff Size: Large)   Pulse 89   Temp 99 F (37.2 C) (Oral)   Resp 16   Ht 5\' 2"  (1.575 m)   Wt 219 lb (99.3 kg)   SpO2 99%   BMI 40.06 kg/m       Objective:   Physical Exam Constitutional:      Appearance: She is well-developed.  HENT:     Head:     Comments: Posterior oropharynx- post nasal drainage is noted.     Right Ear: Tympanic membrane and ear canal normal.     Left Ear: Tympanic membrane and ear canal normal.     Mouth/Throat:     Mouth: Mucous membranes are moist.  Neck:     Musculoskeletal: Neck supple.     Thyroid: No thyromegaly.  Cardiovascular:     Rate and Rhythm: Normal rate and regular rhythm.     Heart sounds: Normal heart sounds. No murmur.  Pulmonary:     Effort: Pulmonary effort is normal. No respiratory distress.     Breath sounds: Normal breath sounds. No wheezing.  Skin:    General: Skin is warm and dry.  Neurological:     Mental Status: She is alert and oriented to person, place, and time.  Psychiatric:        Behavior: Behavior normal.        Thought Content: Thought content normal.        Judgment: Judgment normal.           Assessment & Plan:  Sinusitis- rx with augmentin.  Chronic cough- We discussed that it is OK to continue PPI >14 days and that often reflux is silent and does not have any other symptoms besides cough.  Advised pt as follows: Resart zyrtec and omeprazole for cough. You should be contacted about your referral to the allergist. Please arrange follow up with Dr. Melvyn Novas.   Please call if symptoms worsen or if symptoms fail to improve.    Seasonal allergies- zyrtec and referral to allergist for further evaluation/treatment.

## 2018-07-04 ENCOUNTER — Other Ambulatory Visit: Payer: Self-pay | Admitting: Family Medicine

## 2018-07-04 DIAGNOSIS — I10 Essential (primary) hypertension: Secondary | ICD-10-CM

## 2018-07-11 DIAGNOSIS — J3081 Allergic rhinitis due to animal (cat) (dog) hair and dander: Secondary | ICD-10-CM | POA: Diagnosis not present

## 2018-07-11 DIAGNOSIS — R05 Cough: Secondary | ICD-10-CM | POA: Diagnosis not present

## 2018-07-11 DIAGNOSIS — J3089 Other allergic rhinitis: Secondary | ICD-10-CM | POA: Diagnosis not present

## 2018-07-11 DIAGNOSIS — J301 Allergic rhinitis due to pollen: Secondary | ICD-10-CM | POA: Diagnosis not present

## 2018-07-13 ENCOUNTER — Encounter: Payer: Managed Care, Other (non HMO) | Admitting: Family

## 2018-07-13 DIAGNOSIS — Z0289 Encounter for other administrative examinations: Secondary | ICD-10-CM

## 2018-08-02 DIAGNOSIS — K760 Fatty (change of) liver, not elsewhere classified: Secondary | ICD-10-CM | POA: Diagnosis not present

## 2018-08-28 ENCOUNTER — Encounter: Payer: 59 | Admitting: Family Medicine

## 2018-08-30 ENCOUNTER — Encounter: Payer: Self-pay | Admitting: Family Medicine

## 2018-08-30 ENCOUNTER — Ambulatory Visit (INDEPENDENT_AMBULATORY_CARE_PROVIDER_SITE_OTHER): Payer: 59 | Admitting: Family Medicine

## 2018-08-30 ENCOUNTER — Other Ambulatory Visit: Payer: Self-pay

## 2018-08-30 VITALS — BP 101/70 | HR 77 | Temp 98.2°F | Resp 18 | Ht 62.0 in | Wt 215.4 lb

## 2018-08-30 DIAGNOSIS — I10 Essential (primary) hypertension: Secondary | ICD-10-CM

## 2018-08-30 DIAGNOSIS — J301 Allergic rhinitis due to pollen: Secondary | ICD-10-CM | POA: Diagnosis not present

## 2018-08-30 DIAGNOSIS — J3081 Allergic rhinitis due to animal (cat) (dog) hair and dander: Secondary | ICD-10-CM | POA: Diagnosis not present

## 2018-08-30 DIAGNOSIS — J3089 Other allergic rhinitis: Secondary | ICD-10-CM | POA: Diagnosis not present

## 2018-08-31 DIAGNOSIS — J301 Allergic rhinitis due to pollen: Secondary | ICD-10-CM | POA: Diagnosis not present

## 2018-08-31 DIAGNOSIS — J3089 Other allergic rhinitis: Secondary | ICD-10-CM | POA: Diagnosis not present

## 2018-08-31 DIAGNOSIS — J3081 Allergic rhinitis due to animal (cat) (dog) hair and dander: Secondary | ICD-10-CM | POA: Diagnosis not present

## 2018-08-31 NOTE — Progress Notes (Signed)
Patient had to leave before being seen.

## 2018-09-04 ENCOUNTER — Other Ambulatory Visit: Payer: Self-pay | Admitting: Internal Medicine

## 2018-09-08 ENCOUNTER — Other Ambulatory Visit: Payer: Self-pay | Admitting: Family Medicine

## 2018-09-26 ENCOUNTER — Telehealth: Payer: Self-pay | Admitting: Family

## 2018-09-26 NOTE — Telephone Encounter (Signed)
Left msg for patient to call back and make appt for cpe in Late June early July

## 2018-10-01 ENCOUNTER — Telehealth: Payer: Self-pay | Admitting: Family

## 2018-10-01 NOTE — Telephone Encounter (Signed)
Called pt to schedule CPE, left VM for pt to call back and schedule

## 2018-10-12 ENCOUNTER — Ambulatory Visit: Payer: 59 | Admitting: Certified Nurse Midwife

## 2018-10-26 ENCOUNTER — Other Ambulatory Visit: Payer: Self-pay | Admitting: Family

## 2018-10-30 ENCOUNTER — Ambulatory Visit (INDEPENDENT_AMBULATORY_CARE_PROVIDER_SITE_OTHER): Payer: 59 | Admitting: Internal Medicine

## 2018-10-30 ENCOUNTER — Encounter: Payer: Self-pay | Admitting: Internal Medicine

## 2018-10-30 ENCOUNTER — Other Ambulatory Visit: Payer: Self-pay

## 2018-10-30 VITALS — BP 122/80 | HR 72 | Temp 98.0°F | Ht 62.0 in | Wt 217.0 lb

## 2018-10-30 DIAGNOSIS — J45991 Cough variant asthma: Secondary | ICD-10-CM | POA: Diagnosis not present

## 2018-10-30 DIAGNOSIS — R058 Other specified cough: Secondary | ICD-10-CM

## 2018-10-30 DIAGNOSIS — R05 Cough: Secondary | ICD-10-CM

## 2018-10-30 NOTE — Progress Notes (Signed)
Brittany Johnson, female    DOB: 10-Oct-1970    MRN: [id number]    Brief patient profile:  48 yowf from [place] / mixed race mother french/vietnamese was  father was [place] and she was healthy child / two term pregnancies by age 48  But by age 48 noted pattern of recurrent pattern while in [place] of "allergies" spring and fall of runny nose and cough turning into "bronchitis" freq requiring abx /sometimes prednisone and saba but no maint rx and pattern continued when moved to [place] age 48 which was around 2009 ? Improved transiently then much worse x 2017 requiring same rx including albuterol /short course prednisone and in between fine but then around mid to late May 2019 similar onset rhinitis then cough ever since so eval by Dr Inda Castle rx with same rx plus prilosec and some better but much worse with speaking so referred to pulmonary clinic 12/19/2017 by Dr   Inda Castle.  Works for [employer].   States mother always coughed too     History of Present Illness  12/19/2017  f/u ov/Brittany Johnson re:  Chief Complaint  Patient presents with   Pulmonary Consult    Referred by Dr. Earlie Counts. Pt c/o cough x 6 wks- occ prod with clear sputum. She states tends to get worse when she lies down and wakes her up.  She has an albuterol inhaler that she uses 2 x daily on average.   Dyspnea:  Only if coughing Cough: shrill /dry worse p several hours supine q noct but also with voice use  Sleep: on 2 pillows  SABA use: several times a day, seems to help - tessalon no help  On macobid for a week changed to bactrim 12/17/17 - no prior macrobid exp  rec Change omeprazole to 40 mg Take 30- 60 min before your first and last meals of the day  Change clariton to zytrec 10 mg at bedtime Take delsym two tsp every 12 hours and supplement if needed with  tramadol 50 mg up to 1-2 every 4 hours to suppress the urge to cough. Swallowing water and/or using ice chips/non mint and menthol containing  candies (such as lifesavers or sugarless jolly ranchers) are also effective.  You should rest your voice and avoid activities that you know make you cough. Once you have eliminated the cough for 3 straight days try reducing the tramadol first,  then the delsym as tolerated.   start symbicort 80 Take 2 puffs first thing in am and then another 2 puffs about 12 hours later.  Work on inhaler technique:   Please remember to go to the lab department downstairs in the basement  for your tests - we will call you with the results when they are available. Please schedule a follow up office visit in 3 weeks, sooner if needed  with all medications /inhalers/ solutions in hand so we can verify exactly what you are taking. This includes all medications from all doctors and over the counters       01/09/2018  f/u ov/Brittany Johnson re: cough since at least 2017, much worse since May 2019 : dx ?cough variant asthma / did not return with meds  Chief Complaint  Patient presents with   Follow-up    SOB has improved but she is still coughing some. Her cough is prod with clear sputum. She has not had to use her albuterol.  Dyspnea:  Not limited by breathing from desired activities  Unless severe  cough / however, cough gets worse with ex all conditions indoors vs outdoors Cough: tiny amt of mucus esp in ams Sleeping: better since last ov now rarely any noct cough p symb and zyrtec at hs   rec Omeprazole should be 40 mg Take 30- 60 min before your first and last meals of the day  Gabapentin 100 mg three times a day  Try off symbicort and if night time cough worse then add back just the night time dose to see if helps again Try taking the  zyrtec 10 mg in am when you can test whether you can tolerate it and whether it helps daytime coughing  Please schedule a follow up office visit in 4 weeks, sooner if needed  with all medications /inhalers/ solutions in hand so we can verify exactly what you are taking. This includes all  medications from all doctors and over the counters  Gabapentin d/c'd from med list 02/15/18 ? Response?  Sharma evaluation Jan 2020 rec shots/singulair> requested     10/30/2018  f/u ov/Brittany Johnson re: cough since 2017 / singulair/xyzal and symb 80 2bid though hfa poor Chief Complaint  Patient presents with   Acute Visit    employer concerned she is still coughing  Dyspnea:  Not limited by breathing from desired activities  / brisk walks ok, some inclines Cough: sporadic but def worse outdoors / but coughs indoors and hs   Sleeping: immediately at hs then burns it outself out p 10 min then sleeps rest night and when awakens though not prematurely  > tsp white mucus from back of throat in am, o/w mostly dry  SABA use: on symbicort 80 2bid - has saba not using  02: none    No obvious day to day or daytime variability or assoc excess/ purulent sputum or mucus plugs or hemoptysis or cp or chest tightness, subjective wheeze or overt sinus or hb symptoms.     Also denies any obvious fluctuation of symptoms with weather or environmental changes or other aggravating or alleviating factors except as outlined above   No unusual exposure hx or h/o childhood pna/ asthma or knowledge of premature birth.  Current Allergies, Complete Past Medical History, Past Surgical History, Family History, and Social History were reviewed in [facility] record.  ROS  The following are not active complaints unless bolded Hoarseness, sore throat, dysphagia, dental problems, itching, sneezing,  nasal congestion or discharge of excess mucus or purulent secretions, ear ache,   fever, chills, sweats, unintended wt loss or wt gain, classically pleuritic or exertional cp,  orthopnea pnd or arm/hand swelling  or leg swelling, presyncope, palpitations, abdominal pain, anorexia, nausea, vomiting, diarrhea  or change in bowel habits or change in bladder habits, change in stools or change in urine, dysuria,  hematuria,  rash, arthralgias, visual complaints, headache, numbness, weakness or ataxia or problems with walking or coordination,  change in mood or  memory.        Current Meds  Medication Sig   betamethasone dipropionate (DIPROLENE) 0.05 % cream APPLY TO AFFECTED AREA TWICE A DAY   chlorthalidone (HYGROTON) 25 MG tablet TAKE 1 TABLET BY MOUTH EVERY DAY   fluticasone (FLONASE) 50 MCG/ACT nasal spray SPRAY 1-2 SPRAYS IN EACH NOSTRIL ONCE A DAY   levocetirizine (XYZAL) 5 MG tablet every evening.   levonorgestrel (MIRENA, 52 MG,) 20 MCG/24HR IUD 1 each by Intrauterine route once.   montelukast (SINGULAIR) 10 MG tablet Take 10 mg by mouth daily.  omeprazole (PRILOSEC) 40 MG capsule TAKE 30- 60 MIN BEFORE YOUR FIRST AND LAST MEALS OF THE DAY *INS PAY 1 PER DAY*                             Objective:    amb female nad / occ throat clearing    10/30/2018       217   01/09/18 223 lb (101.2 kg)  12/19/17 221 lb (100.2 kg)  11/30/17 215 lb (97.5 kg)     Vital signs reviewed - Note on arrival 02 sats  97% on RA     HEENT: nl dentition, turbinates bilaterally, and oropharynx. Nl external ear canals without cough reflex   NECK :  without JVD/Nodes/TM/ nl carotid upstrokes bilaterally   LUNGS: no acc muscle use,  Nl contour chest which is clear to A and P bilaterally without cough on insp or exp maneuvers   CV:  RRR  no s3 or murmur or increase in P2, and no edema   ABD:  soft and nontender with nl inspiratory excursion in the supine position. No bruits or organomegaly appreciated, bowel sounds nl  MS:  Nl gait/ ext warm without deformities, calf tenderness, cyanosis or clubbing No obvious joint restrictions   SKIN: warm and dry without lesions    NEURO:  alert, approp, nl sensorium with  no motor or cerebellar deficits apparent.            Assessment

## 2018-10-30 NOTE — Patient Instructions (Addendum)
Try off the pm dose of symbicort to see what difference if any it makes and if worse restart it  - if no difference after a month I would try off the symbicort 80  - if need go back on it, push it back up the max dose immediately for at least at week to see the full benefit   Work on inhaler technique:  relax and gently blow all the way out then take a nice smooth deep breath back in, triggering the inhaler at same time you start breathing in.  Hold for up to 5 seconds if you can. Blow out thru nose. Rinse and gargle with water when done      You may have irritable larynx syndrome and you may need to Carol Ada for that (ENT appt at [facility])     Pulmonary follow up is as needed  Late add:   >>>  Gabapentin d/c'd from med list 02/15/18 at ov where cough is not mentioned as a concern> will ask pt for clarification re response or lack thereof

## 2018-11-02 ENCOUNTER — Telehealth: Payer: Self-pay | Admitting: *Deleted

## 2018-11-02 ENCOUNTER — Encounter: Payer: Self-pay | Admitting: Internal Medicine

## 2018-11-02 NOTE — Assessment & Plan Note (Signed)
Onset age 48 but much worse since 2017  12/19/17 trial of symb 80 2bid and zyrtec 10 mg hs > noct cough resolved  - FENO 01/09/2018  =   10 on symb 80 2bid  - 10/30/2018  After extensive coaching inhaler device,  effectiveness =    75% from a baseline of 50% so try just using am dose to see if noct symptoms flare and if not then taper the am dose off but back to full rx (2 bid for worse cough)   The throat clearing is much more typical of uacs (see separate a/p) than asthma and may be aggrated by use of ICS which at baseline she wasn't using optimally anyway so rec rx this as mild intermittent asthma and titrate symbicort up/down Based on two studies from NEJM  378; 20 p 1865 (2018) and 380 : p2020-30 (2019) in pts with mild asthma it is reasonable to use low dose symbicort eg 80 2bid "prn" flare in this setting but I emphasized this was only shown with symbicort and takes advantage of the rapid onset of action but is not the same as "rescue therapy" but can be stopped once the acute symptoms have resolved and the need for rescue has been minimized (< 2 x weekly)

## 2018-11-02 NOTE — Telephone Encounter (Signed)
-----   Message from Tanda Rockers, MD sent at 11/02/2018  9:43 AM EDT ----- Forgot to ask her if took gabapentin 100 tid and whether it helped

## 2018-11-02 NOTE — Assessment & Plan Note (Addendum)
Onset in her 30's living in [place] Allergy profile 12/19/2017 >  Eos 0.2 /  IgE  41 RAST pos only trees, mold, dust - see cough variant asthma 08/16/6813  - cyclical cough rx 02/15/7075 >>> could not tolerate tramadol daytime "too woozy"  - gabapentin trial 01/09/2018 >>>  >>>  Gabapentin d/c'd from med list 02/15/18 at ov where cough is not mentioned as a concern Donneta Romberg evaluation Jan 2020 rec shots/singulair> requested records 10/30/2018   Ideally she needs MCT to tease out uacs vs cough variant asthma once  COVID - 19 restrictions have been lifted but in meantime if not improving could consider refer to Dr Carol Ada at [facility] for eval of irritable larynx.    I had an extended discussion with the patient reviewing all relevant studies completed to date and  lasting 15 to 20 minutes of a 25 minute visit    See device teaching which extended face to face time for this visit.  Each maintenance medication was reviewed in detail including emphasizing most importantly the difference between maintenance and prns and under what circumstances the prns are to be triggered using an action plan format that is not reflected in the computer generated alphabetically organized AVS which I have not found useful in most complex patients, especially with respiratory illnesses  Please see AVS for specific instructions unique to this visit that I personally wrote and verbalized to the the pt in detail and then reviewed with pt  by my nurse highlighting any  changes in therapy recommended at today's visit to their plan of care.

## 2018-11-02 NOTE — Telephone Encounter (Signed)
error 

## 2018-11-09 ENCOUNTER — Other Ambulatory Visit: Payer: Self-pay | Admitting: Family Medicine

## 2018-11-09 DIAGNOSIS — I10 Essential (primary) hypertension: Secondary | ICD-10-CM

## 2018-12-05 ENCOUNTER — Other Ambulatory Visit: Payer: Self-pay

## 2018-12-07 ENCOUNTER — Ambulatory Visit: Payer: 59 | Admitting: Family

## 2018-12-25 ENCOUNTER — Other Ambulatory Visit: Payer: Self-pay

## 2018-12-25 ENCOUNTER — Ambulatory Visit (INDEPENDENT_AMBULATORY_CARE_PROVIDER_SITE_OTHER): Payer: 59 | Admitting: Certified Nurse Midwife

## 2018-12-25 ENCOUNTER — Other Ambulatory Visit (HOSPITAL_COMMUNITY)
Admission: RE | Admit: 2018-12-25 | Discharge: 2018-12-25 | Disposition: A | Discharge status: Home / Self Care | Payer: 59 | Source: Ambulatory Visit | Attending: Certified Nurse Midwife | Admitting: Certified Nurse Midwife

## 2018-12-25 ENCOUNTER — Encounter: Payer: Self-pay | Admitting: Certified Nurse Midwife

## 2018-12-25 VITALS — BP 118/76 | HR 70 | Temp 97.2°F | Resp 16 | Ht 62.25 in | Wt 213.0 lb

## 2018-12-25 DIAGNOSIS — Z0000 Encounter for general adult medical examination without abnormal findings: Secondary | ICD-10-CM | POA: Diagnosis not present

## 2018-12-25 DIAGNOSIS — Z124 Encounter for screening for malignant neoplasm of cervix: Secondary | ICD-10-CM | POA: Insufficient documentation

## 2018-12-25 DIAGNOSIS — Z01419 Encounter for gynecological examination (general) (routine) without abnormal findings: Secondary | ICD-10-CM

## 2018-12-25 LAB — POCT URINALYSIS DIPSTICK
Bilirubin, UA: NEGATIVE
Blood, UA: NEGATIVE
Glucose, UA: NEGATIVE
Ketones, UA: NEGATIVE
Leukocytes, UA: NEGATIVE
Nitrite, UA: NEGATIVE
Protein, UA: NEGATIVE
Urobilinogen, UA: NEGATIVE E.U./dL — AB
pH, UA: 7 (ref 5.0–8.0)

## 2018-12-25 LAB — HM PAP SMEAR: HM Pap smear: NEGATIVE

## 2018-12-25 NOTE — Progress Notes (Signed)
48 y.o. G33P0022 Married Caucasian female  Fe here for annual exam. Contraception Mirena IUD with  periods being light and monthly. Has noted urine odor, no frequency or urgency or fever, please check. Working on staying active and maintaining no weight gain. Sees [name] for aex, labs, all normal per patient. Continues with Hepatitis follow up as recommended. No other health issues today.   Patient's last menstrual period was 12/18/2018 (exact date).          Sexually active: Yes.    The current method of family planning is IUD.    Exercising: Yes.    walking Smoker:  no  Review of Systems  Constitutional: Negative.   HENT: Negative.   Eyes: Negative.   Respiratory: Negative.   Cardiovascular: Negative.   Gastrointestinal: Negative.   Genitourinary: Negative.   Musculoskeletal: Negative.   Skin: Negative.   Neurological: Negative.   Endo/Heme/Allergies: Negative.   Psychiatric/Behavioral: Negative.     Health Maintenance: Pap:  03-28-16 neg HPV HR neg, 10-06-17 neg History of Abnormal Pap: no MMG:  12-05-17 category c density birads 1:neg Self Breast exams: yes Colonoscopy:  none BMD:   none TDaP:  2012 Shingles: no Pneumonia: no Hep C and HIV: hep c neg 2017 Labs: urine only  POCT urine negative ph 7.0   reports that she has never smoked. She has never used smokeless tobacco. She reports that she does not drink alcohol or use drugs.  Past Medical History:  Diagnosis Date  . Abnormal cervical cytology 02/07/2011   ?  Marland Kitchen Bronchitis 05/25/2012  . Chicken pox as a child  . Fibroid   . Hepatitis B   . Hx: UTI (urinary tract infection) 02/07/2011  . Hypertension    in the past  . Hypokalemia 05/25/2012  . Left lateral epicondylitis 09/02/2011  . Menorrhagia 11/24/2012  . Obese 03/04/2011  . Palpitations 07/08/2012   in the past  . Psoriasis 09/02/2011  . Right shoulder strain 03/29/2012  . Shingles   . Tinea pedis of right foot 03/07/2011    Past Surgical  History:  Procedure Laterality Date  . DILATION AND CURETTAGE OF UTERUS  2003   SAB  . INTRAUTERINE DEVICE (IUD) INSERTION     mirena inserted 05-16-16    Current Outpatient Medications  Medication Sig Dispense Refill  . betamethasone dipropionate (DIPROLENE) 0.05 % cream APPLY TO AFFECTED AREA TWICE A DAY 30 g 1  . chlorthalidone (HYGROTON) 25 MG tablet TAKE 1 TABLET BY MOUTH EVERY DAY 30 tablet 3  . fluticasone (FLONASE) 50 MCG/ACT nasal spray SPRAY 1-2 SPRAYS IN EACH NOSTRIL ONCE A DAY    . levocetirizine (XYZAL) 5 MG tablet every evening.    Marland Kitchen levonorgestrel (MIRENA, 52 MG,) 20 MCG/24HR IUD 1 each by Intrauterine route once.     No current facility-administered medications for this visit.     Family History  Problem Relation Age of Onset  . Diabetes Mother        type 2  . Hypertension Mother   . Hepatitis Mother        Hep B, died with liver issues  . Heart attack Father   . Hypertension Father   . Cancer Father        prostate  . Cancer Paternal Grandmother        ?    ROS:  Pertinent items are noted in HPI.  Otherwise, a comprehensive ROS was negative.  Exam:   BP 118/76   Pulse 70  Temp (!) 97.2 F (36.2 C) (Skin)   Resp 16   Ht 5' 2.25" (1.581 m)   Wt 213 lb (96.6 kg)   LMP 12/18/2018 (Exact Date)   BMI 38.65 kg/m  Height: 5' 2.25" (158.1 cm) Ht Readings from Last 3 Encounters:  12/25/18 5' 2.25" (1.581 m)  10/30/18 5\' 2"  (1.575 m)  08/30/18 5\' 2"  (1.575 m)    General appearance: alert, cooperative and appears stated age Head: Normocephalic, without obvious abnormality, atraumatic Neck: no adenopathy, supple, symmetrical, trachea midline and thyroid normal to inspection and palpation Lungs: clear to auscultation bilaterally Breasts: normal appearance, no masses or tenderness, No nipple retraction or dimpling, No nipple discharge or bleeding, No axillary or supraclavicular adenopathy Heart: regular rate and rhythm Abdomen: soft, non-tender; no  masses,  no organomegaly Extremities: extremities normal, atraumatic, no cyanosis or edema Skin: Skin color, texture, turgor normal. No rashes or lesions Lymph nodes: Cervical, supraclavicular, and axillary nodes normal. No abnormal inguinal nodes palpated Neurologic: Grossly normal   Pelvic: External genitalia:  no lesions              Urethra:  normal appearing urethra with no masses, tenderness or lesions              Bartholin's and Skene's: normal                 Vagina: normal appearing vagina with normal color and discharge, no lesions              Cervix: no lesions, retroverted and normal appearance and no CMT, IUD string noted in cervix              Pap taken: Yes.   Bimanual Exam:  Uterus:  enlarged, 11-12 week size weeks size and anteverted   No size change per last exam              Adnexa: normal adnexa and no mass, fullness, tenderness               Rectovaginal: Confirms               Anus:  normal sphincter tone, no lesions  Chaperone present: yes  A:  Well Woman with normal exam  Contraception Liletta IUD due for removal on 05/16/2021  Urine negative  Enlarged uterus no size change from previous exam, history of fibroid  Bronchitis, hypertension management, labs with PCP  Hepatitis B carrier under follow up     P:   Reviewed health and wellness pertinent to exam  Warning signs with IUD reviewed and need to advise if occurs.  Discussed need to increase water intake, and urine was negative today. Will call if change.  Discussed no size change in uterus from last exam. Will advise if change in menses.  Continue follow up with MD as indicated  Pap smear: yes   counseled on breast self exam,  mammography screening, feminine hygiene, adequate intake of calcium and vitamin D, diet and exercise  return annually or prn  An After Visit Summary was printed and given to the patient.

## 2018-12-27 LAB — CYTOLOGY - PAP
Diagnosis: NEGATIVE
HPV: NOT DETECTED

## 2019-01-15 ENCOUNTER — Ambulatory Visit (INDEPENDENT_AMBULATORY_CARE_PROVIDER_SITE_OTHER): Payer: 59 | Admitting: Family

## 2019-01-15 ENCOUNTER — Other Ambulatory Visit: Payer: Self-pay

## 2019-01-15 ENCOUNTER — Encounter: Payer: Self-pay | Admitting: Family

## 2019-01-15 VITALS — BP 109/68 | HR 71 | Temp 98.2°F | Resp 16 | Ht 62.0 in | Wt 214.6 lb

## 2019-01-15 DIAGNOSIS — Z0000 Encounter for general adult medical examination without abnormal findings: Secondary | ICD-10-CM

## 2019-01-15 DIAGNOSIS — I10 Essential (primary) hypertension: Secondary | ICD-10-CM

## 2019-01-15 DIAGNOSIS — H6692 Otitis media, unspecified, left ear: Secondary | ICD-10-CM

## 2019-01-15 DIAGNOSIS — Z0001 Encounter for general adult medical examination with abnormal findings: Secondary | ICD-10-CM

## 2019-01-15 MED ORDER — BETAMETHASONE DIPROPIONATE 0.05 % EX CREA: TOPICAL_CREAM | CUTANEOUS | 1 refills | Status: AC

## 2019-01-15 MED ORDER — AMOXICILLIN 500 MG PO CAPS: 500.0000 mg | ORAL_CAPSULE | Freq: Three times a day (TID) | ORAL | 0 refills | Status: DC

## 2019-01-15 NOTE — Patient Instructions (Signed)
Begin amoxicillin for your left ear infection. Add flonase 2 sprays to each nostril once daily for the next few weeks to help with your ear pressure. Call if symptoms worsen or if symptoms fail to improve.

## 2019-01-15 NOTE — Progress Notes (Signed)
Subjective:    Patient ID: Brittany Johnson, female    DOB: May 18, 1971, 48 y.o.   MRN: [id number]  HPI  Patient is a 48 yr old female who presents today for complete physical.  Patient presents today for complete physical.  Immunizations: tetanus 2012 Diet:  fair Wt Readings from Last 3 Encounters:  01/15/19 214 lb 9.6 oz (97.3 kg)  12/25/18 213 lb (96.6 kg)  10/30/18 217 lb (98.4 kg)  Exercise: reports that she is walking frequently Colonoscopy: will start at 49 Pap Smear:12/25/18 Mammogram: due Dental:  Up to date Vision: up to date     Review of Systems  Constitutional: Negative for unexpected weight change.  HENT: Positive for ear pain (bilateral ear pressure. L>R) and postnasal drip (allergy related). Negative for hearing loss.   Eyes: Negative for visual disturbance.  Respiratory: Negative for cough and shortness of breath.   Cardiovascular: Negative for chest pain.  Gastrointestinal: Negative for constipation and diarrhea.  Genitourinary: Negative for dysuria, frequency and hematuria.  Musculoskeletal: Negative for arthralgias and myalgias.  Skin: Negative for rash.  Neurological: Negative for headaches.  Hematological: Negative for adenopathy.  Psychiatric/Behavioral:       Denies depression/anxiety   Past Medical History:  Diagnosis Date  . Abnormal cervical cytology 02/07/2011   ?  Marland Kitchen Bronchitis 05/25/2012  . Chicken pox as a child  . Fibroid   . Hepatitis B   . Hx: UTI (urinary tract infection) 02/07/2011  . Hypertension    in the past  . Hypokalemia 05/25/2012  . Left lateral epicondylitis 09/02/2011  . Menorrhagia 11/24/2012  . Obese 03/04/2011  . Palpitations 07/08/2012   in the past  . Psoriasis 09/02/2011  . Right shoulder strain 03/29/2012  . Shingles   . Tinea pedis of right foot 03/07/2011     Social History   Socioeconomic History  . Marital status: Married    Spouse name: Not on file  . Number of children: Not on file  . Years of  education: Not on file  . Highest education level: Not on file  Occupational History  . Not on file  Social Needs  . Financial resource strain: Not on file  . Food insecurity    Worry: Not on file    Inability: Not on file  . Transportation needs    Medical: Not on file    Non-medical: Not on file  Tobacco Use  . Smoking status: Never Smoker  . Smokeless tobacco: Never Used  Substance and Sexual Activity  . Alcohol use: No  . Drug use: No  . Sexual activity: Yes    Partners: Male    Birth control/protection: I.U.D.  Lifestyle  . Physical activity    Days per week: Not on file    Minutes per session: Not on file  . Stress: Not on file  Relationships  . Social Herbalist on phone: Not on file    Gets together: Not on file    Attends religious service: Not on file    Active member of club or organization: Not on file    Attends meetings of clubs or organizations: Not on file    Relationship status: Not on file  . Intimate partner violence    Fear of current or ex partner: Not on file    Emotionally abused: Not on file    Physically abused: Not on file    Forced sexual activity: Not on file  Other Topics Concern  .  Not on file  Social History Narrative   Married for 23 yrs   2 step children- grown   2001 Son   2005 daughter   Works as an Scientist, water quality at [employer]    Completed bachelor's degree   Enjoys outdoor activities, golf, reading   Restaurant manager, fast food    Past Surgical History:  Procedure Laterality Date  . DILATION AND CURETTAGE OF UTERUS  2003   SAB  . INTRAUTERINE DEVICE (IUD) INSERTION     mirena inserted 05-16-16    Family History  Problem Relation Age of Onset  . Diabetes Mother        type 2  . Hypertension Mother   . Hepatitis Mother        Hep B, died with liver issues  . Heart attack Father        ? "infection on the outside of his heart"  . Hypertension Father   . Cancer Father        prostate  . Cancer Paternal  Grandmother        ?    No Known Allergies  Current Outpatient Medications on File Prior to Visit  Medication Sig Dispense Refill  . betamethasone dipropionate (DIPROLENE) 0.05 % cream APPLY TO AFFECTED AREA TWICE A DAY 30 g 1  . chlorthalidone (HYGROTON) 25 MG tablet TAKE 1 TABLET BY MOUTH EVERY DAY 30 tablet 3  . fluticasone (FLONASE) 50 MCG/ACT nasal spray SPRAY 1-2 SPRAYS IN EACH NOSTRIL ONCE A DAY    . levocetirizine (XYZAL) 5 MG tablet every evening.    Marland Kitchen levonorgestrel (MIRENA, 52 MG,) 20 MCG/24HR IUD 1 each by Intrauterine route once.     No current facility-administered medications on file prior to visit.     BP 109/68 (BP Location: Right Arm, Patient Position: Sitting, Cuff Size: Large)   Pulse 71   Temp 98.2 F (36.8 C) (Oral)   Resp 16   Ht 5\' 2"  (1.575 m)   Wt 214 lb 9.6 oz (97.3 kg)   LMP 12/18/2018 (Exact Date)   SpO2 99%   BMI 39.25 kg/m       Objective:   Physical Exam  Physical Exam  Constitutional: She is oriented to person, place, and time. She appears well-developed and well-nourished. No distress.  HENT:  Head: Normocephalic and atraumatic.  Right Ear: Tympanic membrane and ear canal normal.  Left Ear: Tympanic membrane is retracted, +erythema noted Mouth/Throat: Oropharynx is clear and moist.  Eyes: Pupils are equal, round, and reactive to light. No scleral icterus.  Neck: Normal range of motion. No thyromegaly present.  Cardiovascular: Normal rate and regular rhythm.   No murmur heard. Pulmonary/Chest: Effort normal and breath sounds normal. No respiratory distress. He has no wheezes. She has no rales. She exhibits no tenderness.  Abdominal: Soft. Bowel sounds are normal. She exhibits no distension and no mass. There is no tenderness. There is no rebound and no guarding.  Musculoskeletal: She exhibits no edema.  Lymphadenopathy:    She has no cervical adenopathy.  Neurological: She is alert and oriented to person, place, and time. She has  normal patellar reflexes. She exhibits normal muscle tone. Coordination normal.  Skin: Skin is warm and dry.  Psychiatric: She has a normal mood and affect. Her behavior is normal. Judgment and thought content normal.  Breasts: Examined lying Right: Without masses, retractions, discharge or axillary adenopathy.  Left: Without masses, retractions, discharge or axillary adenopathy.  Pelvic: deferred  Assessment & Plan:   Preventative care- discussed healthy diet, exercise.  Pap up to date.  Immunizations reviewed and up to date.   Left otitis media- rx with amoxicillin, flonase.    HTN- bp looks good. Continue current dose of chlorthalidone.        Assessment & Plan:

## 2019-01-16 ENCOUNTER — Telehealth: Payer: Self-pay | Admitting: Family

## 2019-01-16 ENCOUNTER — Other Ambulatory Visit: Payer: Self-pay

## 2019-01-16 DIAGNOSIS — E876 Hypokalemia: Secondary | ICD-10-CM

## 2019-01-16 LAB — CBC WITH DIFFERENTIAL/PLATELET
Basophils Absolute: 0.1 10*3/uL (ref 0.0–0.1)
Basophils Relative: 1.3 % (ref 0.0–3.0)
Eosinophils Absolute: 0.1 10*3/uL (ref 0.0–0.7)
Eosinophils Relative: 2 % (ref 0.0–5.0)
HCT: 44.4 % (ref 36.0–46.0)
Hemoglobin: 14.9 g/dL (ref 12.0–15.0)
Lymphocytes Relative: 33.9 % (ref 12.0–46.0)
Lymphs Abs: 2.4 10*3/uL (ref 0.7–4.0)
MCHC: 33.5 g/dL (ref 30.0–36.0)
MCV: 90.2 fl (ref 78.0–100.0)
Monocytes Absolute: 0.6 10*3/uL (ref 0.1–1.0)
Monocytes Relative: 7.8 % (ref 3.0–12.0)
Neutro Abs: 4 10*3/uL (ref 1.4–7.7)
Neutrophils Relative %: 55 % (ref 43.0–77.0)
Platelets: 235 10*3/uL (ref 150.0–400.0)
RBC: 4.92 Mil/uL (ref 3.87–5.11)
RDW: 13.5 % (ref 11.5–15.5)
WBC: 7.2 10*3/uL (ref 4.0–10.5)

## 2019-01-16 LAB — BASIC METABOLIC PANEL
BUN: 12 mg/dL (ref 6–23)
CO2: 30 mEq/L (ref 19–32)
Calcium: 9.4 mg/dL (ref 8.4–10.5)
Chloride: 103 mEq/L (ref 96–112)
Creatinine, Ser: 1.16 mg/dL (ref 0.40–1.20)
GFR: 49.57 mL/min — ABNORMAL LOW (ref >60.00)
Glucose, Bld: 89 mg/dL (ref 70–99)
Potassium: 3.3 mEq/L — ABNORMAL LOW (ref 3.5–5.1)
Sodium: 141 mEq/L (ref 135–145)

## 2019-01-16 LAB — LDL CHOLESTEROL, DIRECT: Direct LDL: 86 mg/dL

## 2019-01-16 LAB — HEPATIC FUNCTION PANEL
ALT: 25 U/L (ref 0–35)
AST: 21 U/L (ref 0–37)
Albumin: 4.1 g/dL (ref 3.5–5.2)
Alkaline Phosphatase: 49 U/L (ref 39–117)
Bilirubin, Direct: 0.1 mg/dL (ref 0.0–0.3)
Total Bilirubin: 0.5 mg/dL (ref 0.2–1.2)
Total Protein: 6.7 g/dL (ref 6.0–8.3)

## 2019-01-16 LAB — LIPID PANEL
Cholesterol: 149 mg/dL (ref 0–200)
HDL: 33.9 mg/dL — ABNORMAL LOW (ref >39.00)
NonHDL: 115.32
Total CHOL/HDL Ratio: 4
Triglycerides: 211 mg/dL — ABNORMAL HIGH (ref 0.0–149.0)
VLDL: 42.2 mg/dL — ABNORMAL HIGH (ref 0.0–40.0)

## 2019-01-16 LAB — TSH: TSH: 1.93 u[IU]/mL (ref 0.35–4.50)

## 2019-01-16 MED ORDER — POTASSIUM CHLORIDE CRYS ER 20 MEQ PO TBCR: 20.0000 meq | EXTENDED_RELEASE_TABLET | Freq: Every day | ORAL | 3 refills | Status: DC

## 2019-01-16 NOTE — Telephone Encounter (Signed)
Please contact pt and let her know that her potassium is a little low. Likely due to her chlorthalidone.  I would like her to add kdur 16meq once daily, repeat bmet in 1 week, dx hypokalemia.

## 2019-01-16 NOTE — Telephone Encounter (Signed)
Results given to patient advised to start kdur and appt scheduled for next week lab bmet only

## 2019-01-24 ENCOUNTER — Telehealth: Payer: Self-pay | Admitting: Family

## 2019-01-24 ENCOUNTER — Other Ambulatory Visit: Payer: Self-pay

## 2019-01-24 ENCOUNTER — Other Ambulatory Visit (INDEPENDENT_AMBULATORY_CARE_PROVIDER_SITE_OTHER): Payer: 59

## 2019-01-24 DIAGNOSIS — E876 Hypokalemia: Secondary | ICD-10-CM

## 2019-01-24 LAB — BASIC METABOLIC PANEL
BUN: 15 mg/dL (ref 6–23)
CO2: 30 mEq/L (ref 19–32)
Calcium: 9.8 mg/dL (ref 8.4–10.5)
Chloride: 101 mEq/L (ref 96–112)
Creatinine, Ser: 1.12 mg/dL (ref 0.40–1.20)
GFR: 51.62 mL/min — ABNORMAL LOW (ref >60.00)
Glucose, Bld: 93 mg/dL (ref 70–99)
Potassium: 3.3 mEq/L — ABNORMAL LOW (ref 3.5–5.1)
Sodium: 140 mEq/L (ref 135–145)

## 2019-01-24 NOTE — Telephone Encounter (Signed)
See my chart note.

## 2019-01-27 NOTE — Addendum Note (Signed)
Addended by: [name] on: 01/27/2019 11:00 AM   Modules accepted: Orders

## 2019-02-11 ENCOUNTER — Other Ambulatory Visit: Payer: Self-pay | Admitting: Nurse Practitioner

## 2019-02-11 DIAGNOSIS — B181 Chronic viral hepatitis B without delta-agent: Secondary | ICD-10-CM

## 2019-02-12 ENCOUNTER — Other Ambulatory Visit: Payer: 59

## 2019-02-15 ENCOUNTER — Ambulatory Visit
Admission: RE | Admit: 2019-02-15 | Discharge: 2019-02-15 | Disposition: A | Discharge status: Home / Self Care | Payer: 59 | Source: Ambulatory Visit | Attending: Nurse Practitioner | Admitting: Nurse Practitioner

## 2019-02-15 DIAGNOSIS — B181 Chronic viral hepatitis B without delta-agent: Secondary | ICD-10-CM

## 2019-03-04 ENCOUNTER — Other Ambulatory Visit: Payer: Self-pay | Admitting: *Deleted

## 2019-03-04 DIAGNOSIS — I10 Essential (primary) hypertension: Secondary | ICD-10-CM

## 2019-03-04 MED ORDER — CHLORTHALIDONE 25 MG PO TABS: 25.0000 mg | ORAL_TABLET | Freq: Every day | ORAL | 1 refills | Status: DC

## 2019-03-05 ENCOUNTER — Other Ambulatory Visit: Payer: Self-pay | Admitting: Internal Medicine

## 2019-03-22 ENCOUNTER — Ambulatory Visit
Admission: RE | Admit: 2019-03-22 | Discharge: 2019-03-22 | Disposition: A | Discharge status: Home / Self Care | Payer: 59 | Source: Ambulatory Visit | Attending: Family | Admitting: Family

## 2019-03-22 ENCOUNTER — Other Ambulatory Visit: Payer: Self-pay

## 2019-03-22 DIAGNOSIS — Z0000 Encounter for general adult medical examination without abnormal findings: Secondary | ICD-10-CM

## 2019-06-20 ENCOUNTER — Other Ambulatory Visit: Payer: Self-pay | Admitting: Internal Medicine

## 2019-06-20 ENCOUNTER — Other Ambulatory Visit: Payer: Self-pay | Admitting: Family

## 2019-07-03 ENCOUNTER — Other Ambulatory Visit: Payer: Self-pay | Admitting: Internal Medicine

## 2019-07-05 ENCOUNTER — Other Ambulatory Visit: Payer: Self-pay

## 2019-07-05 NOTE — Telephone Encounter (Signed)
Refill x 3 month supply then ov if she wants me to be responsible for longterm refills

## 2019-07-05 NOTE — Telephone Encounter (Signed)
Refill request for Omeprazole 40mg   Last OV: 10/30/2018 Next OV: Nothing scheduled  Last ordered by : Dr.Wert on 09/04/2018 Quantity: 30 capsules 7 refills Instructions: Take 30-60 minutes before your first and last meal of the day.  Dr. Melvyn Novas please advise. This is not on the patients med list.  No Known Allergies  Current Outpatient Medications on File Prior to Visit  Medication Sig Dispense Refill  . amoxicillin (AMOXIL) 500 MG capsule Take 1 capsule (500 mg total) by mouth 3 (three) times daily. 30 capsule 0  . betamethasone dipropionate (DIPROLENE) 0.05 % cream APPLY TO AFFECTED AREA TWICE A DAY 30 g 1  . chlorthalidone (HYGROTON) 25 MG tablet Take 1 tablet (25 mg total) by mouth daily. 90 tablet 1  . fluticasone (FLONASE) 50 MCG/ACT nasal spray SPRAY 1-2 SPRAYS IN EACH NOSTRIL ONCE A DAY    . KLOR-CON M20 20 MEQ tablet TAKE 1 TABLET BY MOUTH EVERY DAY 30 tablet 3  . levocetirizine (XYZAL) 5 MG tablet every evening.    Marland Kitchen levonorgestrel (MIRENA, 52 MG,) 20 MCG/24HR IUD 1 each by Intrauterine route once.     No current facility-administered medications on file prior to visit.

## 2019-07-19 ENCOUNTER — Other Ambulatory Visit: Payer: Self-pay

## 2019-07-19 ENCOUNTER — Encounter: Payer: Self-pay | Admitting: Family

## 2019-07-19 ENCOUNTER — Ambulatory Visit (INDEPENDENT_AMBULATORY_CARE_PROVIDER_SITE_OTHER): Payer: 59 | Admitting: Family

## 2019-07-19 VITALS — Ht 62.0 in

## 2019-07-19 DIAGNOSIS — I10 Essential (primary) hypertension: Secondary | ICD-10-CM | POA: Diagnosis not present

## 2019-07-19 DIAGNOSIS — F329 Major depressive disorder, single episode, unspecified: Secondary | ICD-10-CM | POA: Diagnosis not present

## 2019-07-19 DIAGNOSIS — E876 Hypokalemia: Secondary | ICD-10-CM

## 2019-07-19 DIAGNOSIS — R002 Palpitations: Secondary | ICD-10-CM

## 2019-07-19 DIAGNOSIS — F32A Depression, unspecified: Secondary | ICD-10-CM

## 2019-07-19 MED ORDER — CHLORTHALIDONE 25 MG PO TABS: 25.0000 mg | ORAL_TABLET | Freq: Every day | ORAL | 1 refills | Status: DC

## 2019-07-19 MED ORDER — ESCITALOPRAM OXALATE 10 MG PO TABS: ORAL_TABLET | ORAL | 0 refills | Status: DC

## 2019-07-19 NOTE — Progress Notes (Signed)
Pre visit review using our clinic review tool, if applicable. No additional management support is needed unless otherwise documented below in the visit note. 

## 2019-07-19 NOTE — Progress Notes (Signed)
Virtual Visit via Video Note  I connected with Brittany Johnson on 07/19/19 at  3:00 PM EST by a video enabled telemedicine application and verified that I am speaking with the correct person using two identifiers.  Location: Patient: home Provider: work   I discussed the limitations of evaluation and management by telemedicine and the availability of in person appointments. The patient expressed understanding and agreed to proceed.  History of Present Illness:  Patient is a 49 yr old female who presents today with two concerns:  Palpitations- reports that she ran out of potassium and developed palpitations. She reports that she restarted potassium 3 weeks ago and has had no further palpitations.  Moodiness- notes increase stress at work.  Feels irritable all the time. Family "has noticed" as well.  She sleeps about 6 hrs a night.  Reports increased tearfulness. No panic attacks. Feels less motivated and feels that she has to push herself more.  Denies SI/HI. She works about 50 hrs a week. Doesn't expect that she will be able to cut her hours back any.   Past Medical History:  Diagnosis Date  . Abnormal cervical cytology 02/07/2011   ?  Marland Kitchen Bronchitis 05/25/2012  . Chicken pox as a child  . Fibroid   . Hepatitis B   . Hx: UTI (urinary tract infection) 02/07/2011  . Hypertension    in the past  . Hypokalemia 05/25/2012  . Left lateral epicondylitis 09/02/2011  . Menorrhagia 11/24/2012  . Obese 03/04/2011  . Palpitations 07/08/2012   in the past  . Psoriasis 09/02/2011  . Right shoulder strain 03/29/2012  . Shingles   . Tinea pedis of right foot 03/07/2011     Social History   Socioeconomic History  . Marital status: Married    Spouse name: Not on file  . Number of children: Not on file  . Years of education: Not on file  . Highest education level: Not on file  Occupational History  . Not on file  Tobacco Use  . Smoking status: Never Smoker  . Smokeless tobacco: Never Used   Substance and Sexual Activity  . Alcohol use: No  . Drug use: No  . Sexual activity: Yes    Partners: Male    Birth control/protection: I.U.D.  Other Topics Concern  . Not on file  Social History Narrative   Married for 23 yrs   2 step children- grown   2001 Son   2005 daughter   Works as an Scientist, water quality at [employer]    Completed bachelor's degree   Enjoys outdoor activities, golf, reading   Restaurant manager, fast food   Social Determinants of Radio broadcast assistant Strain:   . Difficulty of Paying Living Expenses: Not on file  Food Insecurity:   . Worried About Charity fundraiser in the Last Year: Not on file  . Ran Out of Food in the Last Year: Not on file  Transportation Needs:   . Lack of Transportation (Medical): Not on file  . Lack of Transportation (Non-Medical): Not on file  Physical Activity:   . Days of Exercise per Week: Not on file  . Minutes of Exercise per Session: Not on file  Stress:   . Feeling of Stress : Not on file  Social Connections:   . Frequency of Communication with Friends and Family: Not on file  . Frequency of Social Gatherings with Friends and Family: Not on file  . Attends Religious Services: Not on file  .  Active Member of Clubs or Organizations: Not on file  . Attends Archivist Meetings: Not on file  . Marital Status: Not on file  Intimate Partner Violence:   . Fear of Current or Ex-Partner: Not on file  . Emotionally Abused: Not on file  . Physically Abused: Not on file  . Sexually Abused: Not on file    Past Surgical History:  Procedure Laterality Date  . DILATION AND CURETTAGE OF UTERUS  2003   SAB  . INTRAUTERINE DEVICE (IUD) INSERTION     mirena inserted 05-16-16    Family History  Problem Relation Age of Onset  . Diabetes Mother        type 2  . Hypertension Mother   . Hepatitis Mother        Hep B, died with liver issues  . Heart attack Father        ? "infection on the outside of his heart"  .  Hypertension Father   . Cancer Father        prostate  . Cancer Paternal Grandmother        ?    No Known Allergies  Current Outpatient Medications on File Prior to Visit  Medication Sig Dispense Refill  . betamethasone dipropionate (DIPROLENE) 0.05 % cream APPLY TO AFFECTED AREA TWICE A DAY 30 g 1  . chlorthalidone (HYGROTON) 25 MG tablet Take 1 tablet (25 mg total) by mouth daily. 90 tablet 1  . fluticasone (FLONASE) 50 MCG/ACT nasal spray SPRAY 1-2 SPRAYS IN EACH NOSTRIL ONCE A DAY    . KLOR-CON M20 20 MEQ tablet TAKE 1 TABLET BY MOUTH EVERY DAY 30 tablet 3  . levocetirizine (XYZAL) 5 MG tablet every evening.    Marland Kitchen levonorgestrel (MIRENA, 52 MG,) 20 MCG/24HR IUD 1 each by Intrauterine route once.    . SYMBICORT 160-4.5 MCG/ACT inhaler     . amoxicillin (AMOXIL) 500 MG capsule Take 1 capsule (500 mg total) by mouth 3 (three) times daily. (Patient not taking: Reported on 07/19/2019) 30 capsule 0   No current facility-administered medications on file prior to visit.    Ht 5\' 2"  (1.575 m)   BMI 39.25 kg/m      Observations/Objective:   Gen: Awake, alert, no acute distress Resp: Breathing is even and non-labored Psych: calm/pleasant demeanor Neuro: Alert and Oriented x 3, + facial symmetry, speech is clear.   Assessment and Plan:  Palpitations- Resolved following resumption of Kdur. Suspect that it was likely secondary to hypokalemia. Will ask pt to schedule a lab visit obtain follow up BMET.   HTN- on chlorthalidone- will check her blood pressure and send me her reading via mychart this evening.  Depression-  Will begin lexapro 10mg . I instructed pt to start 1/2 tablet once daily for 1 week and then increase to a full tablet once daily on week two as tolerated.  We discussed common side effects such as nausea, drowsiness and weight gain. She is instructed to discontinue medication go directly to ED if this occurs.  Pt verbalizes understanding.  Plan follow up in 1 month to  evaluate progress.      Follow Up Instructions:    I discussed the assessment and treatment plan with the patient. The patient was provided an opportunity to ask questions and all were answered. The patient agreed with the plan and demonstrated an understanding of the instructions.   The patient was advised to call back or seek an in-person evaluation if the symptoms  worsen or if the condition fails to improve as anticipated.  Nance Pear, NP

## 2019-07-22 ENCOUNTER — Other Ambulatory Visit: Payer: Self-pay | Admitting: Internal Medicine

## 2019-07-24 ENCOUNTER — Encounter: Payer: Self-pay | Admitting: Family

## 2019-08-01 ENCOUNTER — Other Ambulatory Visit: Payer: 59

## 2019-08-05 ENCOUNTER — Other Ambulatory Visit: Payer: 59

## 2019-08-07 ENCOUNTER — Other Ambulatory Visit: Payer: Self-pay

## 2019-08-07 ENCOUNTER — Other Ambulatory Visit (INDEPENDENT_AMBULATORY_CARE_PROVIDER_SITE_OTHER): Payer: 59

## 2019-08-07 DIAGNOSIS — E876 Hypokalemia: Secondary | ICD-10-CM

## 2019-08-08 ENCOUNTER — Telehealth: Payer: Self-pay | Admitting: Family

## 2019-08-08 LAB — BASIC METABOLIC PANEL
BUN: 12 mg/dL (ref 6–23)
CO2: 34 mEq/L — ABNORMAL HIGH (ref 19–32)
Calcium: 10.2 mg/dL (ref 8.4–10.5)
Chloride: 98 mEq/L (ref 96–112)
Creatinine, Ser: 1.12 mg/dL (ref 0.40–1.20)
GFR: 51.51 mL/min — ABNORMAL LOW (ref >60.00)
Glucose, Bld: 78 mg/dL (ref 70–99)
Potassium: 3.3 mEq/L — ABNORMAL LOW (ref 3.5–5.1)
Sodium: 141 mEq/L (ref 135–145)

## 2019-08-08 NOTE — Telephone Encounter (Signed)
Patient advised of results and she reports she "forgot to take it for the last 5 days". Patient advised to start taking daily as indicated and scheduled to come in next Thursday to repeat bmet.

## 2019-08-08 NOTE — Telephone Encounter (Signed)
Please contact patient and let her know that her potassium is still low.  Has she been taking the potassium supplement K. Dur once daily?  If so I would recommend that she increase it to twice a day and repeat be met in 1 week.  If she is not taking the potassium once daily please start.  Repeat be met in 1 week.

## 2019-08-12 ENCOUNTER — Other Ambulatory Visit: Payer: Self-pay | Admitting: Family

## 2019-08-14 ENCOUNTER — Telehealth: Payer: Self-pay | Admitting: *Deleted

## 2019-08-14 DIAGNOSIS — E876 Hypokalemia: Secondary | ICD-10-CM

## 2019-08-14 NOTE — Telephone Encounter (Signed)
Pt has lab order 3/4. Future order entered per 08/08/19 phone note.

## 2019-08-15 ENCOUNTER — Other Ambulatory Visit (INDEPENDENT_AMBULATORY_CARE_PROVIDER_SITE_OTHER): Payer: 59

## 2019-08-15 ENCOUNTER — Other Ambulatory Visit: Payer: Self-pay

## 2019-08-15 DIAGNOSIS — E876 Hypokalemia: Secondary | ICD-10-CM | POA: Diagnosis not present

## 2019-08-15 LAB — BASIC METABOLIC PANEL
BUN: 10 mg/dL (ref 6–23)
CO2: 32 mEq/L (ref 19–32)
Calcium: 10 mg/dL (ref 8.4–10.5)
Chloride: 100 mEq/L (ref 96–112)
Creatinine, Ser: 1.08 mg/dL (ref 0.40–1.20)
GFR: 53.71 mL/min — ABNORMAL LOW (ref >60.00)
Glucose, Bld: 90 mg/dL (ref 70–99)
Potassium: 3.4 mEq/L — ABNORMAL LOW (ref 3.5–5.1)
Sodium: 139 mEq/L (ref 135–145)

## 2019-08-16 ENCOUNTER — Encounter: Payer: Self-pay | Admitting: Family

## 2019-08-16 ENCOUNTER — Ambulatory Visit: Payer: 59 | Admitting: Family

## 2019-08-16 ENCOUNTER — Other Ambulatory Visit: Payer: Self-pay

## 2019-08-16 VITALS — BP 111/77 | HR 67 | Temp 96.0°F | Resp 16 | Ht 62.0 in | Wt 201.0 lb

## 2019-08-16 DIAGNOSIS — I10 Essential (primary) hypertension: Secondary | ICD-10-CM | POA: Diagnosis not present

## 2019-08-16 DIAGNOSIS — F329 Major depressive disorder, single episode, unspecified: Secondary | ICD-10-CM

## 2019-08-16 DIAGNOSIS — E876 Hypokalemia: Secondary | ICD-10-CM

## 2019-08-16 DIAGNOSIS — F32A Depression, unspecified: Secondary | ICD-10-CM

## 2019-08-16 MED ORDER — ESCITALOPRAM OXALATE 10 MG PO TABS: 10.0000 mg | ORAL_TABLET | Freq: Every day | ORAL | 1 refills | Status: DC

## 2019-08-16 MED ORDER — CHLORTHALIDONE 25 MG PO TABS: 25.0000 mg | ORAL_TABLET | Freq: Every day | ORAL | 0 refills | Status: DC

## 2019-08-16 MED ORDER — POTASSIUM CHLORIDE CRYS ER 20 MEQ PO TBCR: 30.0000 meq | EXTENDED_RELEASE_TABLET | Freq: Every day | ORAL | 3 refills | Status: DC

## 2019-08-16 NOTE — Progress Notes (Signed)
Subjective:    Patient ID: Brittany Johnson, female    DOB: 06/22/1970, 49 y.o.   MRN: [id number]  HPI   Patient is a 49 yr old female who presents today for follow up.   Depression- last visit we initiated lexapro.  She reports feeling much better since lexapro was started. Reports feeling less moody, handling stress much better. Pleased with the results.  Wt Readings from Last 3 Encounters:  08/16/19 201 lb (91.2 kg)  01/15/19 214 lb 9.6 oz (97.3 kg)  12/25/18 213 lb (96.6 kg)   HTN- maintained on chlorthalidone BP Readings from Last 3 Encounters:  08/16/19 111/77  01/15/19 109/68  12/25/18 118/76   Hypokalemia- pt completed bmet yesterday. Potassium was mildly low at 3.4. Reports taking kdur 32meq once daily.   Review of Systems    see HPI  Past Medical History:  Diagnosis Date  . Abnormal cervical cytology 02/07/2011   ?  Marland Kitchen Bronchitis 05/25/2012  . Chicken pox as a child  . Fibroid   . Hepatitis B   . Hx: UTI (urinary tract infection) 02/07/2011  . Hypertension    in the past  . Hypokalemia 05/25/2012  . Left lateral epicondylitis 09/02/2011  . Menorrhagia 11/24/2012  . Obese 03/04/2011  . Palpitations 07/08/2012   in the past  . Psoriasis 09/02/2011  . Right shoulder strain 03/29/2012  . Shingles   . Tinea pedis of right foot 03/07/2011     Social History   Socioeconomic History  . Marital status: Married    Spouse name: Not on file  . Number of children: Not on file  . Years of education: Not on file  . Highest education level: Not on file  Occupational History  . Not on file  Tobacco Use  . Smoking status: Never Smoker  . Smokeless tobacco: Never Used  Substance and Sexual Activity  . Alcohol use: No  . Drug use: No  . Sexual activity: Yes    Partners: Male    Birth control/protection: I.U.D.  Other Topics Concern  . Not on file  Social History Narrative   Married for 23 yrs   2 step children- grown   2001 Son   2005 daughter   Works as  an Scientist, water quality at [employer]    Completed bachelor's degree   Enjoys outdoor activities, golf, reading   Restaurant manager, fast food   Social Determinants of Radio broadcast assistant Strain:   . Difficulty of Paying Living Expenses: Not on file  Food Insecurity:   . Worried About Charity fundraiser in the Last Year: Not on file  . Ran Out of Food in the Last Year: Not on file  Transportation Needs:   . Lack of Transportation (Medical): Not on file  . Lack of Transportation (Non-Medical): Not on file  Physical Activity:   . Days of Exercise per Week: Not on file  . Minutes of Exercise per Session: Not on file  Stress:   . Feeling of Stress : Not on file  Social Connections:   . Frequency of Communication with Friends and Family: Not on file  . Frequency of Social Gatherings with Friends and Family: Not on file  . Attends Religious Services: Not on file  . Active Member of Clubs or Organizations: Not on file  . Attends Archivist Meetings: Not on file  . Marital Status: Not on file  Intimate Partner Violence:   . Fear of Current or Ex-Partner: Not  on file  . Emotionally Abused: Not on file  . Physically Abused: Not on file  . Sexually Abused: Not on file    Past Surgical History:  Procedure Laterality Date  . DILATION AND CURETTAGE OF UTERUS  2003   SAB  . INTRAUTERINE DEVICE (IUD) INSERTION     mirena inserted 05-16-16    Family History  Problem Relation Age of Onset  . Diabetes Mother        type 2  . Hypertension Mother   . Hepatitis Mother        Hep B, died with liver issues  . Heart attack Father        ? "infection on the outside of his heart"  . Hypertension Father   . Cancer Father        prostate  . Cancer Paternal Grandmother        ?    No Known Allergies  Current Outpatient Medications on File Prior to Visit  Medication Sig Dispense Refill  . betamethasone dipropionate (DIPROLENE) 0.05 % cream APPLY TO AFFECTED AREA TWICE A DAY 30  g 1  . fluticasone (FLONASE) 50 MCG/ACT nasal spray SPRAY 1-2 SPRAYS IN EACH NOSTRIL ONCE A DAY    . levocetirizine (XYZAL) 5 MG tablet every evening.    Marland Kitchen levonorgestrel (MIRENA, 52 MG,) 20 MCG/24HR IUD 1 each by Intrauterine route once.    . SYMBICORT 160-4.5 MCG/ACT inhaler      No current facility-administered medications on file prior to visit.    BP 111/77 (BP Location: Right Arm, Patient Position: Sitting, Cuff Size: Large)   Pulse 67   Temp (!) 96 F (35.6 C) (Temporal)   Resp 16   Ht 5\' 2"  (1.575 m)   Wt 201 lb (91.2 kg)   SpO2 99%   BMI 36.76 kg/m    Objective:   Physical Exam Constitutional:      Appearance: She is well-developed.  Cardiovascular:     Rate and Rhythm: Normal rate and regular rhythm.     Heart sounds: Normal heart sounds. No murmur.  Pulmonary:     Effort: Pulmonary effort is normal. No respiratory distress.     Breath sounds: Normal breath sounds. No wheezing.  Psychiatric:        Behavior: Behavior normal.        Thought Content: Thought content normal.        Judgment: Judgment normal.           Assessment & Plan:  Hypokalemia- increase kdur to bid. Repeat bmet  In 1 week.   HTN- stable on chlorthalidone.  Depression- stable/improved on lexapro. Continue same.  This visit occurred during the SARS-CoV-2 public health emergency.  Safety protocols were in place, including screening questions prior to the visit, additional usage of staff PPE, and extensive cleaning of exam room while observing appropriate contact time as indicated for disinfecting solutions.

## 2019-08-22 ENCOUNTER — Other Ambulatory Visit: Payer: 59

## 2019-08-26 ENCOUNTER — Other Ambulatory Visit: Payer: Self-pay

## 2019-08-26 ENCOUNTER — Other Ambulatory Visit (INDEPENDENT_AMBULATORY_CARE_PROVIDER_SITE_OTHER): Payer: 59

## 2019-08-26 ENCOUNTER — Telehealth: Payer: Self-pay | Admitting: Family

## 2019-08-26 DIAGNOSIS — E876 Hypokalemia: Secondary | ICD-10-CM

## 2019-08-26 LAB — BASIC METABOLIC PANEL
BUN: 12 mg/dL (ref 6–23)
CO2: 31 mEq/L (ref 19–32)
Calcium: 9.6 mg/dL (ref 8.4–10.5)
Chloride: 101 mEq/L (ref 96–112)
Creatinine, Ser: 1.06 mg/dL (ref 0.40–1.20)
GFR: 54.87 mL/min — ABNORMAL LOW (ref >60.00)
Glucose, Bld: 102 mg/dL — ABNORMAL HIGH (ref 70–99)
Potassium: 3 mEq/L — ABNORMAL LOW (ref 3.5–5.1)
Sodium: 141 mEq/L (ref 135–145)

## 2019-08-26 NOTE — Telephone Encounter (Signed)
Patient advised of results and specific instructions to take kdur 2tabs tonight and 2 tabs tomorrow am. Then discontinue kdur and chlortahalidone. Scheduled for ov and labs 09-03-19. Patient verbalized understanding.

## 2019-08-26 NOTE — Telephone Encounter (Signed)
Please contact pt and let her know that her potassium is actually lower this time.  I think the chlorthalidone is causing her K+ to drop.  Since her blood pressure looked so good last visit, I would like to try her off of chlorthalidone.  She should take 2 tabs of Kdur tonight, then 2 tabs of Kdur tomorrow AM.  Then stop potassium supplement. Stop chlorthalidone.  Follow up in 1 week with me for face to face please. We will recheck potassium and blood pressure in the office.

## 2019-09-02 ENCOUNTER — Encounter: Payer: Self-pay | Admitting: Certified Nurse Midwife

## 2019-09-03 ENCOUNTER — Ambulatory Visit: Payer: 59 | Admitting: Family

## 2019-09-03 ENCOUNTER — Other Ambulatory Visit: Payer: Self-pay

## 2019-09-03 ENCOUNTER — Encounter: Payer: Self-pay | Admitting: Family

## 2019-09-03 VITALS — BP 129/70 | HR 74 | Temp 97.1°F | Resp 16 | Wt 207.0 lb

## 2019-09-03 DIAGNOSIS — F32A Depression, unspecified: Secondary | ICD-10-CM

## 2019-09-03 DIAGNOSIS — R635 Abnormal weight gain: Secondary | ICD-10-CM | POA: Diagnosis not present

## 2019-09-03 DIAGNOSIS — F339 Major depressive disorder, recurrent, unspecified: Secondary | ICD-10-CM | POA: Diagnosis not present

## 2019-09-03 DIAGNOSIS — F329 Major depressive disorder, single episode, unspecified: Secondary | ICD-10-CM

## 2019-09-03 DIAGNOSIS — E876 Hypokalemia: Secondary | ICD-10-CM | POA: Diagnosis not present

## 2019-09-03 NOTE — Progress Notes (Signed)
Subjective:    Patient ID: Brittany Johnson, female    DOB: Mar 25, 1971, 49 y.o.   MRN: [id number]  HPI  Patient is a 49 yr old female who presents today for follow up.  Depression-  Patient is maintained on Lexapro. She reports that lexapro continues to be very helpful.  She is concerned that she has gained some weight. Notes she did have her birthday and celebrated.   Wt Readings from Last 3 Encounters:  09/03/19 207 lb (93.9 kg)  08/16/19 201 lb (91.2 kg)  01/15/19 214 lb 9.6 oz (97.3 kg)    Hypokalemia- we repleted her potassium last vist and then d/c'd potassium and chlorthalidone.  HTN- she remains off of chlorthalidone.  BP Readings from Last 3 Encounters:  09/03/19 129/70  08/16/19 111/77  01/15/19 109/68       Review of Systems See HPI  Past Medical History:  Diagnosis Date  . Abnormal cervical cytology 02/07/2011   ?  Marland Kitchen Bronchitis 05/25/2012  . Chicken pox as a child  . Fibroid   . Hepatitis B   . Hx: UTI (urinary tract infection) 02/07/2011  . Hypertension    in the past  . Hypokalemia 05/25/2012  . Left lateral epicondylitis 09/02/2011  . Menorrhagia 11/24/2012  . Obese 03/04/2011  . Palpitations 07/08/2012   in the past  . Psoriasis 09/02/2011  . Right shoulder strain 03/29/2012  . Shingles   . Tinea pedis of right foot 03/07/2011     Social History   Socioeconomic History  . Marital status: Married    Spouse name: Not on file  . Number of children: Not on file  . Years of education: Not on file  . Highest education level: Not on file  Occupational History  . Not on file  Tobacco Use  . Smoking status: Never Smoker  . Smokeless tobacco: Never Used  Substance and Sexual Activity  . Alcohol use: No  . Drug use: No  . Sexual activity: Yes    Partners: Male    Birth control/protection: I.U.D.  Other Topics Concern  . Not on file  Social History Narrative   Married for 23 yrs   2 step children- grown   2001 Son   2005 daughter   Works as an Scientist, water quality at [employer]    Completed bachelor's degree   Enjoys outdoor activities, golf, reading   [place] Strain:   . Difficulty of Paying Living Expenses:   Food Insecurity:   . Worried About Charity fundraiser in the Last Year:   . Arboriculturist in the Last Year:   Transportation Needs:   . Film/video editor (Medical):   Marland Kitchen Lack of Transportation (Non-Medical):   Physical Activity:   . Days of Exercise per Week:   . Minutes of Exercise per Session:   Stress:   . Feeling of Stress :   Social Connections:   . Frequency of Communication with Friends and Family:   . Frequency of Social Gatherings with Friends and Family:   . Attends Religious Services:   . Active Member of Clubs or Organizations:   . Attends Archivist Meetings:   Marland Kitchen Marital Status:   Intimate Partner Violence:   . Fear of Current or Ex-Partner:   . Emotionally Abused:   Marland Kitchen Physically Abused:   . Sexually Abused:     Past Surgical History:  Procedure Laterality  Date  . [place] OF UTERUS  2003   SAB  . INTRAUTERINE DEVICE (IUD) INSERTION     mirena inserted 05-16-16    Family History  Problem Relation Age of Onset  . Diabetes Mother        type 2  . Hypertension Mother   . Hepatitis Mother        Hep B, died with liver issues  . Heart attack Father        ? "infection on the outside of his heart"  . Hypertension Father   . Cancer Father        prostate  . Cancer Paternal Grandmother        ?    No Known Allergies  Current Outpatient Medications on File Prior to Visit  Medication Sig Dispense Refill  . betamethasone dipropionate (DIPROLENE) 0.05 % cream APPLY TO AFFECTED AREA TWICE A DAY 30 g 1  . escitalopram (LEXAPRO) 10 MG tablet Take 1 tablet (10 mg total) by mouth daily. 90 tablet 1  . fluticasone (FLONASE) 50 MCG/ACT nasal spray SPRAY 1-2 SPRAYS IN EACH NOSTRIL ONCE A  DAY    . levocetirizine (XYZAL) 5 MG tablet every evening.    Marland Kitchen levonorgestrel (MIRENA, 52 MG,) 20 MCG/24HR IUD 1 each by Intrauterine route once.    . SYMBICORT 160-4.5 MCG/ACT inhaler      No current facility-administered medications on file prior to visit.    BP 129/70 (BP Location: Right Arm, Patient Position: Sitting, Cuff Size: Large)   Pulse 74   Temp (!) 97.1 F (36.2 C) (Temporal)   Resp 16   Wt 207 lb (93.9 kg)   SpO2 100%   BMI 37.86 kg/m       Objective:   Physical Exam Constitutional:      Appearance: She is well-developed.  Neck:     Thyroid: No thyromegaly.  Cardiovascular:     Rate and Rhythm: Normal rate and regular rhythm.     Heart sounds: Normal heart sounds. No murmur.  Pulmonary:     Effort: Pulmonary effort is normal. No respiratory distress.     Breath sounds: Normal breath sounds. No wheezing.  Musculoskeletal:     Cervical back: Neck supple.  Skin:    General: Skin is warm and dry.  Neurological:     Mental Status: She is alert and oriented to person, place, and time.  Psychiatric:        Behavior: Behavior normal.        Thought Content: Thought content normal.        Judgment: Judgment normal.           Assessment & Plan:  HTN-  bp stable off of chlorthalidone.  Will continue to monitor.  Weight gain- I have asked her to keep a close eye on her weight and let me know if she has any further weight gain. We discussed that lexapro can sometimes be associated with weight gain.   Hypokalemia- obtain follow up bmet.   Depression- stable on lexapro, continue same.   This visit occurred during the SARS-CoV-2 public health emergency.  Safety protocols were in place, including screening questions prior to the visit, additional usage of staff PPE, and extensive cleaning of exam room while observing appropriate contact time as indicated for disinfecting solutions.

## 2019-09-04 ENCOUNTER — Encounter: Payer: Self-pay | Admitting: Family

## 2019-09-04 LAB — BASIC METABOLIC PANEL
BUN: 9 mg/dL (ref 6–23)
CO2: 29 mEq/L (ref 19–32)
Calcium: 9 mg/dL (ref 8.4–10.5)
Chloride: 104 mEq/L (ref 96–112)
Creatinine, Ser: 1.06 mg/dL (ref 0.40–1.20)
GFR: 54.87 mL/min — ABNORMAL LOW (ref >60.00)
Glucose, Bld: 72 mg/dL (ref 70–99)
Potassium: 3.8 mEq/L (ref 3.5–5.1)
Sodium: 141 mEq/L (ref 135–145)

## 2019-09-08 ENCOUNTER — Other Ambulatory Visit: Payer: Self-pay | Admitting: Family

## 2019-09-08 DIAGNOSIS — I10 Essential (primary) hypertension: Secondary | ICD-10-CM

## 2019-09-17 ENCOUNTER — Encounter: Payer: Self-pay | Admitting: Internal Medicine

## 2019-09-24 ENCOUNTER — Other Ambulatory Visit: Payer: Self-pay | Admitting: Family

## 2019-09-24 DIAGNOSIS — I10 Essential (primary) hypertension: Secondary | ICD-10-CM

## 2019-09-27 ENCOUNTER — Ambulatory Visit: Payer: 59

## 2019-10-11 ENCOUNTER — Other Ambulatory Visit: Payer: Self-pay

## 2019-10-11 ENCOUNTER — Ambulatory Visit (AMBULATORY_SURGERY_CENTER): Payer: Self-pay

## 2019-10-11 DIAGNOSIS — Z01818 Encounter for other preprocedural examination: Secondary | ICD-10-CM

## 2019-10-11 DIAGNOSIS — Z1211 Encounter for screening for malignant neoplasm of colon: Secondary | ICD-10-CM

## 2019-10-11 MED ORDER — SUTAB 1479-225-188 MG PO TABS: 12.0000 | ORAL_TABLET | ORAL | 0 refills | Status: DC

## 2019-10-11 NOTE — Progress Notes (Signed)
Patient's pre-visit was done today over the phone with the patient due to COVID-19 pandemic.   Name,DOB and address verified. Insurance verified. Packet of Prep instructions mailed to patient including copy of a consent form and pre-procedure patient acknowledgement form-pt is aware.  [employer] Coupon included. Patient understands to call us back with any questions or concerns.   COVID-19 screening test is on 10/23/19, the patient is aware.   Pt is aware that care partner will wait in the car during procedure; if they feel like they will be too hot or cold to wait in the car; they may wait in the 4 th floor lobby. Patient is aware to bring only one care partner. We want them to wear a mask (we do not have any that we can provide them), practice social distancing, and we will check their temperatures when they get here.  I did remind the patient that their care partner needs to stay in the parking lot the entire time and have a cell phone available, we will call them when the pt is ready for discharge. Patient will wear mask into building.

## 2019-10-22 ENCOUNTER — Encounter: Payer: Self-pay | Admitting: Internal Medicine

## 2019-10-24 ENCOUNTER — Other Ambulatory Visit: Payer: Self-pay | Admitting: Internal Medicine

## 2019-10-24 ENCOUNTER — Ambulatory Visit (INDEPENDENT_AMBULATORY_CARE_PROVIDER_SITE_OTHER): Payer: 59

## 2019-10-24 ENCOUNTER — Telehealth: Payer: Self-pay | Admitting: Internal Medicine

## 2019-10-24 DIAGNOSIS — Z1159 Encounter for screening for other viral diseases: Secondary | ICD-10-CM

## 2019-10-24 LAB — SARS CORONAVIRUS 2 (TAT 6-24 HRS): SARS Coronavirus 2: NEGATIVE

## 2019-10-24 NOTE — Telephone Encounter (Signed)
Pt is scheduled for a colon 10/25/19 and missed covid test.  Pt inquired whether she can get covid test done today.

## 2019-10-24 NOTE — Telephone Encounter (Signed)
Pt is scheduled for a colonoscopy with Dr. Henrene Pastor on 10/25/19. Pt stated that she missed her original covid test on 10/23/19. I rescheduled her to have her covid test done at 0920 toda, 10/24/19. Pt verbalized understanding, and she said she will head over to testing site now.

## 2019-10-25 ENCOUNTER — Encounter: Payer: Self-pay | Admitting: Internal Medicine

## 2019-10-25 ENCOUNTER — Other Ambulatory Visit: Payer: Self-pay

## 2019-10-25 ENCOUNTER — Ambulatory Visit (AMBULATORY_SURGERY_CENTER): Payer: 59 | Admitting: Internal Medicine

## 2019-10-25 VITALS — BP 147/85 | HR 80 | Temp 96.9°F | Resp 22 | Ht 62.0 in

## 2019-10-25 DIAGNOSIS — Z1211 Encounter for screening for malignant neoplasm of colon: Secondary | ICD-10-CM | POA: Diagnosis not present

## 2019-10-25 MED ORDER — SODIUM CHLORIDE 0.9 % IV SOLN: 500.0000 mL | INTRAVENOUS | Status: DC

## 2019-10-25 NOTE — Op Note (Addendum)
[facility] Patient Name: Brittany Johnson Procedure Date: 10/25/2019 2:27 PM MRN: [id number] Endoscopist: Docia Chuck. Henrene Pastor , MD Age: 49 Referring MD:  Date of Birth: 1971/05/24 Gender: Female Account #: [id number] Procedure:                Colonoscopy Indications:              Screening for colorectal malignant neoplasm Medicines:                Monitored Anesthesia Care Procedure:                Pre-Anesthesia Assessment:                           - Prior to the procedure, a History and Physical                            was performed, and patient medications and                            allergies were reviewed. The patient's tolerance of                            previous anesthesia was also reviewed. The risks                            and benefits of the procedure and the sedation                            options and risks were discussed with the patient.                            All questions were answered, and informed consent                            was obtained. Prior Anticoagulants: The patient has                            taken no previous anticoagulant or antiplatelet                            agents. ASA Grade Assessment: I - A normal, healthy                            patient. After reviewing the risks and benefits,                            the patient was deemed in satisfactory condition to                            undergo the procedure.                           After obtaining informed consent, the colonoscope  was passed under direct vision. Throughout the                            procedure, the patient's blood pressure, pulse, and                            oxygen saturations were monitored continuously. The                            Colonoscope was introduced through the anus and                            advanced to the the cecum, identified by                            appendiceal orifice and ileocecal  valve. The                            ileocecal valve, appendiceal orifice, and rectum                            were photographed. The quality of the bowel                            preparation was excellent. The colonoscopy was                            performed without difficulty. The patient tolerated                            the procedure well. The bowel preparation used was                            SUPREP via split dose instruction. Scope In: 2:44:46 PM Scope Out: [id number] PM Scope Withdrawal Time: 0 hours 10 minutes 11 seconds  Total Procedure Duration: 0 hours 11 minutes 56 seconds  Findings:                 The entire examined colon appeared normal on direct                            and retroflexion views. Complications:            No immediate complications. Estimated blood loss:                            None. Estimated Blood Loss:     Estimated blood loss: none. Impression:               - The entire examined colon is normal on direct and                            retroflexion views.                           - No specimens collected.  Recommendation:           - Repeat colonoscopy in 10 years for screening                            purposes.                           - Patient has a contact number available for                            emergencies. The signs and symptoms of potential                            delayed complications were discussed with the                            patient. Return to normal activities tomorrow.                            Written discharge instructions were provided to the                            patient.                           - Resume previous diet.                           - Continue present medications. Docia Chuck. Henrene Pastor, MD 10/25/2019 3:03:57 PM This report has been signed electronically.

## 2019-10-25 NOTE — Patient Instructions (Signed)
YOU HAD AN ENDOSCOPIC PROCEDURE TODAY AT THE [facility]:   Refer to the procedure report that was given to you for any specific questions about what was found during the examination.  If the procedure report does not answer your questions, please call your gastroenterologist to clarify.  If you requested that your care partner not be given the details of your procedure findings, then the procedure report has been included in a sealed envelope for you to review at your convenience later.  YOU SHOULD EXPECT: Some feelings of bloating in the abdomen. Passage of more gas than usual.  Walking can help get rid of the air that was put into your GI tract during the procedure and reduce the bloating. If you had a lower endoscopy (such as a colonoscopy or flexible sigmoidoscopy) you may notice spotting of blood in your stool or on the toilet paper. If you underwent a bowel prep for your procedure, you may not have a normal bowel movement for a few days.  Please Note:  You might notice some irritation and congestion in your nose or some drainage.  This is from the oxygen used during your procedure.  There is no need for concern and it should clear up in a day or so.  SYMPTOMS TO REPORT IMMEDIATELY:   Following lower endoscopy (colonoscopy or flexible sigmoidoscopy):  Excessive amounts of blood in the stool  Significant tenderness or worsening of abdominal pains  Swelling of the abdomen that is new, acute  Fever of 100F or higher  For urgent or emergent issues, a gastroenterologist can be reached at any hour by calling [phone]. Do not use MyChart messaging for urgent concerns.    DIET:  We do recommend a small meal at first, but then you may proceed to your regular diet.  Drink plenty of fluids but you should avoid alcoholic beverages for 24 hours.  ACTIVITY:  You should plan to take it easy for the rest of today and you should NOT DRIVE or use heavy machinery until tomorrow (because  of the sedation medicines used during the test).    FOLLOW UP: Our staff will call the number listed on your records 48-72 hours following your procedure to check on you and address any questions or concerns that you may have regarding the information given to you following your procedure. If we do not reach you, we will leave a message.  We will attempt to reach you two times.  During this call, we will ask if you have developed any symptoms of COVID 19. If you develop any symptoms (ie: fever, flu-like symptoms, shortness of breath, cough etc.) before then, please call [phone].  If you test positive for Covid 19 in the 2 weeks post procedure, please call and report this information to us.    If any biopsies were taken you will be contacted by phone or by letter within the next 1-3 weeks.  Please call us at [phone] if you have not heard about the biopsies in 3 weeks.    SIGNATURES/CONFIDENTIALITY: You and/or your care partner have signed paperwork which will be entered into your electronic medical record.  These signatures attest to the fact that that the information above on your After Visit Summary has been reviewed and is understood.  Full responsibility of the confidentiality of this discharge information lies with you and/or your care-partner. 

## 2019-10-25 NOTE — Progress Notes (Signed)
A/ox3, pleased with MAC, report to RN 

## 2019-10-25 NOTE — Progress Notes (Signed)
Temp JB V/s CW  

## 2019-10-29 ENCOUNTER — Telehealth: Payer: Self-pay | Admitting: *Deleted

## 2019-10-29 NOTE — Telephone Encounter (Signed)
  Follow up Call-  Call back number 10/25/2019  Post procedure Call Back phone  # [phone]  Permission to leave phone message Yes  Some recent data might be hidden     Patient questions:  Do you have a fever, pain , or abdominal swelling? No. Pain Score  0 *  Have you tolerated food without any problems? Yes.    Have you been able to return to your normal activities? Yes.    Do you have any questions about your discharge instructions: Diet   No. Medications  No. Follow up visit  No.  Do you have questions or concerns about your Care? No.  Actions: * If pain score is 4 or above: No action needed, pain <4.  1. Have you developed a fever since your procedure? no  2.   Have you had an respiratory symptoms (SOB or cough) since your procedure? no  3.   Have you tested positive for COVID 19 since your procedure no  4.   Have you had any family members/close contacts diagnosed with the COVID 19 since your procedure?  no   If yes to any of these questions please route to Joylene John, RN and Erenest Rasher, RN

## 2019-11-04 ENCOUNTER — Other Ambulatory Visit: Payer: Self-pay | Admitting: Nurse Practitioner

## 2019-11-04 DIAGNOSIS — B181 Chronic viral hepatitis B without delta-agent: Secondary | ICD-10-CM

## 2019-11-18 ENCOUNTER — Ambulatory Visit
Admission: RE | Admit: 2019-11-18 | Discharge: 2019-11-18 | Disposition: A | Discharge status: Home / Self Care | Payer: 59 | Source: Ambulatory Visit | Attending: Nurse Practitioner | Admitting: Nurse Practitioner

## 2019-11-18 ENCOUNTER — Other Ambulatory Visit: Payer: Self-pay | Admitting: Family

## 2019-11-18 DIAGNOSIS — B181 Chronic viral hepatitis B without delta-agent: Secondary | ICD-10-CM

## 2019-11-19 ENCOUNTER — Other Ambulatory Visit: Payer: Self-pay | Admitting: Family

## 2019-11-19 DIAGNOSIS — Z1231 Encounter for screening mammogram for malignant neoplasm of breast: Secondary | ICD-10-CM

## 2019-11-29 ENCOUNTER — Ambulatory Visit: Payer: 59 | Admitting: Certified Nurse Midwife

## 2019-12-13 ENCOUNTER — Ambulatory Visit: Payer: 59 | Admitting: Family

## 2019-12-20 ENCOUNTER — Ambulatory Visit: Payer: 59 | Admitting: Family

## 2019-12-20 DIAGNOSIS — Z0289 Encounter for other administrative examinations: Secondary | ICD-10-CM

## 2019-12-26 ENCOUNTER — Other Ambulatory Visit: Payer: Self-pay | Admitting: Internal Medicine

## 2019-12-27 ENCOUNTER — Ambulatory Visit: Payer: 59 | Admitting: Certified Nurse Midwife

## 2020-01-15 ENCOUNTER — Other Ambulatory Visit: Payer: Self-pay | Admitting: Internal Medicine

## 2020-02-12 ENCOUNTER — Other Ambulatory Visit: Payer: Self-pay | Admitting: Oncology

## 2020-02-12 ENCOUNTER — Encounter: Payer: Self-pay | Admitting: Oncology

## 2020-02-12 DIAGNOSIS — U071 COVID-19: Secondary | ICD-10-CM

## 2020-02-12 NOTE — Progress Notes (Signed)
I connected by phone with Brittany Johnson to discuss the potential use of an new treatment for mild to moderate COVID-19 viral infection in non-hospitalized patients.   This patient is a age/sex that meets the FDA criteria for Emergency Use Authorization of casirivimab\imdevimab.  Has a (+) direct SARS-CoV-2 viral test result 1. Has mild or moderate COVID-19  2. Is ? 50 years of age and weighs ? 40 kg 3. Is NOT hospitalized due to COVID-19 4. Is NOT requiring oxygen therapy or requiring an increase in baseline oxygen flow rate due to COVID-19 5. Is within 10 days of symptom onset 6. Has at least one of the high risk factor(s) for progression to severe COVID-19 and/or hospitalization as defined in EUA. ? Specific high risk criteria :obesity   Symptom onset 02/04/20   I have spoken and communicated the following to the patient or parent/caregiver:   1. FDA has authorized the emergency use of casirivimab\imdevimab for the treatment of mild to moderate COVID-19 in adults and pediatric patients with positive results of direct SARS-CoV-2 viral testing who are 6 years of age and older weighing at least 40 kg, and who are at high risk for progressing to severe COVID-19 and/or hospitalization.   2. The significant known and potential risks and benefits of casirivimab\imdevimab, and the extent to which such potential risks and benefits are unknown.   3. Information on available alternative treatments and the risks and benefits of those alternatives, including clinical trials.   4. Patients treated with casirivimab\imdevimab should continue to self-isolate and use infection control measures (e.g., wear mask, isolate, social distance, avoid sharing personal items, clean and disinfect "high touch" surfaces, and frequent handwashing) according to CDC guidelines.    5. The patient or parent/caregiver has the option to accept or refuse casirivimab\imdevimab .   After reviewing this information with the  patient, The patient agreed to proceed with receiving casirivimab\imdevimab infusion and will be provided a copy of the Fact sheet prior to receiving the infusion.Rulon Abide, AGNP-C [phone] ([place])

## 2020-02-13 ENCOUNTER — Ambulatory Visit (HOSPITAL_COMMUNITY)
Admission: RE | Admit: 2020-02-13 | Discharge: 2020-02-13 | Disposition: A | Discharge status: Home / Self Care | Payer: 59 | Source: Ambulatory Visit | Attending: Pulmonary Disease | Admitting: Pulmonary Disease

## 2020-02-13 DIAGNOSIS — U071 COVID-19: Secondary | ICD-10-CM | POA: Insufficient documentation

## 2020-02-13 MED ORDER — SODIUM CHLORIDE 0.9 % IV SOLN: INTRAVENOUS | Status: DC | PRN

## 2020-02-13 MED ORDER — DIPHENHYDRAMINE HCL 50 MG/ML IJ SOLN: 50.0000 mg | Freq: Once | INTRAMUSCULAR | Status: DC | PRN

## 2020-02-13 MED ORDER — SODIUM CHLORIDE 0.9 % IV SOLN
1200.0000 mg | Freq: Once | INTRAVENOUS | Status: AC
  Administered 2020-02-13: 1200 mg via INTRAVENOUS
  Filled 2020-02-13: qty 10

## 2020-02-13 MED ORDER — FAMOTIDINE IN NACL 20-0.9 MG/50ML-% IV SOLN: 20.0000 mg | Freq: Once | INTRAVENOUS | Status: DC | PRN

## 2020-02-13 MED ORDER — EPINEPHRINE 0.3 MG/0.3ML IJ SOAJ: 0.3000 mg | Freq: Once | INTRAMUSCULAR | Status: DC | PRN

## 2020-02-13 MED ORDER — METHYLPREDNISOLONE SODIUM SUCC 125 MG IJ SOLR: 125.0000 mg | Freq: Once | INTRAMUSCULAR | Status: DC | PRN

## 2020-02-13 MED ORDER — ALBUTEROL SULFATE HFA 108 (90 BASE) MCG/ACT IN AERS: 2.0000 | INHALATION_SPRAY | Freq: Once | RESPIRATORY_TRACT | Status: DC | PRN

## 2020-02-13 NOTE — Discharge Instructions (Signed)

## 2020-02-13 NOTE — Progress Notes (Signed)
Pt states she will take tylenol when she gets home. Diagnosis: COVID-19  Physician: Dr. Asencion Noble  Procedure: [facility] Med: casirivimab\imdevimab infusion - Provided patient with casirivimab\imdevimab fact sheet for patients, parents and caregivers prior to infusion.  Complications: No immediate complications noted.  Discharge: Discharged home   Gaye Alken 02/13/2020

## 2020-02-21 ENCOUNTER — Ambulatory Visit: Payer: 59 | Admitting: Family

## 2020-02-21 DIAGNOSIS — Z0289 Encounter for other administrative examinations: Secondary | ICD-10-CM

## 2020-03-11 ENCOUNTER — Other Ambulatory Visit: Payer: Self-pay | Admitting: Internal Medicine

## 2020-03-24 ENCOUNTER — Encounter: Payer: Self-pay | Admitting: Internal Medicine

## 2020-03-24 ENCOUNTER — Ambulatory Visit: Payer: 59 | Admitting: Internal Medicine

## 2020-03-24 VITALS — BP 124/72 | HR 60 | Ht 62.0 in | Wt 205.8 lb

## 2020-03-24 DIAGNOSIS — R059 Cough, unspecified: Secondary | ICD-10-CM

## 2020-03-24 DIAGNOSIS — K219 Gastro-esophageal reflux disease without esophagitis: Secondary | ICD-10-CM

## 2020-03-24 MED ORDER — PANTOPRAZOLE SODIUM 40 MG PO TBEC
40.0000 mg | DELAYED_RELEASE_TABLET | Freq: Every day | ORAL | 3 refills | Status: DC
Start: 2020-03-24 — End: 2020-05-22

## 2020-03-24 NOTE — Patient Instructions (Signed)
We have sent the following medications to your pharmacy for you to pick up at your convenience:  Pantoprazole  You have been scheduled for an endoscopy. Please follow written instructions given to you at your visit today. If you use inhalers (even only as needed), please bring them with you on the day of your procedure.   

## 2020-03-24 NOTE — Progress Notes (Signed)
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  Patient has had issues with allergies and cough dating back nearly 20 years.  For this complaint she underwent pulmonary evaluation with Dr. Melvyn Novas a 2020.  Reviewed.  She was given a number of agents including omeprazole 40 mg twice daily.  Is unclear, based on the patient's admission, how compliant she was with PPI therapy as prescribed.  She is continued with coughing problems.  Is worsened after she developed Covid related pneumonia late August 2021.  She also saw an allergist regarding ongoing chronic cough despite therapies.  Dr. Donneta Romberg suggested GI evaluation for possible GERD related cough.  Patient rarely has reflux symptoms.  Her cough is unrelated to meals.  She denies dysphagia.  Her cough is daily.  Dry.  Blood work from March 2021 shows normal basic metabolic panel.  She did undergo complete colonoscopy May 2021 for routine screening.  This was normal prior x-ray file review shows abdominal ultrasound November 18, 2019 revealed heterogeneous mucosa without solid lesion identified.  She is currently on Flonase nasal spray.  She has been taking Prilosec OTC 20 mg sporadically.  REVIEW OF SYSTEMS:  All non-GI ROS negative unless otherwise stated in the HPI except for back pain  Past Medical History:  Diagnosis Date  . Abnormal cervical cytology 02/07/2011   ?  Marland Kitchen Allergy   . Anxiety   . Bronchitis 05/25/2012  . Chicken pox as a child  . Fibroid   . GERD (gastroesophageal reflux disease)   . Hepatitis B   . Hx: UTI (urinary tract infection) 02/07/2011  . Hypertension    in the past  . Hypokalemia 05/25/2012  . Left lateral epicondylitis 09/02/2011  . Menorrhagia 11/24/2012  . Obese 03/04/2011  . Palpitations 07/08/2012   in the past  . Psoriasis 09/02/2011  . Right  shoulder strain 03/29/2012  . Shingles   . Tinea pedis of right foot 03/07/2011    Past Surgical History:  Procedure Laterality Date  . [place] OF UTERUS  2003   SAB  . EYE SURGERY    . INTRAUTERINE DEVICE (IUD) INSERTION     mirena inserted 05-16-16    Social History Brittany Johnson  reports that she has never smoked. She has never used smokeless tobacco. She reports that she does not drink alcohol and does not use drugs.  family history includes Cancer in her father and paternal grandmother; Diabetes in her mother; Heart attack in her father; Hepatitis in her mother; Hypertension in her father and mother.  No Known Allergies     PHYSICAL EXAMINATION: Vital signs: BP 124/72   Pulse 60   Ht 5\' 2"  (1.575 m)   Wt 205 lb 12.8 oz (93.4 kg)   BMI 37.64 kg/m   Constitutional: generally well-appearing, no acute distress Psychiatric: alert and oriented x3, cooperative Eyes: extraocular movements intact, anicteric, conjunctiva pink Mouth: oral pharynx moist, no lesions Neck: supple no lymphadenopathy Cardiovascular: heart regular rate and rhythm, no murmur Lungs: clear to auscultation bilaterally Abdomen: soft, nontender, nondistended, no obvious ascites, no peritoneal signs, normal bowel sounds, no organomegaly Rectal: Omitted Extremities: no clubbing, cyanosis, or lower extremity edema bilaterally Skin: no lesions on visible extremities Neuro: No focal deficits.  Cranial nerves intact  ASSESSMENT:  1.  Chronic cough.  Etiology unclear.  Worse  after Covid pneumonia August 2021 2.  Very rare GERD symptoms 3.  Long history of allergies 4.  Normal colonoscopy 2021   PLAN:  1.  Empiric trial of pantoprazole 40 mg daily.  Compliance stressed.  Proper timing of medication reviewed 2.  Reflux precautions with attention to weight loss 3.  Schedule diagnostic upper endoscopy to look for objective evidence of GERD.The nature of the procedure, as well as the risks,  benefits, and alternatives were carefully and thoroughly reviewed with the patient. Ample time for discussion and questions allowed. The patient understood, was satisfied, and agreed to proceed. 4.  If patient symptoms persist not obvious evidence for GERD as an etiology, consider pH testing off PPI therapy.  We discussed this 5.  Repeat screening colonoscopy around 2031

## 2020-03-25 ENCOUNTER — Ambulatory Visit (AMBULATORY_SURGERY_CENTER): Payer: 59 | Admitting: Internal Medicine

## 2020-03-25 ENCOUNTER — Encounter: Payer: Self-pay | Admitting: Internal Medicine

## 2020-03-25 ENCOUNTER — Other Ambulatory Visit: Payer: Self-pay

## 2020-03-25 VITALS — BP 128/70 | HR 66 | Temp 97.5°F | Resp 14 | Ht 62.0 in | Wt 205.0 lb

## 2020-03-25 DIAGNOSIS — R059 Cough, unspecified: Secondary | ICD-10-CM | POA: Diagnosis not present

## 2020-03-25 DIAGNOSIS — K219 Gastro-esophageal reflux disease without esophagitis: Secondary | ICD-10-CM

## 2020-03-25 MED ORDER — SODIUM CHLORIDE 0.9 % IV SOLN
500.0000 mL | Freq: Once | INTRAVENOUS | Status: DC
Start: 2020-03-25 — End: 2022-12-13

## 2020-03-25 NOTE — Op Note (Signed)
[facility] Patient Name: Brittany Johnson Procedure Date: 03/25/2020 10:52 AM MRN: [id number] Endoscopist: Docia Chuck. Henrene Pastor , MD Age: 49 Referring MD:  Date of Birth: 14-Nov-1970 Gender: Female Account #: [id number] Procedure:                Upper GI endoscopy Indications:              Chronic cough: Occasional classic reflux symptoms Medicines:                Monitored Anesthesia Care Procedure:                Pre-Anesthesia Assessment:                           - Prior to the procedure, a History and Physical                            was performed, and patient medications and                            allergies were reviewed. The patient's tolerance of                            previous anesthesia was also reviewed. The risks                            and benefits of the procedure and the sedation                            options and risks were discussed with the patient.                            All questions were answered, and informed consent                            was obtained. Prior Anticoagulants: The patient has                            taken no previous anticoagulant or antiplatelet                            agents. ASA Grade Assessment: II - A patient with                            mild systemic disease. After reviewing the risks                            and benefits, the patient was deemed in                            satisfactory condition to undergo the procedure.                           After obtaining informed consent, the endoscope was  passed under direct vision. Throughout the                            procedure, the patient's blood pressure, pulse, and                            oxygen saturations were monitored continuously. The                            Endoscope was introduced through the mouth, and                            advanced to the second part of duodenum. The upper                             GI endoscopy was accomplished without difficulty.                            The patient tolerated the procedure well. Scope In: Scope Out: Findings:                 The esophagus was normal.                           The stomach was normal.                           The examined duodenum was normal.                           The cardia and gastric fundus were normal on                            retroflexion. Complications:            No immediate complications. Estimated Blood Loss:     Estimated blood loss: none. Impression:               1. Normal EGD                           2. Chronic cough                           3. Occasional reflux symptoms. Recommendation:           1. Patient has a contact number available for                            emergencies. The signs and symptoms of potential                            delayed complications were discussed with the                            patient. Return to normal activities tomorrow.  Written discharge instructions were provided to the                            patient.                           2. Reflux precautions. Please provide patient with                            reflux precautions information.                           3. Continue pantoprazole 40 mg daily                           4. Office follow-up with Dr. Henrene Pastor in 6 weeks. Docia Chuck. Henrene Pastor, MD 03/25/2020 11:12:47 AM This report has been signed electronically.

## 2020-03-25 NOTE — Progress Notes (Signed)
Lidocaine 100mg IV given to blunt gag reflex 

## 2020-03-25 NOTE — Progress Notes (Signed)
C.W. vital signs. 

## 2020-03-25 NOTE — Progress Notes (Signed)
Pt's states no medical or surgical changes since previsit or office visit. 

## 2020-03-25 NOTE — Progress Notes (Signed)
Pt Drowsy. VSS. To PACU, report to RN. No anesthetic complications noted.  

## 2020-03-25 NOTE — Patient Instructions (Signed)
Start Reflux precautions  Continue pantoprazple 40mg  daily at least 30 min before you eat Follow up in office in 6 weeks with Dr. Henrene Pastor   YOU HAD AN ENDOSCOPIC PROCEDURE TODAY AT THE [facility]:   Refer to the procedure report that was given to you for any specific questions about what was found during the examination.  If the procedure report does not answer your questions, please call your gastroenterologist to clarify.  If you requested that your care partner not be given the details of your procedure findings, then the procedure report has been included in a sealed envelope for you to review at your convenience later.  YOU SHOULD EXPECT: Some feelings of bloating in the abdomen. Passage of more gas than usual.  Walking can help get rid of the air that was put into your GI tract during the procedure and reduce the bloating. If you had a lower endoscopy (such as a colonoscopy or flexible sigmoidoscopy) you may notice spotting of blood in your stool or on the toilet paper. If you underwent a bowel prep for your procedure, you may not have a normal bowel movement for a few days.  Please Note:  You might notice some irritation and congestion in your nose or some drainage.  This is from the oxygen used during your procedure.  There is no need for concern and it should clear up in a day or so.  SYMPTOMS TO REPORT IMMEDIATELY:    Following upper endoscopy (EGD)  Vomiting of blood or coffee ground material  New chest pain or pain under the shoulder blades  Painful or persistently difficult swallowing  New shortness of breath  Fever of 100F or higher  Black, tarry-looking stools  For urgent or emergent issues, a gastroenterologist can be reached at any hour by calling [phone]. Do not use MyChart messaging for urgent concerns.    DIET:  We do recommend a small meal at first, but then you may proceed to your regular diet.  Drink plenty of fluids but you should avoid alcoholic  beverages for 24 hours.  ACTIVITY:  You should plan to take it easy for the rest of today and you should NOT DRIVE or use heavy machinery until tomorrow (because of the sedation medicines used during the test).    FOLLOW UP: Our staff will call the number listed on your records 48-72 hours following your procedure to check on you and address any questions or concerns that you may have regarding the information given to you following your procedure. If we do not reach you, we will leave a message.  We will attempt to reach you two times.  During this call, we will ask if you have developed any symptoms of COVID 19. If you develop any symptoms (ie: fever, flu-like symptoms, shortness of breath, cough etc.) before then, please call [phone].  If you test positive for Covid 19 in the 2 weeks post procedure, please call and report this information to [place].    If any biopsies were taken you will be contacted by phone or by letter within the next 1-3 weeks.  Please call us at [phone] if you have not heard about the biopsies in 3 weeks.    SIGNATURES/CONFIDENTIALITY: You and/or your care partner have signed paperwork which will be entered into your electronic medical record.  These signatures attest to the fact that that the information above on your After Visit Summary has been reviewed and is understood.  Full  responsibility of the confidentiality of this discharge information lies with you and/or your care-partner.

## 2020-03-27 ENCOUNTER — Ambulatory Visit: Payer: 59

## 2020-03-27 ENCOUNTER — Telehealth: Payer: Self-pay | Admitting: *Deleted

## 2020-03-27 NOTE — Telephone Encounter (Signed)
  Follow up Call-  Call back number 03/25/2020 10/25/2019  Post procedure Call Back phone  # [phone]  Permission to leave phone message Yes Yes  Some recent data might be hidden     Patient questions:  Do you have a fever, pain , or abdominal swelling? No. Pain Score  0 *  Have you tolerated food without any problems? Yes.    Have you been able to return to your normal activities? Yes.    Do you have any questions about your discharge instructions: Diet   No. Medications  No. Follow up visit  No.  Do you have questions or concerns about your Care? No.  Actions: * If pain score is 4 or above: 1. No action needed, pain <4.Have you developed a fever since your procedure? no  2.   Have you had an respiratory symptoms (SOB or cough) since your procedure? no  3.   Have you tested positive for COVID 19 since your procedure no  4.   Have you had any family members/close contacts diagnosed with the COVID 19 since your procedure?  no   If yes to any of these questions please route to Joylene John, RN and Joella Prince, RN

## 2020-04-09 ENCOUNTER — Encounter: Payer: 59 | Admitting: Internal Medicine

## 2020-05-14 ENCOUNTER — Other Ambulatory Visit: Payer: Self-pay | Admitting: Nurse Practitioner

## 2020-05-14 DIAGNOSIS — B1910 Unspecified viral hepatitis B without hepatic coma: Secondary | ICD-10-CM

## 2020-05-15 ENCOUNTER — Ambulatory Visit: Payer: 59 | Admitting: Internal Medicine

## 2020-05-22 ENCOUNTER — Encounter: Payer: Self-pay | Admitting: Internal Medicine

## 2020-05-22 ENCOUNTER — Ambulatory Visit (INDEPENDENT_AMBULATORY_CARE_PROVIDER_SITE_OTHER): Payer: 59 | Admitting: Internal Medicine

## 2020-05-22 VITALS — BP 118/80 | HR 69 | Ht 62.0 in | Wt 204.0 lb

## 2020-05-22 DIAGNOSIS — K219 Gastro-esophageal reflux disease without esophagitis: Secondary | ICD-10-CM | POA: Diagnosis not present

## 2020-05-22 DIAGNOSIS — R059 Cough, unspecified: Secondary | ICD-10-CM | POA: Diagnosis not present

## 2020-05-22 MED ORDER — PANTOPRAZOLE SODIUM 40 MG PO TBEC: 40.0000 mg | DELAYED_RELEASE_TABLET | Freq: Every day | ORAL | 3 refills | Status: DC

## 2020-05-22 NOTE — Progress Notes (Signed)
HISTORY OF PRESENT ILLNESS:  Brittany Johnson is a 49 y.o. 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  She had dictation for details.  She did report very rare GERD symptoms.  Cough had been going on to various degrees over the past 20 years.  Worsened after Covid infection.  Upper endoscopy was performed March 25, 2000.  This was normal.  She was empirically placed on pantoprazole 40 mg daily and asked to follow-up at this time.  Patient's please report that her problems with daily cough have resolved.  She is taking pantoprazole 40 mg each day as instructed.  No appreciable medication side effects.  She is pleased.  REVIEW OF SYSTEMS:  All non-GI ROS negative.  Past Medical History:  Diagnosis Date  . Abnormal cervical cytology 02/07/2011   ?  Marland Kitchen Allergy   . Anxiety   . Bronchitis 05/25/2012  . Chicken pox as a child  . Fibroid   . GERD (gastroesophageal reflux disease)   . Hepatitis B   . Hx: UTI (urinary tract infection) 02/07/2011  . Hypertension    in the past  . Hypokalemia 05/25/2012  . Left lateral epicondylitis 09/02/2011  . Menorrhagia 11/24/2012  . Obese 03/04/2011  . Palpitations 07/08/2012   in the past  . Psoriasis 09/02/2011  . Right shoulder strain 03/29/2012  . Shingles   . Tinea pedis of right foot 03/07/2011    Past Surgical History:  Procedure Laterality Date  . DILATION AND CURETTAGE OF UTERUS  2003   SAB  . EYE SURGERY    . INTRAUTERINE DEVICE (IUD) INSERTION     mirena inserted 05-16-16    Social History Brittany Johnson  reports that she has never smoked. She has never used smokeless tobacco. She reports that she does not drink alcohol and does not use drugs.  family history includes Cancer in her father and paternal grandmother; Diabetes in her mother; Heart attack in her father; Hepatitis in her mother; Hypertension in her father and mother.  No Known Allergies     PHYSICAL  EXAMINATION: Vital signs: BP 118/80   Pulse 69   Ht 5\' 2"  (1.575 m)   Wt 204 lb (92.5 kg)   SpO2 97%   BMI 37.31 kg/m   Constitutional: generally well-appearing, no acute distress Psychiatric: alert and oriented x3, cooperative Eyes: Anicteric Mouth: Mask Abdomen: Not reexamined Neuro: Grossly intact  ASSESSMENT:  1.  Chronic cough.  Resolution after institution of PPI.  This would suggest GERD related cough. 2.  Normal EGD 3.  Normal colonoscopy 2021 4.  Long history of allergies.  On allergy shots twice per week   PLAN:  1.  Reflux precautions 2.  Continue pantoprazole 40 mg daily.  Prescription refilled 3.  Routine GI follow-up 1 year 4.  Repeat screening colonoscopy 2031

## 2020-05-22 NOTE — Patient Instructions (Signed)
If you are age 50 or older, your body mass index should be between 23-30. Your Body mass index is 37.31 kg/m. If this is out of the aforementioned range listed, please consider follow up with your Primary Care Provider.  If you are age 68 or younger, your body mass index should be between 19-25. Your Body mass index is 37.31 kg/m. If this is out of the aformentioned range listed, please consider follow up with your Primary Care Provider.   We have sent the following medications to your pharmacy for you to pick up at your convenience: Prontonix  We will see you in the office in 1 year.   Thank you for entrusting me with your care and choosing [facility].  Dr Henrene Pastor

## 2020-05-28 ENCOUNTER — Other Ambulatory Visit: Payer: 59

## 2020-06-19 ENCOUNTER — Other Ambulatory Visit: Payer: 59

## 2020-06-25 ENCOUNTER — Other Ambulatory Visit: Payer: Self-pay | Admitting: Family

## 2020-06-25 DIAGNOSIS — Z1231 Encounter for screening mammogram for malignant neoplasm of breast: Secondary | ICD-10-CM

## 2020-07-07 ENCOUNTER — Ambulatory Visit
Admission: RE | Admit: 2020-07-07 | Discharge: 2020-07-07 | Disposition: A | Discharge status: Home / Self Care | Payer: 59 | Source: Ambulatory Visit | Attending: Nurse Practitioner | Admitting: Nurse Practitioner

## 2020-07-07 DIAGNOSIS — B1910 Unspecified viral hepatitis B without hepatic coma: Secondary | ICD-10-CM

## 2020-07-08 ENCOUNTER — Ambulatory Visit
Admission: RE | Admit: 2020-07-08 | Discharge: 2020-07-08 | Disposition: A | Discharge status: Home / Self Care | Payer: 59 | Source: Ambulatory Visit

## 2020-07-08 ENCOUNTER — Other Ambulatory Visit: Payer: Self-pay

## 2020-07-08 DIAGNOSIS — Z1231 Encounter for screening mammogram for malignant neoplasm of breast: Secondary | ICD-10-CM

## 2020-07-09 ENCOUNTER — Other Ambulatory Visit: Payer: Self-pay | Admitting: Nurse Practitioner

## 2020-07-09 DIAGNOSIS — D376 Neoplasm of uncertain behavior of liver, gallbladder and bile ducts: Secondary | ICD-10-CM

## 2020-07-09 DIAGNOSIS — B181 Chronic viral hepatitis B without delta-agent: Secondary | ICD-10-CM

## 2020-07-28 ENCOUNTER — Ambulatory Visit
Admission: RE | Admit: 2020-07-28 | Discharge: 2020-07-28 | Disposition: A | Discharge status: Home / Self Care | Payer: 59 | Source: Ambulatory Visit | Attending: Nurse Practitioner | Admitting: Nurse Practitioner

## 2020-07-28 ENCOUNTER — Other Ambulatory Visit: Payer: Self-pay

## 2020-07-28 DIAGNOSIS — D376 Neoplasm of uncertain behavior of liver, gallbladder and bile ducts: Secondary | ICD-10-CM

## 2020-07-28 DIAGNOSIS — B181 Chronic viral hepatitis B without delta-agent: Secondary | ICD-10-CM

## 2020-07-28 MED ORDER — GADOXETATE DISODIUM 0.25 MMOL/ML IV SOLN
10.0000 mL | Freq: Once | INTRAVENOUS | Status: AC | PRN
  Administered 2020-07-28: 10 mL via INTRAVENOUS

## 2020-11-20 ENCOUNTER — Ambulatory Visit (HOSPITAL_BASED_OUTPATIENT_CLINIC_OR_DEPARTMENT_OTHER)
Admission: RE | Admit: 2020-11-20 | Discharge: 2020-11-20 | Disposition: A | Discharge status: Home / Self Care | Payer: 59 | Source: Ambulatory Visit | Attending: Family | Admitting: Family

## 2020-11-20 ENCOUNTER — Ambulatory Visit: Payer: 59 | Admitting: Family

## 2020-11-20 ENCOUNTER — Other Ambulatory Visit: Payer: Self-pay

## 2020-11-20 VITALS — BP 121/65 | HR 75 | Temp 98.5°F | Resp 16 | Ht 62.0 in | Wt 211.0 lb

## 2020-11-20 DIAGNOSIS — B181 Chronic viral hepatitis B without delta-agent: Secondary | ICD-10-CM

## 2020-11-20 DIAGNOSIS — J45991 Cough variant asthma: Secondary | ICD-10-CM | POA: Diagnosis not present

## 2020-11-20 DIAGNOSIS — M25561 Pain in right knee: Secondary | ICD-10-CM | POA: Diagnosis not present

## 2020-11-20 DIAGNOSIS — I10 Essential (primary) hypertension: Secondary | ICD-10-CM | POA: Diagnosis not present

## 2020-11-20 NOTE — Assessment & Plan Note (Signed)
She had an elevated reading at her hepatology office. Readings have been great here.  Will continue to monitor off of medications.  BP Readings from Last 3 Encounters:  11/20/20 121/65  05/22/20 118/80  03/25/20 128/70

## 2020-11-20 NOTE — Assessment & Plan Note (Signed)
She has been evaluated by pulmonology and GI.  Was doing allergy shots but she discontinued. She is on protonix 40mg  once daily.  Notes recent increase in cough, but she attributes this to the fact that her boss has been bringing her [place] into work.  Monitor.

## 2020-11-20 NOTE — Assessment & Plan Note (Signed)
She is following with hepatology.

## 2020-11-20 NOTE — Progress Notes (Signed)
Subjective:   By signing my name below, I, Shehryar Baig, attest that this documentation has been prepared under the direction and in the presence of [name] NP. 11/20/2020     Patient ID: Brittany Johnson, female    DOB: September 14, 1970, 49 y.o.   MRN: [id number]  Chief Complaint  Patient presents with   Blood Pressure Check    Patient reports BP was elevated at Hepatologist office.   Knee Pain    Complains of knee pain after injury back in April    HPI Patient is in today for a office visit. She complains of right knee pain while walking down the stairs for the past 2 months. She notes her pain started after she fell.  Allergy- She stopped taking her allergy injections a couple of months ago. Her allergies have recently flared up after her boss at work started bringing in their dog every day to work. Hypertension- Her blood pressure measured 152/82 while at her liver doctor. It is well managed during this visit. Diet- She started the optavia diet to help her lose weight. Her appetite has decreased since she started the optavia diet.   Health Maintenance Due  Topic Date Due   COVID-19 Vaccine (1) Never done   HIV Screening  Never done   Zoster Vaccines- Shingrix (1 of 2) Never done    Past Medical History:  Diagnosis Date   Abnormal cervical cytology 02/07/2011   ?   Allergy    Anxiety    Bronchitis 05/25/2012   Chicken pox as a child   Fibroid    GERD (gastroesophageal reflux disease)    Hepatitis B    Hx: UTI (urinary tract infection) 02/07/2011   Hypertension    in the past   Hypokalemia 05/25/2012   Left lateral epicondylitis 09/02/2011   Menorrhagia 11/24/2012   Obese 03/04/2011   Palpitations 07/08/2012   in the past   Psoriasis 09/02/2011   Right shoulder strain 03/29/2012   Shingles    Tinea pedis of right foot 03/07/2011    Past Surgical History:  Procedure Laterality Date   DILATION AND CURETTAGE OF UTERUS  2003   SAB   EYE SURGERY      INTRAUTERINE DEVICE (IUD) INSERTION     mirena inserted 05-16-16    Family History  Problem Relation Age of Onset   Diabetes Mother        type 2   Hypertension Mother    Hepatitis Mother        Hep B, died with liver issues   Heart attack Father        ? "infection on the outside of his heart"   Hypertension Father    Cancer Father        prostate   Cancer Paternal Grandmother        ?   Colon cancer Neg Hx    Colon polyps Neg Hx    Esophageal cancer Neg Hx    Stomach cancer Neg Hx    Rectal cancer Neg Hx     Social History   Socioeconomic History   Marital status: Married    Spouse name: Not on file   Number of children: Not on file   Years of education: Not on file   Highest education level: Not on file  Occupational History   Not on file  Tobacco Use   Smoking status: Never   Smokeless tobacco: Never  Vaping Use   Vaping Use: Never used  Substance and Sexual Activity   Alcohol use: No   Drug use: No   Sexual activity: Yes    Partners: Male    Birth control/protection: I.U.D.  Other Topics Concern   Not on file  Social History Narrative   Married for 23 yrs   2 step children- grown   2001 Son   2005 daughter   Works as an Scientist, water quality at [employer]    Completed bachelor's degree   Enjoys outdoor activities, golf, reading   Restaurant manager, fast food   Social Determinants of Radio broadcast assistant Strain: Not on [employer] Insecurity: Not on file  Transportation Needs: Not on file  Physical Activity: Not on file  Stress: Not on file  Social Connections: Not on file  Intimate Partner Violence: Not on file    Outpatient Medications Prior to Visit  Medication Sig Dispense Refill   betamethasone dipropionate (DIPROLENE) 0.05 % cream APPLY TO AFFECTED AREA TWICE A DAY 30 g 1   EPINEPHrine 0.3 mg/0.3 mL IJ SOAJ injection SMARTSIG:0.3 Milligram(s) IM Once PRN     fluticasone (FLONASE) 50 MCG/ACT nasal spray SPRAY 1-2 SPRAYS IN EACH NOSTRIL ONCE  A DAY     levocetirizine (XYZAL) 5 MG tablet every evening.     levonorgestrel (MIRENA) 20 MCG/24HR IUD 1 each by Intrauterine route once.     pantoprazole (PROTONIX) 40 MG tablet Take 1 tablet (40 mg total) by mouth daily. 90 tablet 3   SYMBICORT 160-4.5 MCG/ACT inhaler      Facility-Administered Medications Prior to Visit  Medication Dose Route Frequency Provider Last Rate Last Admin   0.9 %  sodium chloride infusion  500 mL Intravenous Once Irene Shipper, MD        No Known Allergies  Review of Systems  Musculoskeletal:  Positive for joint pain (Right knee).  Endo/Heme/Allergies:  Positive for environmental allergies.      Objective:    Physical Exam Constitutional:      General: She is not in acute distress.    Appearance: Normal appearance. She is not ill-appearing.  HENT:     Head: Normocephalic and atraumatic.     Right Ear: External ear normal.     Left Ear: External ear normal.  Eyes:     Extraocular Movements: Extraocular movements intact.     Pupils: Pupils are equal, round, and reactive to light.  Cardiovascular:     Rate and Rhythm: Normal rate and regular rhythm.     Pulses: Normal pulses.     Heart sounds: Normal heart sounds. No murmur heard.   No gallop.  Pulmonary:     Effort: Pulmonary effort is normal. No respiratory distress.     Breath sounds: Normal breath sounds. No wheezing, rhonchi or rales.  Musculoskeletal:     Right knee: No swelling. Tenderness (Tenderness to patellar palpation) present.  Skin:    General: Skin is warm and dry.  Neurological:     Mental Status: She is alert and oriented to person, place, and time.  Psychiatric:        Behavior: Behavior normal.    BP 121/65 (BP Location: Right Arm, Patient Position: Sitting, Cuff Size: Large)   Pulse 75   Temp 98.5 F (36.9 C) (Oral)   Resp 16   Ht 5\' 2"  (1.575 m)   Wt 211 lb (95.7 kg)   SpO2 98%   BMI 38.59 kg/m  Wt Readings from Last 3 Encounters:  11/20/20 211 lb (95.7  kg)   05/22/20 204 lb (92.5 kg)  03/25/20 205 lb (93 kg)       Assessment & Plan:   Problem List Items Addressed This Visit       Unprioritized   HTN (hypertension)    She had an elevated reading at her hepatology office. Readings have been great here.  Will continue to monitor off of medications.  BP Readings from Last 3 Encounters:  11/20/20 121/65  05/22/20 118/80  03/25/20 128/70         Hepatitis B carrier [facility])    She is following with hepatology.        Cough variant asthma vs UACS     She has been evaluated by pulmonology and GI.  Was doing allergy shots but she discontinued. She is on protonix 40mg  once daily.  Notes recent increase in cough, but she attributes this to the fact that her boss has been bringing her [place] into work.  Monitor.         Acute pain of right knee - Primary   Relevant Orders   DG Knee 3 Views Right   Ambulatory referral to Sports Medicine     No orders of the defined types were placed in this encounter.   I, [name] NP, personally preformed the services described in this documentation.  All medical record entries made by the scribe were at my direction and in my presence.  I have reviewed the chart and discharge instructions (if applicable) and agree that the record reflects my personal performance and is accurate and complete. 11/20/2020   I,Shehryar Baig,acting as a Education administrator for Nance Pear, NP.,have documented all relevant documentation on the behalf of Nance Pear, NP,as directed by  Nance Pear, NP while in the presence of Nance Pear, NP.   Nance Pear, NP

## 2020-11-20 NOTE — Patient Instructions (Signed)
Please complete lab work prior to leaving.   

## 2021-02-02 ENCOUNTER — Encounter: Payer: Self-pay | Admitting: Allergy and Immunology

## 2021-02-02 ENCOUNTER — Other Ambulatory Visit: Payer: Self-pay

## 2021-02-02 ENCOUNTER — Ambulatory Visit: Payer: 59 | Admitting: Allergy and Immunology

## 2021-02-02 VITALS — BP 124/80 | HR 72 | Temp 98.4°F | Resp 16 | Ht 62.0 in | Wt 205.2 lb

## 2021-02-02 DIAGNOSIS — J301 Allergic rhinitis due to pollen: Secondary | ICD-10-CM | POA: Diagnosis not present

## 2021-02-02 DIAGNOSIS — J3089 Other allergic rhinitis: Secondary | ICD-10-CM

## 2021-02-02 DIAGNOSIS — K219 Gastro-esophageal reflux disease without esophagitis: Secondary | ICD-10-CM | POA: Diagnosis not present

## 2021-02-02 NOTE — Progress Notes (Signed)
[place] [facility] [place]   Dear [name],  Thank you for referring Brittany Johnson to the [facility] on 02/02/2021.   Below is a summation of this patient's evaluation and recommendations.  Thank you for your referral. I will keep you informed about this patient's response to treatment.   If you have any questions please do not hesitate to contact me.   Sincerely,  Jiles Prows, MD Allergy / Immunology [place]   ______________________________________________________________________    NEW PATIENT NOTE  Referring Provider: [name], NP Primary Provider: [name], NP Date of office visit: 02/02/2021    Subjective:   Chief Complaint:  Brittany Johnson (DOB: 04/13/1971) is a 50 y.o. female who presents to the clinic on 02/02/2021 with a chief complaint of Establish Care (Wants to go back on allergy shots) .     HPI: Brittany Johnson presents to this clinic in evaluation of allergies.  Apparently she has been receiving a course of immunotherapy for the past year but she has had some difficulty working through the logistics of having this performed and she has basically not been using immunotherapy since December 2021.  It sounds as though she was administered immunotherapy because of "cough".  That is her major complaint.  She does not have a tremendous amount of nasal congestion or sneezing or itchy eyes or itchy nose.  She was given a prescription of Flonase and Singulair which she does not use.  She was given a prescription for Xyzal which he uses on a pretty consistent basis.  Interestingly, she has been off her Xyzal for the past 3 days and cannot really tell much difference regarding her respiratory tract symptoms other than the fact that she may have had some itchy eyes when she was outdoors the past week.  Apparently she  was evaluated by a pulmonologist and given a short acting bronchodilator which she does not use.  She does not have any cold air induced bronchospastic symptoms or exercise-induced bronchospastic symptoms.  She does have reflux.  And her cough is slightly better since she started pantoprazole 1 time per day.  She has lots of throat clearing and a tickle stuck in her throat and postnasal drip.  She coughs much more after eating.  She has 2 coffees in the morning and tea twice a day and has chocolate about 1 time per week and does not drink any alcohol.  Past Medical History:  Diagnosis Date   Abnormal cervical cytology 02/07/2011   ?   Allergy    Anxiety    Bronchitis 05/25/2012   Chicken pox as a child   Fibroid    GERD (gastroesophageal reflux disease)    Hepatitis B    Hx: UTI (urinary tract infection) 02/07/2011   Hypertension    in the past   Hypokalemia 05/25/2012   Left lateral epicondylitis 09/02/2011   Menorrhagia 11/24/2012   Obese 03/04/2011   Palpitations 07/08/2012   in the past   Psoriasis 09/02/2011   Right shoulder strain 03/29/2012   Shingles    Tinea pedis of right foot 03/07/2011    Past Surgical History:  Procedure Laterality Date   DILATION AND CURETTAGE OF UTERUS  2003   SAB   EYE SURGERY     INTRAUTERINE DEVICE (IUD) INSERTION     mirena inserted 05-16-16    Allergies as of 02/02/2021  No Known Allergies      Medication List    betamethasone dipropionate 0.05 % cream APPLY TO AFFECTED AREA TWICE A DAY   EPINEPHrine 0.3 mg/0.3 mL Soaj injection Commonly known as: EPI-PEN SMARTSIG:0.3 Milligram(s) IM Once PRN   fluticasone 50 MCG/ACT nasal spray Commonly known as: FLONASE SPRAY 1-2 SPRAYS IN EACH NOSTRIL ONCE A DAY   levocetirizine 5 MG tablet Commonly known as: XYZAL every evening.   levonorgestrel 20 MCG/24HR IUD Commonly known as: MIRENA 1 each by Intrauterine route once.   pantoprazole 40 MG tablet Commonly known as: PROTONIX Take 1  tablet (40 mg total) by mouth daily.   Symbicort 160-4.5 MCG/ACT inhaler Generic drug: budesonide-formoterol Inhale 2 puffs into the lungs in the morning and at bedtime.    Review of systems negative except as noted in HPI / PMHx or noted below:  Review of Systems  Constitutional: Negative.   HENT: Negative.    Eyes: Negative.   Respiratory: Negative.    Cardiovascular: Negative.   Gastrointestinal: Negative.   Genitourinary: Negative.   Musculoskeletal: Negative.   Skin: Negative.   Neurological: Negative.   Endo/Heme/Allergies: Negative.   Psychiatric/Behavioral: Negative.     Family History  Problem Relation Age of Onset   Diabetes Mother        type 2   Hypertension Mother    Hepatitis Mother        Hep B, died with liver issues   Heart attack Father        ? "infection on the outside of his heart"   Hypertension Father    Cancer Father        prostate   Cancer Paternal Grandmother        ?   Colon cancer Neg Hx    Colon polyps Neg Hx    Esophageal cancer Neg Hx    Stomach cancer Neg Hx    Rectal cancer Neg Hx     Social History   Socioeconomic History   Marital status: Married    Spouse name: Not on file   Number of children: Not on file   Years of education: Not on file   Highest education level: Not on file  Occupational History   Not on file  Tobacco Use   Smoking status: Never   Smokeless tobacco: Never  Vaping Use   Vaping Use: Never used  Substance and Sexual Activity   Alcohol use: No   Drug use: No   Sexual activity: Yes    Partners: Male    Birth control/protection: I.U.D.  Other Topics Concern   Not on file  Social History Narrative   Married for 23 yrs   2 step children- grown   2001 Son   2005 daughter   Works as an Scientist, water quality at [employer]    Completed bachelor's degree   Enjoys outdoor activities, golf, reading   IT trainer and Social history  Lives in a house with a dry environment,  dog located inside the household, carpet in the bedroom, no plastic on the bed, no plastic on the pillow, no smoking ongoing with inside the household.  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  Objective:   Vitals:   02/02/21 0952  BP: 124/80  Pulse: 72  Resp: 16  Temp: 98.4 F (36.9 C)  SpO2: 97%   Height: '5\' 2"'$  (157.5 cm) Weight: 205 lb 4 oz (93.1 kg)  Physical Exam Constitutional:  Appearance: She is not diaphoretic.     Comments: Throat clearing, slight cough  HENT:     Head: Normocephalic.     Right Ear: Tympanic membrane, ear canal and external ear normal.     Left Ear: Tympanic membrane, ear canal and external ear normal.     Nose: Nose normal. No mucosal edema or rhinorrhea.     Mouth/Throat:     Pharynx: Uvula midline. No oropharyngeal exudate.  Eyes:     Conjunctiva/sclera: Conjunctivae normal.  Neck:     Thyroid: No thyromegaly.     Trachea: Trachea normal. No tracheal tenderness or tracheal deviation.  Cardiovascular:     Rate and Rhythm: Normal rate and regular rhythm.     Heart sounds: Normal heart sounds, S1 normal and S2 normal. No murmur heard. Pulmonary:     Effort: No respiratory distress.     Breath sounds: Normal breath sounds. No stridor. No wheezing or rales.  Lymphadenopathy:     Head:     Right side of head: No tonsillar adenopathy.     Left side of head: No tonsillar adenopathy.     Cervical: No cervical adenopathy.  Skin:    Findings: No erythema or rash.     Nails: There is no clubbing.  Neurological:     Mental Status: She is alert.    Diagnostics: Allergy skin tests were performed.  She demonstrated hypersensitivity to house dust mite, cat, trees, grasses, weeds, mold  Spirometry was performed and demonstrated an FEV1 of 3.0 @ 116 % of predicted. FEV1/FVC = 0.92  Results of a chest x-ray obtained 05 December 2017 identified the following:  Mediastinum hilar structures normal. Lungs are clear. No pleural effusion or  pneumothorax. Heart size normal. No acute bony abnormality.   Assessment and Plan:    1. Perennial allergic rhinitis   2. Seasonal allergic rhinitis due to pollen   3. LPRD (laryngopharyngeal reflux disease)     1.  Allergen avoidance measures - dust mite, cat, pollen, mold  2.  Treat and prevent LPR:  A. Minimize caffeine consumption B. Pantoprazole 40 mg - 1 tablet 2 times per day C. Replace throat clearing with swallowing maneuver  3. If needed:  A. Xyzal 5 mg - 1 tablet 2 times per day  4. Return to clinic in 6 weeks.   5. Immunotherapy???  6. Plan for fall flu vaccine  Although Mady does have some degree of atopic disease that is probably producing some inflammation of her airway I think the overriding issue giving rise to her "cough" is LPR and were going to focus our attention on the treatment of this condition with the therapy noted above.  I have given her some allergen avoidance measures to follow regarding specific aeroallergens and she can continue on Xyzal for her atopic component of her respiratory tract disease.  I will regroup with her in 6 weeks to make an assessment of her response to this approach and consider further evaluation and treatment based upon her response.  Jiles Prows, MD Allergy / Immunology [facility]

## 2021-02-02 NOTE — Patient Instructions (Addendum)
  1.  Allergen avoidance measures - dust mite, cat, pollen, mold  2.  Treat and prevent LPR:  A. Minimize caffeine consumption B. Pantoprazole 40 mg - 1 tablet 2 times per day C. Replace throat clearing with swallowing maneuver  3. If needed:  A. Xyzal 5 mg - 1 tablet 2 times per day  4. Return to clinic in 6 weeks.   5. Immunotherapy???  6. Plan for fall flu vaccine

## 2021-02-03 ENCOUNTER — Encounter: Payer: Self-pay | Admitting: Allergy and Immunology

## 2021-02-03 ENCOUNTER — Other Ambulatory Visit: Payer: Self-pay | Admitting: Allergy and Immunology

## 2021-02-03 MED ORDER — LEVOCETIRIZINE DIHYDROCHLORIDE 5 MG PO TABS: 5.0000 mg | ORAL_TABLET | Freq: Two times a day (BID) | ORAL | 5 refills | Status: DC | PRN

## 2021-02-03 MED ORDER — PANTOPRAZOLE SODIUM 40 MG PO TBEC: 40.0000 mg | DELAYED_RELEASE_TABLET | Freq: Two times a day (BID) | ORAL | 3 refills | Status: DC

## 2021-02-05 NOTE — Addendum Note (Signed)
Addended by: Guy Franco on: 02/05/2021 09:33 AM   Modules accepted: Orders

## 2021-03-02 ENCOUNTER — Encounter: Payer: Self-pay | Admitting: Family

## 2021-03-02 ENCOUNTER — Ambulatory Visit: Payer: 59 | Admitting: Family

## 2021-03-02 ENCOUNTER — Other Ambulatory Visit: Payer: Self-pay

## 2021-03-02 VITALS — BP 122/80 | HR 63 | Temp 97.6°F | Ht 62.0 in | Wt 201.8 lb

## 2021-03-02 DIAGNOSIS — Z23 Encounter for immunization: Secondary | ICD-10-CM

## 2021-03-02 DIAGNOSIS — M25561 Pain in right knee: Secondary | ICD-10-CM

## 2021-03-02 DIAGNOSIS — F418 Other specified anxiety disorders: Secondary | ICD-10-CM | POA: Diagnosis not present

## 2021-03-02 DIAGNOSIS — K219 Gastro-esophageal reflux disease without esophagitis: Secondary | ICD-10-CM | POA: Diagnosis not present

## 2021-03-02 DIAGNOSIS — I10 Essential (primary) hypertension: Secondary | ICD-10-CM

## 2021-03-02 MED ORDER — ESCITALOPRAM OXALATE 10 MG PO TABS: ORAL_TABLET | ORAL | 0 refills | Status: DC

## 2021-03-02 NOTE — Assessment & Plan Note (Signed)
Stable on protonix 40mg.  Continue same.  

## 2021-03-02 NOTE — Patient Instructions (Signed)
Please start lexapro 1/2 tab once daily for 1 week, then increase to a full tab once daily on week two.

## 2021-03-02 NOTE — Assessment & Plan Note (Signed)
Uncontrolled.  Will restart lexapro 10mg .

## 2021-03-02 NOTE — Assessment & Plan Note (Signed)
BP Readings from Last 3 Encounters:  03/02/21 122/80  02/02/21 124/80  11/20/20 121/65   BP is stable without medication at this time. Monitor.

## 2021-03-02 NOTE — Progress Notes (Signed)
Subjective:   By signing my name below, I, Lyric Barr-McArthur, attest that this documentation has been prepared under the direction and in the presence of [name], NP, 03/02/2021   Patient ID: Brittany Johnson, female    DOB: [birth year], 50 y.o.   MRN: [id number]  Chief Complaint  Patient presents with   Medical Management of Chronic Issues    3 m f/u     HPI Patient is in today for an office visit.   She had previously had knee issues  Lexapro: During her last visit she was prescribed 10 mg Lexapro to help with her depression and anxiety symptoms.She claims that she was taking it on and off and when she went to go get a refill it had expired. She did feel better when she was on it. She complains of an increase level of stress, depression and anxiety and she is interested in starting Lexapro again. Other Medications: She is compliant with taking her symbicort inhaler and her 40 mg protonix.  Knee Issue: She had previously complained of a knee issue but notes that it has resolved on its own.  Vaccinations: She is interested in receiving her flu shot during this visit. She is due for her tetanus shot but will receive that at her next appointment. Declines covid vaccination.   Health Maintenance Due  Topic Date Due   COVID-19 Vaccine (1) Never done   HIV Screening  Never done   Zoster Vaccines- Shingrix (1 of 2) Never done   INFLUENZA VACCINE  01/11/2021    Past Medical History:  Diagnosis Date   Abnormal cervical cytology 02/07/2011   ?   Allergy    Anxiety    Bronchitis 05/25/2012   Chicken pox as a child   Fibroid    GERD (gastroesophageal reflux disease)    Hepatitis B    Hx: UTI (urinary tract infection) 02/07/2011   Hypertension    in the past   Hypokalemia 05/25/2012   Left lateral epicondylitis 09/02/2011   Menorrhagia 11/24/2012   Obese 03/04/2011   Palpitations 07/08/2012   in the past   Psoriasis 09/02/2011   Right shoulder strain 03/29/2012    Shingles    Tinea pedis of right foot 03/07/2011    Past Surgical History:  Procedure Laterality Date   DILATION AND CURETTAGE OF UTERUS  2003   SAB   EYE SURGERY     INTRAUTERINE DEVICE (IUD) INSERTION     mirena inserted 05-16-16    Family History  Problem Relation Age of Onset   Diabetes Mother        type 2   Hypertension Mother    Hepatitis Mother        Hep B, died with liver issues   Heart attack Father        ? "infection on the outside of his heart"   Hypertension Father    Cancer Father        prostate   Cancer Paternal Grandmother        ?   Colon cancer Neg Hx    Colon polyps Neg Hx    Esophageal cancer Neg Hx    Stomach cancer Neg Hx    Rectal cancer Neg Hx     Social History   Socioeconomic History   Marital status: Married    Spouse name: Not on file   Number of children: Not on file   Years of education: Not on file   Highest education level: Not  on file  Occupational History   Not on file  Tobacco Use   Smoking status: Never   Smokeless tobacco: Never  Vaping Use   Vaping Use: Never used  Substance and Sexual Activity   Alcohol use: No   Drug use: No   Sexual activity: Yes    Partners: Male    Birth control/protection: I.U.D.  Other Topics Concern   Not on file  Social History Narrative   Married for 23 yrs   2 step children- grown   2001 Son   2005 daughter   Works as an Scientist, water quality at [employer]    Completed bachelor's degree   Enjoys outdoor activities, golf, reading   Restaurant manager, fast food   Social Determinants of Radio broadcast assistant Strain: Not on [employer] Insecurity: Not on file  Transportation Needs: Not on file  Physical Activity: Not on file  Stress: Not on file  Social Connections: Not on file  Intimate Partner Violence: Not on file    Outpatient Medications Prior to Visit  Medication Sig Dispense Refill   betamethasone dipropionate (DIPROLENE) 0.05 % cream APPLY TO AFFECTED AREA TWICE A DAY 30 g  1   EPINEPHrine 0.3 mg/0.3 mL IJ SOAJ injection SMARTSIG:0.3 Milligram(s) IM Once PRN     fluticasone (FLONASE) 50 MCG/ACT nasal spray SPRAY 1-2 SPRAYS IN EACH NOSTRIL ONCE A DAY     levocetirizine (XYZAL) 5 MG tablet TAKE 1 TABLET (5 MG TOTAL) BY MOUTH 2 (TWO) TIMES DAILY AS NEEDED FOR ALLERGIES. 60 tablet 5   levonorgestrel (MIRENA) 20 MCG/24HR IUD 1 each by Intrauterine route once.     pantoprazole (PROTONIX) 40 MG tablet Take 1 tablet (40 mg total) by mouth 2 (two) times daily. 90 tablet 3   SYMBICORT 160-4.5 MCG/ACT inhaler Inhale 2 puffs into the lungs in the morning and at bedtime.     Facility-Administered Medications Prior to Visit  Medication Dose Route Frequency Provider Last Rate Last Admin   0.9 %  sodium chloride infusion  500 mL Intravenous Once Irene Shipper, MD        No Known Allergies  Review of Systems  Psychiatric/Behavioral:  Positive for depression. The patient is nervous/anxious.       Objective:    Physical Exam Constitutional:      General: She is not in acute distress.    Appearance: Normal appearance. She is not ill-appearing.  HENT:     Head: Normocephalic and atraumatic.     Right Ear: External ear normal.     Left Ear: External ear normal.  Eyes:     Extraocular Movements: Extraocular movements intact.     Pupils: Pupils are equal, round, and reactive to light.  Cardiovascular:     Rate and Rhythm: Normal rate and regular rhythm.     Heart sounds: Normal heart sounds. No murmur heard.   No gallop.  Pulmonary:     Effort: Pulmonary effort is normal. No respiratory distress.     Breath sounds: Normal breath sounds. No wheezing or rales.  Skin:    General: Skin is warm and dry.  Neurological:     Mental Status: She is alert and oriented to person, place, and time.  Psychiatric:        Behavior: Behavior normal.        Judgment: Judgment normal.    BP 122/80   Pulse 63   Temp 97.6 F (36.4 C) (Oral)   Ht 5\' 2"  (1.575  m)   Wt 201 lb 12.8  oz (91.5 kg)   SpO2 98%   BMI 36.91 kg/m  Wt Readings from Last 3 Encounters:  03/02/21 201 lb 12.8 oz (91.5 kg)  02/02/21 205 lb 4 oz (93.1 kg)  11/20/20 211 lb (95.7 kg)       Assessment & Plan:   Problem List Items Addressed This Visit       Unprioritized   HTN (hypertension)    BP Readings from Last 3 Encounters:  03/02/21 122/80  02/02/21 124/80  11/20/20 121/65  BP is stable without medication at this time. Monitor.       GERD    Stable on protonix 40mg  Continue same.       Depression with anxiety    Uncontrolled.  Will restart lexapro 10mg .        Relevant Medications   escitalopram (LEXAPRO) 10 MG tablet   Acute pain of right knee    Resolved.         Meds ordered this encounter  Medications   escitalopram (LEXAPRO) 10 MG tablet    Sig: 1/2 tab by mouth once daily for 1 week, then increase to a full tab on week two    Dispense:  30 tablet    Refill:  0    Order Specific Question:   Supervising Provider    Answer:   Penni Homans A [[id number]]   Flu shot today.   I, [name], NP, personally preformed the services described in this documentation.  All medical record entries made by the scribe were at my direction and in my presence.  I have reviewed the chart and discharge instructions (if applicable) and agree that the record reflects my personal performance and is accurate and complete. 03/02/2021   I,Lyric Barr-McArthur,acting as a Education administrator for Nance Pear, NP.,have documented all relevant documentation on the behalf of Nance Pear, NP,as directed by  Nance Pear, NP while in the presence of Nance Pear, NP.    Nance Pear, NP

## 2021-03-02 NOTE — Assessment & Plan Note (Signed)
Resolved

## 2021-03-03 ENCOUNTER — Ambulatory Visit (INDEPENDENT_AMBULATORY_CARE_PROVIDER_SITE_OTHER): Payer: 59 | Admitting: Obstetrics & Gynecology

## 2021-03-03 ENCOUNTER — Encounter: Payer: Self-pay | Admitting: Obstetrics & Gynecology

## 2021-03-03 ENCOUNTER — Other Ambulatory Visit (HOSPITAL_COMMUNITY)
Admission: RE | Admit: 2021-03-03 | Discharge: 2021-03-03 | Disposition: A | Discharge status: Home / Self Care | Payer: 59 | Source: Ambulatory Visit | Attending: Obstetrics & Gynecology | Admitting: Obstetrics & Gynecology

## 2021-03-03 VITALS — BP 120/78 | HR 78 | Resp 18 | Ht 62.0 in | Wt 201.4 lb

## 2021-03-03 DIAGNOSIS — Z01419 Encounter for gynecological examination (general) (routine) without abnormal findings: Secondary | ICD-10-CM

## 2021-03-03 DIAGNOSIS — Z30431 Encounter for routine checking of intrauterine contraceptive device: Secondary | ICD-10-CM | POA: Diagnosis not present

## 2021-03-03 DIAGNOSIS — E6609 Other obesity due to excess calories: Secondary | ICD-10-CM

## 2021-03-03 DIAGNOSIS — N921 Excessive and frequent menstruation with irregular cycle: Secondary | ICD-10-CM | POA: Diagnosis not present

## 2021-03-03 DIAGNOSIS — Z975 Presence of (intrauterine) contraceptive device: Secondary | ICD-10-CM

## 2021-03-03 DIAGNOSIS — B181 Chronic viral hepatitis B without delta-agent: Secondary | ICD-10-CM

## 2021-03-03 DIAGNOSIS — Z6836 Body mass index (BMI) 36.0-36.9, adult: Secondary | ICD-10-CM

## 2021-03-03 NOTE — Progress Notes (Signed)
Brittany Johnson 1970-12-11 [id number]   History:    50 y.o. [id number] Married  RP:  Established patient presenting for annual gyn exam   HPI: Contraception Mirena IUD x 2017.  BTB worsening with light red/brown bleeding most days.  No pelvic pain.  No pain with IC, but not frequently sexually active.  Previous paps normal, last in 2020 was Neg with Neg HPV HR.  Breasts normal.  Mammo 06/2020.  BMI 36.84. Improving fitness and nutrition.  Health labs with Fam MD.  Continues with Hepatitis follow up as recommended.   Past medical history,surgical history, family history and social history were all reviewed and documented in the EPIC chart.  Gynecologic History Patient's last menstrual period was 03/03/2021.  Obstetric History OB History  Gravida Para Term Preterm AB Living  4 2     2 2   SAB IAB Ectopic Multiple Live Births  2       2    # Outcome Date GA Lbr Len/2nd Weight Sex Delivery Anes PTL Lv  4 SAB           3 SAB           2 Para     F Vag-Spont   LIV  1 Para     M Vag-Spont   LIV     ROS: A ROS was performed and pertinent positives and negatives are included in the history.  GENERAL: No fevers or chills. HEENT: No change in vision, no earache, sore throat or sinus congestion. NECK: No pain or stiffness. CARDIOVASCULAR: No chest pain or pressure. No palpitations. PULMONARY: No shortness of breath, cough or wheeze. GASTROINTESTINAL: No abdominal pain, nausea, vomiting or diarrhea, melena or bright red blood per rectum. GENITOURINARY: No urinary frequency, urgency, hesitancy or dysuria. MUSCULOSKELETAL: No joint or muscle pain, no back pain, no recent trauma. DERMATOLOGIC: No rash, no itching, no lesions. ENDOCRINE: No polyuria, polydipsia, no heat or cold intolerance. No recent change in weight. HEMATOLOGICAL: No anemia or easy bruising or bleeding. NEUROLOGIC: No headache, seizures, numbness, tingling or weakness. PSYCHIATRIC: No depression, no loss of interest in normal  activity or change in sleep pattern.     Exam:   BP 120/78   Pulse 78   Resp 18   Ht 5\' 2"  (1.575 m)   Wt 201 lb 6.4 oz (91.4 kg)   LMP 03/03/2021 Comment: IUD, cycles irregular  BMI 36.84 kg/m   Body mass index is 36.84 kg/m.  General appearance : Well developed well nourished female. No acute distress HEENT: Eyes: no retinal hemorrhage or exudates,  Neck supple, trachea midline, no carotid bruits, no thyroidmegaly Lungs: Clear to auscultation, no rhonchi or wheezes, or rib retractions  Heart: Regular rate and rhythm, no murmurs or gallops Breast:Examined in sitting and supine position were symmetrical in appearance, no palpable masses or tenderness,  no skin retraction, no nipple inversion, no nipple discharge, no skin discoloration, no axillary or supraclavicular lymphadenopathy Abdomen: no palpable masses or tenderness, no rebound or guarding Extremities: no edema or skin discoloration or tenderness  Pelvic: Vulva: Normal             Vagina: No gross lesions or discharge  Cervix: No gross lesions or discharge.  IUD strings present at [facility].  Pap reflex done.  Uterus  AV, normal size, shape and consistency, non-tender and mobile  Adnexa  Without masses or tenderness  Anus: Normal   Assessment/Plan:  50 y.o. female for annual exam   1.  Encounter for routine gynecological examination with Papanicolaou smear of cervix Normal gynecologic exam.  Pap reflex done.  Breast exam normal.  Screening mammogram January 2022 was negative.  Colonoscopy 2021.  Health labs with family physician.   - Cytology - PAP( [place])  2. Encounter for routine checking of intrauterine contraceptive device (IUD) Mirena IUD since 2017.  Well on it and in good position, but not developing more frequent breakthrough bleeding.  Decision to proceed with a pelvic ultrasound to evaluate the endometrial lining.  We will remove the Mirena IUD and insert a new one at that visit per pelvic ultrasound  results. - IUD removal; Future - IUD Insertion; Future  3. Breakthrough bleeding associated with intrauterine device (IUD) Breakthrough bleeding worsening.  Normal gynecologic exam today.  We will further investigate with a pelvic ultrasound to rule out endometrial pathology. - US Transvaginal Non-OB; Future - Cytology - PAP( [place]) - IUD removal; Future - IUD Insertion; Future  4. Hepatitis B carrier ([place])   5. Class 2 obesity due to excess calories without serious comorbidity with body mass index (BMI) of 36.0 to 36.9 in adult   Princess Bruins MD, 8:32 AM 03/03/2021

## 2021-03-07 IMAGING — MR MR ABDOMEN WO/W CM MRCP
9 of 19 series · 23 of 48 positions shown · IV contrast (10 ml eovist)
Comparison: 07/07/2020 abdominal ultrasound.

CLINICAL DATA: Hepatitis-B. Ultrasound demonstrating 2 liver
lesions, including at 4.2 and 3.2 cm.

EXAM:
MRI ABDOMEN WITHOUT AND WITH CONTRAST (INCLUDING MRCP)
TECHNIQUE: Multiplanar multisequence MR imaging of the abdomen was performed
both before and after the administration of intravenous contrast.
Heavily T2-weighted images of the biliary and pancreatic ducts were
obtained, and three-dimensional MRCP images were rendered by post
processing.
CONTRAST:  10mL EOVIST GADOXETATE DISODIUM 0.25 MOL/L IV SOLN

[Series 3: cor haste · coronal · 5.0mm · 0.78mm/px · 2 of 37 slices shown]
[im 1/37]
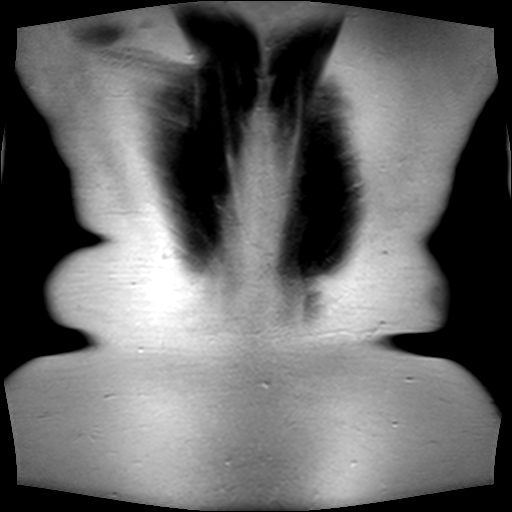
[im 37/37]
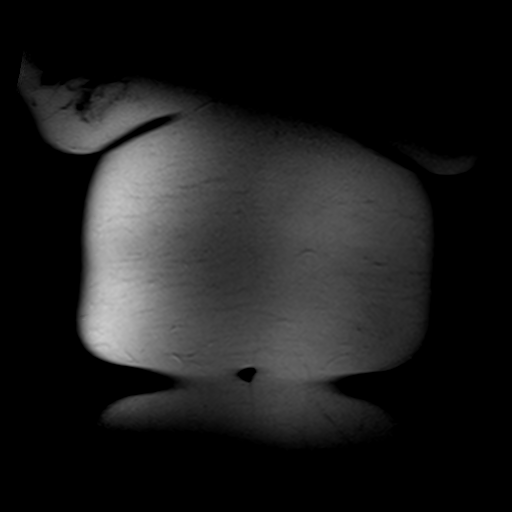

[Series 4: axial haste · axial · 5.0mm · 0.80mm/px · z∈[-109,+149]mm · 2 of 44 slices shown]
[im 1/44]
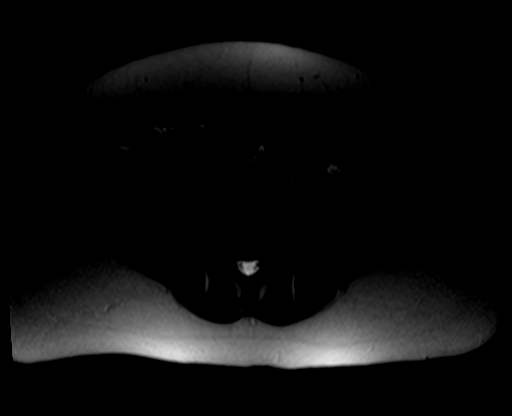
[im 44/44]
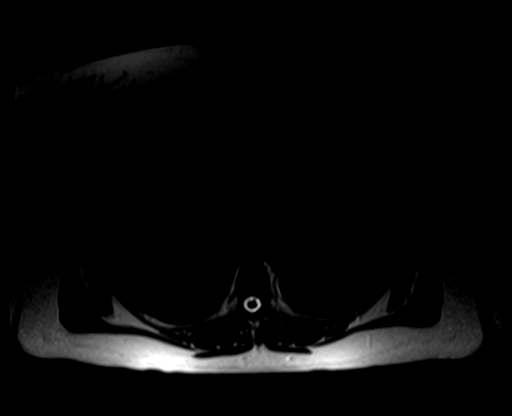

[Series 5: bSSFP · axial · 5.0mm · 0.80mm/px · z∈[-72,+138]mm · 2 of 43 slices shown]
[im 1/43]
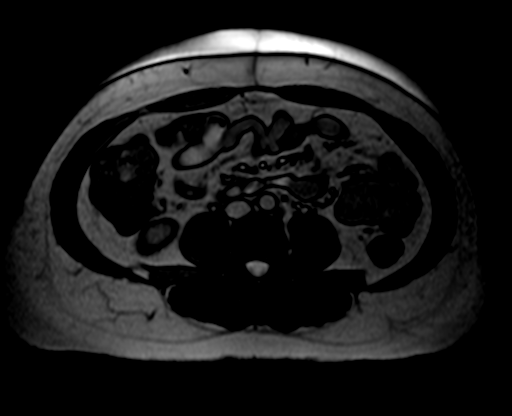
[im 43/43]
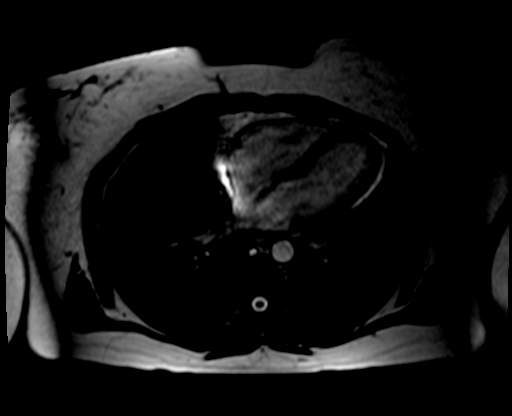

[Series 6: T1 · axial · 5.0mm · 0.80mm/px · z∈[-96,+162]mm · 3 of 88 slices shown]
[im 1/88]
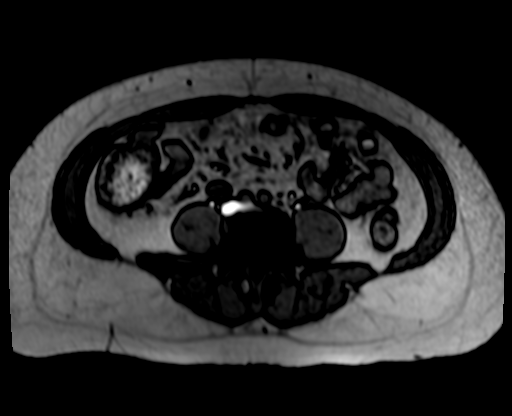
[im 44/88]
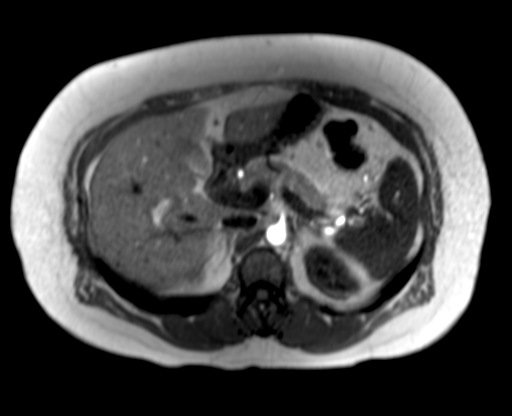
[im 88/88]
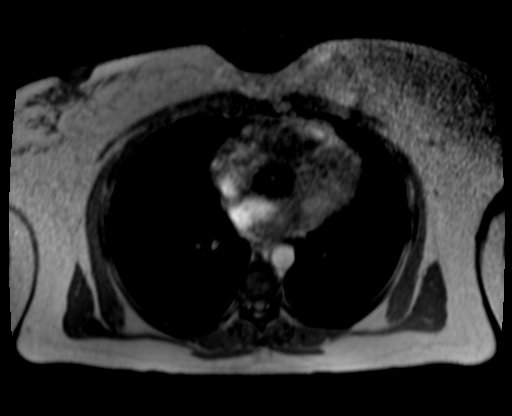

[Series 11: T1 dynamic · axial · non-contrast · 2.7mm · 0.80mm/px · z∈[-91,+165]mm · 3 of 96 slices shown]
[im 1/96]
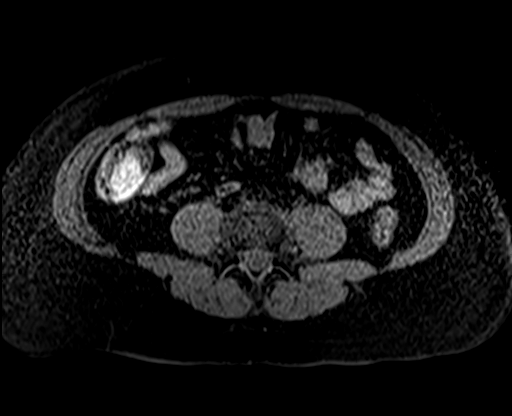
[im 48/96]
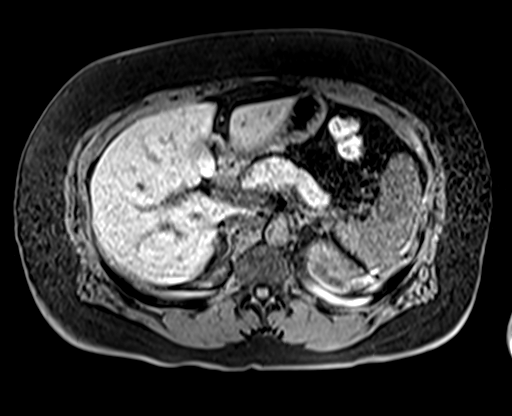
[im 96/96]
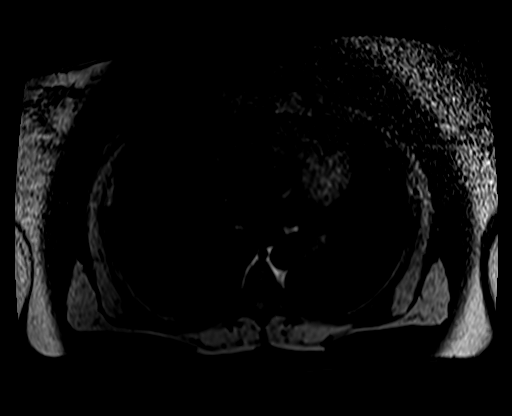

[Series 12: T1 dynamic post-contrast · axial · 2.7mm · 0.80mm/px · z∈[-91,+165]mm · 3 of 96 slices shown (1 of 4)]
[im 1/96]
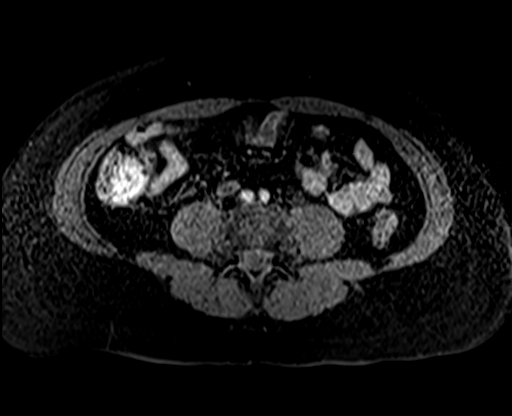
[im 48/96]
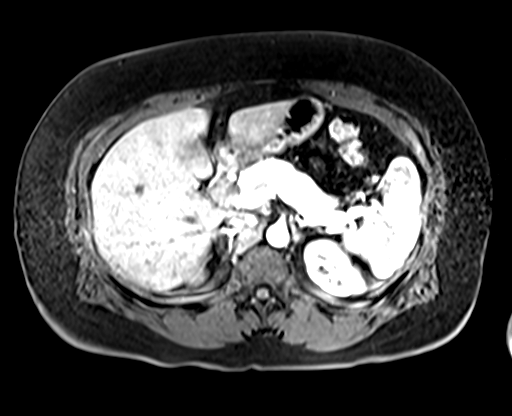
[im 96/96]
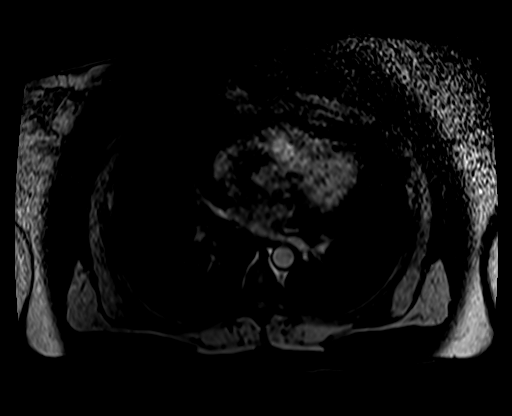

[Series 13: T1 dynamic post-contrast · axial · 2.7mm · 0.80mm/px · z∈[-91,+165]mm · 3 of 96 slices shown (2 of 4)]
[im 1/96]
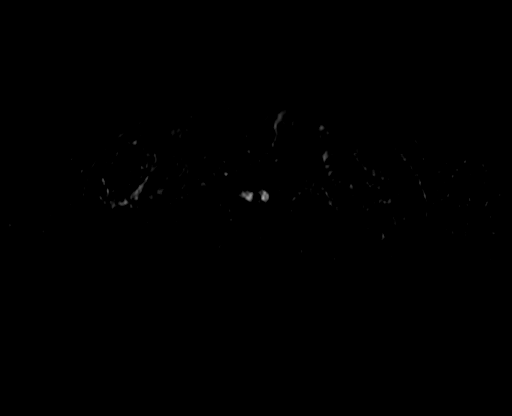
[im 48/96]
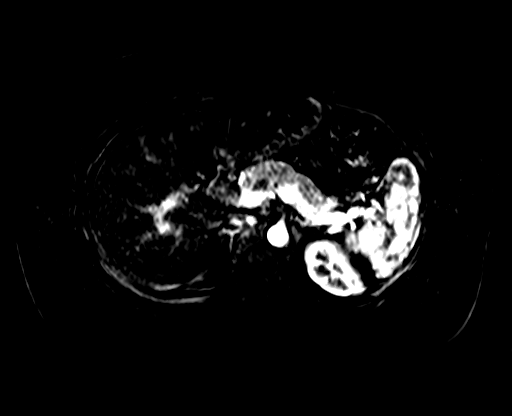
[im 96/96]
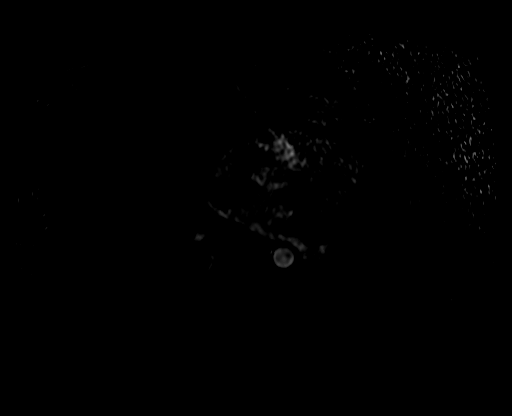

[Series 14: T1 dynamic post-contrast · axial · 2.7mm · 0.80mm/px · z∈[-91,+165]mm · 3 of 96 slices shown (3 of 4)]
[im 1/96]
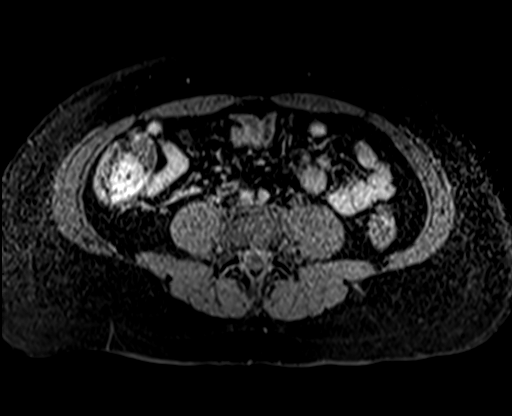
[im 48/96]
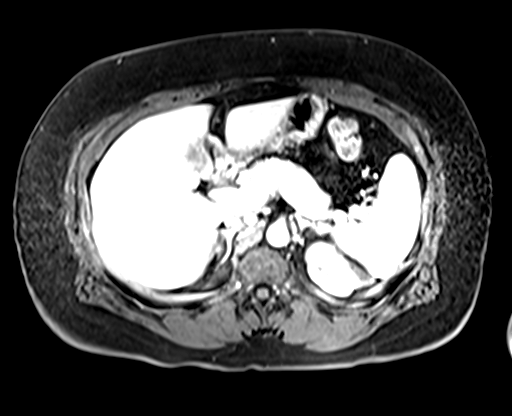
[im 96/96]
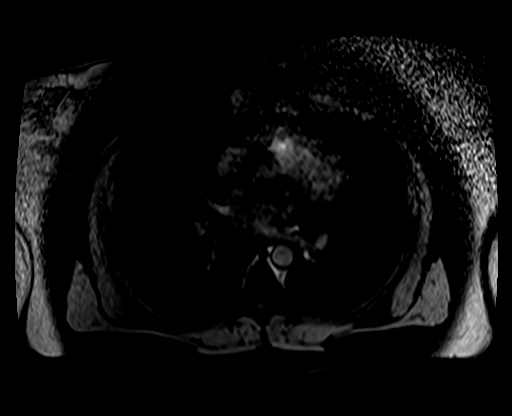

[Series 15: T1 dynamic post-contrast · axial · 2.7mm · 0.80mm/px · z∈[-91,+36]mm · 2 of 96 slices shown (4 of 4)]
[im 1/96]
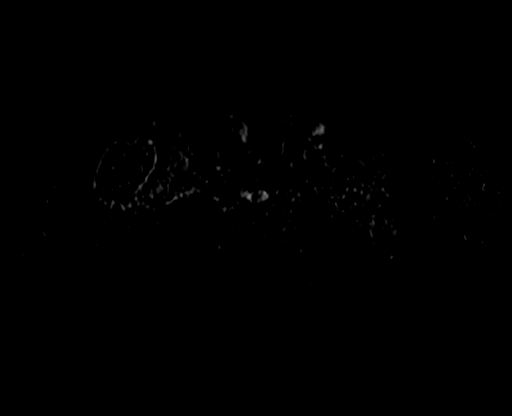
[im 48/96]
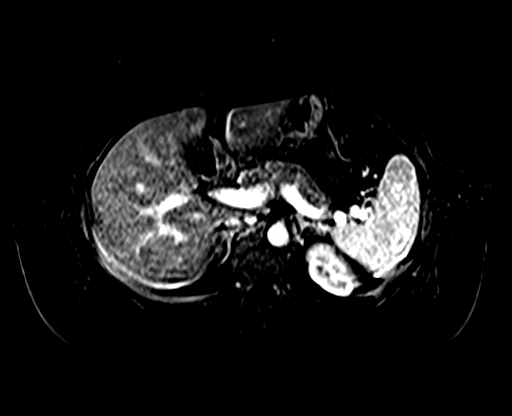

[23 of 48 positions shown; findings below may reference images not displayed]

FINDINGS: Lower chest: Normal heart size without pericardial or pleural
effusion. Tiny hiatal hernia.

Hepatobiliary: No cirrhosis. Innumerable tiny T2 hyperintense liver
lesions, consistent with bile duct hamartomas. No suspicious liver
lesion. Normal gallbladder, without biliary ductal dilatation. No
obstructive stone or mass.

Pancreas:  Normal, without mass or ductal dilatation.

Spleen:  Normal in size, without focal abnormality.

Adrenals/Urinary Tract: Normal adrenal glands. Bilateral too small
to characterize renal lesions. Bilateral renal cysts, including a
3.0 cm interpolar left renal cyst. No suspicious renal lesion. No
hydronephrosis.

Stomach/Bowel: Normal abdominal bowel loops.

Vascular/Lymphatic: Aortic atherosclerosis. No retroperitoneal or
retrocrural adenopathy.

Other:  No ascites.

Musculoskeletal: No acute osseous abnormality.
IMPRESSION: 1. No evidence of cirrhosis or suspicious liver lesion.
2. Innumerable tiny bile duct hamartomas.
3.  Tiny hiatal hernia.
4.  Aortic and branch vessel atherosclerosis.

## 2021-03-08 LAB — CYTOLOGY - PAP
Comment: NEGATIVE
Diagnosis: UNDETERMINED — AB
High risk HPV: NEGATIVE

## 2021-03-09 ENCOUNTER — Ambulatory Visit: Payer: 59 | Admitting: Obstetrics & Gynecology

## 2021-03-17 ENCOUNTER — Other Ambulatory Visit: Payer: Self-pay | Admitting: Nurse Practitioner

## 2021-03-17 DIAGNOSIS — B181 Chronic viral hepatitis B without delta-agent: Secondary | ICD-10-CM

## 2021-03-17 DIAGNOSIS — K7400 Hepatic fibrosis, unspecified: Secondary | ICD-10-CM

## 2021-03-17 DIAGNOSIS — K760 Fatty (change of) liver, not elsewhere classified: Secondary | ICD-10-CM

## 2021-03-19 ENCOUNTER — Ambulatory Visit: Payer: 59 | Admitting: Family Medicine

## 2021-03-19 DIAGNOSIS — J309 Allergic rhinitis, unspecified: Secondary | ICD-10-CM

## 2021-03-26 ENCOUNTER — Other Ambulatory Visit: Payer: 59

## 2021-03-27 ENCOUNTER — Other Ambulatory Visit: Payer: Self-pay | Admitting: Family

## 2021-03-29 NOTE — Telephone Encounter (Signed)
Patient to keep appointment in 2 days to evaluate before refill.

## 2021-03-31 ENCOUNTER — Ambulatory Visit (INDEPENDENT_AMBULATORY_CARE_PROVIDER_SITE_OTHER): Payer: 59 | Admitting: Family

## 2021-03-31 ENCOUNTER — Other Ambulatory Visit: Payer: Self-pay

## 2021-03-31 ENCOUNTER — Encounter: Payer: Self-pay | Admitting: Family

## 2021-03-31 ENCOUNTER — Telehealth: Payer: Self-pay | Admitting: Family

## 2021-03-31 VITALS — BP 145/79 | HR 66 | Temp 98.3°F | Resp 18 | Ht 62.0 in | Wt 199.6 lb

## 2021-03-31 DIAGNOSIS — F418 Other specified anxiety disorders: Secondary | ICD-10-CM

## 2021-03-31 DIAGNOSIS — E781 Pure hyperglyceridemia: Secondary | ICD-10-CM

## 2021-03-31 DIAGNOSIS — E876 Hypokalemia: Secondary | ICD-10-CM | POA: Diagnosis not present

## 2021-03-31 DIAGNOSIS — Z0001 Encounter for general adult medical examination with abnormal findings: Secondary | ICD-10-CM

## 2021-03-31 DIAGNOSIS — Z0000 Encounter for general adult medical examination without abnormal findings: Secondary | ICD-10-CM

## 2021-03-31 DIAGNOSIS — I10 Essential (primary) hypertension: Secondary | ICD-10-CM | POA: Diagnosis not present

## 2021-03-31 DIAGNOSIS — R32 Unspecified urinary incontinence: Secondary | ICD-10-CM | POA: Diagnosis not present

## 2021-03-31 DIAGNOSIS — Z23 Encounter for immunization: Secondary | ICD-10-CM

## 2021-03-31 LAB — COMPREHENSIVE METABOLIC PANEL
ALT: 20 U/L (ref 0–35)
AST: 19 U/L (ref 0–37)
Albumin: 4.5 g/dL (ref 3.5–5.2)
Alkaline Phosphatase: 57 U/L (ref 39–117)
BUN: 19 mg/dL (ref 6–23)
CO2: 31 mEq/L (ref 19–32)
Calcium: 10.1 mg/dL (ref 8.4–10.5)
Chloride: 103 mEq/L (ref 96–112)
Creatinine, Ser: 1.29 mg/dL — ABNORMAL HIGH (ref 0.40–1.20)
GFR: 48.03 mL/min — ABNORMAL LOW (ref >60.00)
Glucose, Bld: 93 mg/dL (ref 70–99)
Potassium: 4.6 mEq/L (ref 3.5–5.1)
Sodium: 140 mEq/L (ref 135–145)
Total Bilirubin: 0.7 mg/dL (ref 0.2–1.2)
Total Protein: 7.5 g/dL (ref 6.0–8.3)

## 2021-03-31 LAB — LIPID PANEL
Cholesterol: 171 mg/dL (ref 0–200)
HDL: 45.6 mg/dL (ref >39.00)
LDL Cholesterol: 101 mg/dL — ABNORMAL HIGH (ref 0–99)
NonHDL: 125.23
Total CHOL/HDL Ratio: 4
Triglycerides: 120 mg/dL (ref 0.0–149.0)
VLDL: 24 mg/dL (ref 0.0–40.0)

## 2021-03-31 LAB — TSH: TSH: 3.59 u[IU]/mL (ref 0.35–5.50)

## 2021-03-31 NOTE — Patient Instructions (Signed)
Please complete lab work prior to leaving.  Continue your work on healthy diet, exercise and weight loss.  

## 2021-03-31 NOTE — Assessment & Plan Note (Signed)
New. Will refer to Urogynecology.

## 2021-03-31 NOTE — Progress Notes (Addendum)
Subjective:   By signing my name below, I, Shehryar Baig, attest that this documentation has been prepared under the direction and in the presence of [name] NP. 03/31/2021    Patient ID: Brittany Johnson, female    DOB: 11/26/1970, 50 y.o.   MRN: [id number]  Chief Complaint  Patient presents with   Annual Exam    Doing well , no complaints    HPI Patient is in today for a comprehensive physical exam.  Mood- During last visit she restarted 10 mg lexapro daily PO and reports doing well while taking it.  Leaking- She reports having mild occasional urinary leakage. She is wearing padding to help catch the leakage. She is interested in speaking to her GYN specialist Dr. Dellis Filbert to manage her symptoms.  Thyroid- She is interested in checking her thyroid during her lab work during this visit. She reports that she is fasting this morning.  Social history- She has an IUD for birth control. She does not use alcohol, does not use drugs, does not use tobacco or vaping products. She has no recent changes to her family medical history. She has no recent surgery for the past year.  She denies having any unexpected weight change, ear pain, hearing loss and rhinorrhea, visual disturbance, cough, chest pain and leg swelling, nausea, vomiting, diarrhea, constipation, blood in stool, or dysuria and frequency, for myalgias and arthralgias, rash, headaches, adenopathy, depression or anxiety at this time.  Immunizations: She is UTD on flu vaccines. She is due for tetanus vaccines and is interested in getting it. She does not have any Covid-19 vaccines and is not interested in getting them. She is due for shingles vaccines and is interested in getting it.  Diet: She is improving her healthy diet. She reports she is under 200 lb's since changing her diet.  Exercise: She is trying to increase her exercise by walking more  Colonoscopy: Last completed 10/25/2019. Results are normal. Repeat in 10 years.   Pap Smear: Last completed 03/03/2021. Results showed atypical squamous cells of undetermined significance, otherwise results are normal. Repeat in 1 year.  Mammogram: Last completed 07/08/2020. Results are normal. Repeat in 1 year.  Dental: Vision:  Health Maintenance Due  Topic Date Due   COVID-19 Vaccine (1) Never done   HIV Screening  Never done    Past Medical History:  Diagnosis Date   Abnormal cervical cytology 02/07/2011   ?   Allergy    Anxiety    Bronchitis 05/25/2012   Chicken pox as a child   Fibroid    GERD (gastroesophageal reflux disease)    Hepatitis B    Hx: UTI (urinary tract infection) 02/07/2011   Hypertension    in the past   Hypokalemia 05/25/2012   Left lateral epicondylitis 09/02/2011   Menorrhagia 11/24/2012   Obese 03/04/2011   Palpitations 07/08/2012   in the past   Psoriasis 09/02/2011   Right shoulder strain 03/29/2012   Shingles    Tinea pedis of right foot 03/07/2011    Past Surgical History:  Procedure Laterality Date   DILATION AND CURETTAGE OF UTERUS  2003   SAB   EYE SURGERY     INTRAUTERINE DEVICE (IUD) INSERTION     mirena inserted 05-16-16    Family History  Problem Relation Age of Onset   Diabetes Mother        type 2   Hypertension Mother    Hepatitis Mother        Hep B,  died with liver issues   Heart attack Father        ? "infection on the outside of his heart"   Hypertension Father    Cancer Father        prostate   Cancer Paternal Grandmother        ?   Colon cancer Neg Hx    Colon polyps Neg Hx    Esophageal cancer Neg Hx    Stomach cancer Neg Hx    Rectal cancer Neg Hx     Social History   Socioeconomic History   Marital status: Married    Spouse name: Not on file   Number of children: Not on file   Years of education: Not on file   Highest education level: Not on file  Occupational History   Not on file  Tobacco Use   Smoking status: Never   Smokeless tobacco: Never  Vaping Use   Vaping Use:  Never used  Substance and Sexual Activity   Alcohol use: No   Drug use: No   Sexual activity: Yes    Partners: Male    Birth control/protection: I.U.D.  Other Topics Concern   Not on file  Social History Narrative   Married for 23 yrs   2 step children- grown   2001 Son   2005 daughter   Works as an Scientist, water quality at [employer]    Completed bachelor's degree   Enjoys outdoor activities, golf, reading   Restaurant manager, fast food   Social Determinants of Radio broadcast assistant Strain: Not on [employer] Insecurity: Not on file  Transportation Needs: Not on file  Physical Activity: Not on file  Stress: Not on file  Social Connections: Not on file  Intimate Partner Violence: Not on file    Outpatient Medications Prior to Visit  Medication Sig Dispense Refill   betamethasone dipropionate (DIPROLENE) 0.05 % cream APPLY TO AFFECTED AREA TWICE A DAY 30 g 1   EPINEPHrine 0.3 mg/0.3 mL IJ SOAJ injection SMARTSIG:0.3 Milligram(s) IM Once PRN     escitalopram (LEXAPRO) 10 MG tablet 1/2 tab by mouth once daily for 1 week, then increase to a full tab on week two 30 tablet 0   fluticasone (FLONASE) 50 MCG/ACT nasal spray SPRAY 1-2 SPRAYS IN EACH NOSTRIL ONCE A DAY     levocetirizine (XYZAL) 5 MG tablet TAKE 1 TABLET (5 MG TOTAL) BY MOUTH 2 (TWO) TIMES DAILY AS NEEDED FOR ALLERGIES. 60 tablet 5   levonorgestrel (MIRENA) 20 MCG/24HR IUD 1 each by Intrauterine route once.     pantoprazole (PROTONIX) 40 MG tablet Take 1 tablet (40 mg total) by mouth 2 (two) times daily. 90 tablet 3   SYMBICORT 160-4.5 MCG/ACT inhaler Inhale 2 puffs into the lungs in the morning and at bedtime.     Facility-Administered Medications Prior to Visit  Medication Dose Route Frequency Provider Last Rate Last Admin   0.9 %  sodium chloride infusion  500 mL Intravenous Once Irene Shipper, MD        No Known Allergies  Review of Systems  Constitutional:  Negative for fever.       (-)unexpected weight  change (-)Adenopathy  HENT:  Negative for congestion, hearing loss and sore throat.        (-)Rhinorrhea   Eyes:        (-)Visual disturbance  Respiratory:  Negative for cough, shortness of breath and wheezing.   Cardiovascular:  Negative for chest pain  and leg swelling.  Gastrointestinal:  Negative for blood in stool, constipation, diarrhea, nausea and vomiting.  Genitourinary:  Negative for dysuria, frequency and hematuria.       (+)occasional urinary incontinence  Musculoskeletal:  Negative for joint pain and myalgias.  Skin:  Negative for rash.       (-)New moles  Neurological:  Negative for headaches.  Psychiatric/Behavioral:  Negative for depression. The patient is not nervous/anxious.       Objective:    Physical Exam Constitutional:      General: She is not in acute distress.    Appearance: Normal appearance. She is not ill-appearing.  HENT:     Head: Normocephalic and atraumatic.     Right Ear: Tympanic membrane, ear canal and external ear normal.     Left Ear: Tympanic membrane, ear canal and external ear normal.  Eyes:     Extraocular Movements: Extraocular movements intact.     Pupils: Pupils are equal, round, and reactive to light.     Comments: No nystagmus  Neck:     Thyroid: No thyromegaly or thyroid tenderness.  Cardiovascular:     Rate and Rhythm: Normal rate and regular rhythm.     Heart sounds: Normal heart sounds. No murmur heard.   No gallop.  Pulmonary:     Effort: Pulmonary effort is normal. No respiratory distress.     Breath sounds: Normal breath sounds. No wheezing or rales.  Abdominal:     General: There is no distension.     Palpations: Abdomen is soft.     Tenderness: There is no abdominal tenderness. There is no guarding.  Musculoskeletal:     Comments: 5/5 strength in both upper and lower extremities  Lymphadenopathy:     Cervical: No cervical adenopathy.  Skin:    General: Skin is warm and dry.  Neurological:     Mental Status: She  is alert and oriented to person, place, and time.     Deep Tendon Reflexes:     Reflex Scores:      Patellar reflexes are 2+ on the right side and 2+ on the left side. Psychiatric:        Behavior: Behavior normal.        Judgment: Judgment normal.    BP (!) 145/79   Pulse 66   Temp 98.3 F (36.8 C)   Resp 18   Ht 5' 2"  (1.575 m)   Wt 199 lb 9.6 oz (90.5 kg)   LMP 03/03/2021 Comment: IUD, cycles irregular  SpO2 98%   BMI 36.51 kg/m  Wt Readings from Last 3 Encounters:  03/31/21 199 lb 9.6 oz (90.5 kg)  03/03/21 201 lb 6.4 oz (91.4 kg)  03/02/21 201 lb 12.8 oz (91.5 kg)       Assessment & Plan:   Problem List Items Addressed This Visit       Unprioritized   Urinary incontinence    New. Will refer to Urogynecology.       Relevant Orders   Ambulatory referral to Urogynecology   Preventative health care - Primary    Wt Readings from Last 3 Encounters:  03/31/21 199 lb 9.6 oz (90.5 kg)  03/03/21 201 lb 6.4 oz (91.4 kg)  03/02/21 201 lb 12.8 oz (91.5 kg)  Shingrix and tetanus today. Mammo, pap, colo up to date. Shingrix #1 today. Updated Td. Declines covid vaccination. Discussed healthy diet/exercise.       Relevant Orders   TSH   Hypokalemia  Relevant Orders   Comp Met (CMET)   HTN (hypertension)    BP mildly elevated today. Will plan to recheck at her 2 month follow up nurse visit. If still elevated, will need to add antihypertensive.       Depression with anxiety    Feeling much better back on lexapro.  Continue same.       Other Visit Diagnoses     Hypertriglyceridemia       Relevant Orders   Lipid panel          No orders of the defined types were placed in this encounter.   I, [name] NP, personally preformed the services described in this documentation.  All medical record entries made by the scribe were at my direction and in my presence.  I have reviewed the chart and discharge instructions (if applicable) and agree that the  record reflects my personal performance and is accurate and complete. 03/31/2021   I,Shehryar Baig,acting as a Education administrator for Nance Pear, NP.,have documented all relevant documentation on the behalf of Nance Pear, NP,as directed by  Nance Pear, NP while in the presence of Nance Pear, NP.   Nance Pear, NP

## 2021-03-31 NOTE — Assessment & Plan Note (Addendum)
Wt Readings from Last 3 Encounters:  03/31/21 199 lb 9.6 oz (90.5 kg)  03/03/21 201 lb 6.4 oz (91.4 kg)  03/02/21 201 lb 12.8 oz (91.5 kg)   Shingrix and tetanus today. Mammo, pap, colo up to date. Shingrix #1 today. Updated Td. Declines covid vaccination. Discussed healthy diet/exercise.

## 2021-03-31 NOTE — Telephone Encounter (Signed)
Appointment is scheduled as shingles #2 and BP check

## 2021-03-31 NOTE — Telephone Encounter (Signed)
Can you please make a note on her upcoming nurse visit 12/22 that she needs shingrix #2 and blood pressure recheck? tks

## 2021-03-31 NOTE — Assessment & Plan Note (Signed)
Feeling much better back on lexapro.  Continue same.

## 2021-03-31 NOTE — Assessment & Plan Note (Signed)
BP mildly elevated today. Will plan to recheck at her 2 month follow up nurse visit. If still elevated, will need to add antihypertensive.

## 2021-04-02 ENCOUNTER — Telehealth: Payer: Self-pay | Admitting: Family

## 2021-04-02 MED ORDER — AMLODIPINE BESYLATE 2.5 MG PO TABS: 2.5000 mg | ORAL_TABLET | Freq: Every day | ORAL | 1 refills | Status: DC

## 2021-04-02 NOTE — Telephone Encounter (Signed)
Called but n/a. Lvm for patient to call back

## 2021-04-02 NOTE — Telephone Encounter (Signed)
Patient called and advised of provider's comments and new prescription. She was scheduled to come back in 2 weeks for nv bp check.

## 2021-04-02 NOTE — Telephone Encounter (Signed)
Please advise pt that her bp was mildly elevated at her appointment and her kidney function was mildly decreased. This can occur as the result of uncontrolled blood pressure. I would recommend that she add amlodipine 2.5 mg once daily and return in 2 weeks for nurse visit BP check.

## 2021-04-05 ENCOUNTER — Other Ambulatory Visit: Payer: Self-pay

## 2021-04-05 ENCOUNTER — Ambulatory Visit
Admission: RE | Admit: 2021-04-05 | Discharge: 2021-04-05 | Disposition: A | Discharge status: Home / Self Care | Payer: 59 | Source: Ambulatory Visit | Attending: Nurse Practitioner | Admitting: Nurse Practitioner

## 2021-04-05 DIAGNOSIS — B181 Chronic viral hepatitis B without delta-agent: Secondary | ICD-10-CM

## 2021-04-05 DIAGNOSIS — K760 Fatty (change of) liver, not elsewhere classified: Secondary | ICD-10-CM

## 2021-04-05 DIAGNOSIS — K7400 Hepatic fibrosis, unspecified: Secondary | ICD-10-CM

## 2021-04-15 ENCOUNTER — Other Ambulatory Visit: Payer: 59

## 2021-04-15 ENCOUNTER — Other Ambulatory Visit: Payer: Self-pay | Admitting: Obstetrics & Gynecology

## 2021-04-16 ENCOUNTER — Ambulatory Visit (INDEPENDENT_AMBULATORY_CARE_PROVIDER_SITE_OTHER): Payer: 59 | Admitting: Family

## 2021-04-16 ENCOUNTER — Other Ambulatory Visit: Payer: Self-pay

## 2021-04-16 VITALS — BP 122/76 | HR 76

## 2021-04-16 DIAGNOSIS — I10 Essential (primary) hypertension: Secondary | ICD-10-CM | POA: Diagnosis not present

## 2021-04-16 NOTE — Progress Notes (Signed)
Pt here for Blood pressure check per [name].  Pt currently takes: amlodipine 5mg    Pt reports compliance with medication.  BP Readings from Last 3 Encounters:  03/31/21 (!) 145/79  03/03/21 120/78  03/02/21 122/80     BP today @ = 122/76 HR = 76  Pt advised per Melissa to continue same medication and to follow up in 3 months.  Appointment scheduled.  Reviewed and agree.  [name] NP

## 2021-04-27 ENCOUNTER — Other Ambulatory Visit: Payer: Self-pay | Admitting: Family

## 2021-05-11 ENCOUNTER — Other Ambulatory Visit: Payer: 59

## 2021-05-11 ENCOUNTER — Other Ambulatory Visit: Payer: Self-pay | Admitting: Obstetrics & Gynecology

## 2021-05-13 ENCOUNTER — Ambulatory Visit: Payer: 59 | Admitting: Obstetrics and Gynecology

## 2021-05-13 NOTE — Progress Notes (Deleted)
[facility] New Patient Evaluation and Consultation  Referring Provider: [name], NP PCP: [name], NP Date of Service: 05/13/2021  SUBJECTIVE Chief Complaint: No chief complaint on file.  History of Present Illness: Brittany Johnson is a 50 y.o. Asian female seen in consultation at the request of NP [name] for evaluation of ***.    Review of records from NP [facility] significant for: Having mild leakage. Wears a pad.   Urinary Symptoms: {urine leakage?:24754} Leaks *** time(s) per {days/wks/mos/yrs:310907}.  Pad use: {NUMBERS 1-10:18281} {pad option:24752} per day.   She {ACTION; IS/IS NFA:21308657} bothered by her UI symptoms.  Day time voids ***.  Nocturia: *** times per night to void. Voiding dysfunction: she {empties:24755} her bladder well.  {DOES NOT does:27190::"does not"} use a catheter to empty bladder.  When urinating, she feels {urine symptoms:24756} Drinks: *** per day  UTIs: {NUMBERS 1-10:18281} UTI's in the last year.   {ACTIONS;DENIES/REPORTS:21021675::"Denies"} history of {urologic concerns:24757}  Pelvic Organ Prolapse Symptoms:                  She {denies/ admits to:24761} a feeling of a bulge the vaginal area. It has been present for {NUMBER 1-10:22536} {days/wks/mos/yrs:310907}.  She {denies/ admits to:24761} seeing a bulge.  This bulge {ACTION; IS/IS QIO:96295284} bothersome.  Bowel Symptom: Bowel movements: *** time(s) per {Time; day/week/month:13537} Stool consistency: {stool consistency:24758} Straining: {yes/no:19897}.  Splinting: {yes/no:19897}.  Incomplete evacuation: {yes/no:19897}.  She {denies/ admits to:24761} accidental bowel leakage / fecal incontinence  Occurs: *** time(s) per {Time; day/week/month:13537}  Consistency with leakage: {stool consistency:24758} Bowel regimen: {bowel regimen:24759} Last colonoscopy: Date ***, Results ***  Sexual Function Sexually active: {yes/no:19897}.   Sexual orientation: {Sexual Orientation:[phone]} Pain with sex: {pain with sex:24762}  Pelvic Pain {denies/ admits to:24761} pelvic pain Location: *** Pain occurs: *** Prior pain treatment: *** Improved by: *** Worsened by: ***   Past Medical History:  Past Medical History:  Diagnosis Date   Abnormal cervical cytology 02/07/2011   ?   Allergy    Anxiety    Bronchitis 05/25/2012   Chicken pox as a child   Fibroid    GERD (gastroesophageal reflux disease)    Hepatitis B    Hx: UTI (urinary tract infection) 02/07/2011   Hypertension    in the past   Hypokalemia 05/25/2012   Left lateral epicondylitis 09/02/2011   Menorrhagia 11/24/2012   Obese 03/04/2011   Palpitations 07/08/2012   in the past   Psoriasis 09/02/2011   Right shoulder strain 03/29/2012   Shingles    Tinea pedis of right foot 03/07/2011     Past Surgical History:   Past Surgical History:  Procedure Laterality Date   DILATION AND CURETTAGE OF UTERUS  2003   SAB   EYE SURGERY     INTRAUTERINE DEVICE (IUD) INSERTION     mirena inserted 05-16-16     Past OB/GYN History: G{NUMBERS 1-10:18281} P{NUMBERS 1-10:18281} Vaginal deliveries: ***,  Forceps/ Vacuum deliveries: ***, Cesarean section: *** Menopausal: {menopausal:24763} Contraception: ***. Last pap smear was ***.  Any history of abnormal pap smears: {yes/no:19897}.   Medications: She has a current medication list which includes the following prescription(s): amlodipine, betamethasone dipropionate, epinephrine, escitalopram, fluticasone, levocetirizine, levonorgestrel, pantoprazole, and symbicort, and the following Facility-Administered Medications: sodium chloride.   Allergies: Patient has No Known Allergies.   Social History:  Social History   Tobacco Use   Smoking status: Never   Smokeless tobacco: Never  Vaping Use   Vaping Use: Never used  Substance Use  Topics   Alcohol use: No   Drug use: No    Relationship status: {relationship  status:24764} She lives with ***.   She {ACTION; IS/IS JJH:41740814} employed ***. Regular exercise: {Yes/No:304960894} History of abuse: {Yes/No:304960894}  Family History:   Family History  Problem Relation Age of Onset   Diabetes Mother        type 2   Hypertension Mother    Hepatitis Mother        Hep B, died with liver issues   Heart attack Father        ? "infection on the outside of his heart"   Hypertension Father    Cancer Father        prostate   Cancer Paternal Grandmother        ?   Colon cancer Neg Hx    Colon polyps Neg Hx    Esophageal cancer Neg Hx    Stomach cancer Neg Hx    Rectal cancer Neg Hx      Review of Systems: ROS   OBJECTIVE Physical Exam: There were no vitals filed for this visit.  Physical Exam   GU / Detailed Urogynecologic Evaluation:  Pelvic Exam: Normal external female genitalia; Bartholin's and Skene's glands normal in appearance; urethral meatus normal in appearance, no urethral masses or discharge.   CST: {gen negative/positive:315881}  Reflexes: bulbocavernosis {DESC; PRESENT/NOT PRESENT:21021351}, anocutaneous {DESC; PRESENT/NOT PRESENT:21021351} ***bilaterally.  Speculum exam reveals normal vaginal mucosa {With/Without:20273} atrophy. Cervix {exam; gyn cervix:30847}. Uterus {exam; pelvic uterus:30849}. Adnexa {exam; adnexa:12223}.    s/p hysterectomy: Speculum exam reveals normal vaginal mucosa {With/Without:20273}  atrophy and normal vaginal cuff.  Adnexa {exam; adnexa:12223}.    With apex supported, anterior compartment defect was {reduced:24765}  Pelvic floor strength {Roman # I-V:19040}/V, puborectalis {Roman # I-V:19040}/V external anal sphincter {Roman # I-V:19040}/V  Pelvic floor musculature: Right levator {Tender/Non-tender:20250}, Right obturator {Tender/Non-tender:20250}, Left levator {Tender/Non-tender:20250}, Left obturator {Tender/Non-tender:20250}  POP-Q:   POP-Q                                                Aa                                               Ba                                                 C                                                Gh                                               Pb  tvl                                                Ap                                               Bp                                                 D     Rectal Exam:  Normal sphincter tone, {rectocele:24766} distal rectocele, enterocoele {DESC; PRESENT/NOT PRESENT:21021351}, no rectal masses, {sign of:24767} dyssynergia when asking the patient to bear down.  Post-Void Residual (PVR) by Bladder Scan: In order to evaluate bladder emptying, we discussed obtaining a postvoid residual and she agreed to this procedure.  Procedure: The ultrasound unit was placed on the patient's abdomen in the suprapubic region after the patient had voided. A PVR of *** ml was obtained by bladder scan.  Laboratory Results: @ENCLABS @   ***I visualized the urine specimen, noting the specimen to be {urine color:24768}  ASSESSMENT AND PLAN Ms. Ramaker is a 51 y.o. with: No diagnosis found.    Jaquita Folds, MD   Medical Decision Making:  - Reviewed/ ordered a clinical laboratory test - Reviewed/ ordered a radiologic study - Reviewed/ ordered medicine test - Decision to obtain old records - Discussion of management of or test interpretation with an external physician / other healthcare professional  - Assessment requiring independent historian - Review and summation of prior records - Independent review of image, tracing or specimen

## 2021-05-20 ENCOUNTER — Other Ambulatory Visit: Payer: 59

## 2021-05-20 ENCOUNTER — Other Ambulatory Visit: Payer: Self-pay | Admitting: Obstetrics & Gynecology

## 2021-05-20 ENCOUNTER — Other Ambulatory Visit: Payer: Self-pay | Admitting: Family

## 2021-05-27 ENCOUNTER — Ambulatory Visit: Payer: 59 | Admitting: Internal Medicine

## 2021-05-31 ENCOUNTER — Other Ambulatory Visit: Payer: Self-pay | Admitting: Family

## 2021-06-03 ENCOUNTER — Ambulatory Visit (INDEPENDENT_AMBULATORY_CARE_PROVIDER_SITE_OTHER): Payer: 59

## 2021-06-03 VITALS — BP 134/85 | HR 65

## 2021-06-03 DIAGNOSIS — Z23 Encounter for immunization: Secondary | ICD-10-CM

## 2021-06-03 NOTE — Progress Notes (Signed)
Pt here for Blood pressure check per [name].   Pt currently takes: amlodipine 5mg      Pt reports compliance with medication BP Readings from Last 3 Encounters:  04/16/21 122/76  03/31/21 (!) 145/79  03/03/21 120/78   Bp today is 134/85 Patient will continue same medication dose and follow up with provider in April.

## 2021-06-15 ENCOUNTER — Other Ambulatory Visit: Payer: Self-pay

## 2021-06-15 ENCOUNTER — Ambulatory Visit: Payer: BC Managed Care – PPO | Admitting: Obstetrics & Gynecology

## 2021-06-15 ENCOUNTER — Ambulatory Visit (INDEPENDENT_AMBULATORY_CARE_PROVIDER_SITE_OTHER): Payer: BC Managed Care – PPO

## 2021-06-15 DIAGNOSIS — Z30431 Encounter for routine checking of intrauterine contraceptive device: Secondary | ICD-10-CM | POA: Diagnosis not present

## 2021-06-15 DIAGNOSIS — N921 Excessive and frequent menstruation with irregular cycle: Secondary | ICD-10-CM | POA: Diagnosis not present

## 2021-06-15 DIAGNOSIS — Z975 Presence of (intrauterine) contraceptive device: Secondary | ICD-10-CM

## 2021-06-15 NOTE — Progress Notes (Signed)
° ° °  Brittany Johnson 29-Jan-1971 [id number]        51 y.o.  [id number]  [id number] Married   RP:  Established patient presenting for annual gyn exam    HPI: Contraception Mirena IUD x 2017.  Had increased frequency of light BTB in 02/2021, but no longer having BTB since then.  No pelvic pain.  No pain with IC.  Pap 02/2021 ASCUS with Neg HPV HR.     OB History  Gravida Para Term Preterm AB Living  4 2     2 2   SAB IAB Ectopic Multiple Live Births  2       2    # Outcome Date GA Lbr Len/2nd Weight Sex Delivery Anes PTL Lv  4 SAB           3 SAB           2 Para     F Vag-Spont   LIV  1 Para     M Vag-Spont   LIV    Past medical history,surgical history, problem list, medications, allergies, family history and social history were all reviewed and documented in the EPIC chart.   Directed ROS with pertinent positives and negatives documented in the history of present illness/assessment and plan.  Exam:  There were no vitals filed for this visit. General appearance:  Normal  Pelvic US today: Comparison is made with the previous scan in 2017.  T/V images.  Anteverted uterus normal in size and shape, measured at 8.02 x 5.02 x 4.27 cm.  2 small intramural posterior fibroids are present, they are measured at 1.18 and 0.94 cm, which is smaller than on the previous scan.  The endometrial lining is thin and symmetrical measured at 2.34 mm with no mass or thickening seen.  The IUD is noted in proper intra uterine position.  Both ovaries are small with sparse follicles.  No adnexal masses seen.  No free fluid in the pelvis.   Assessment/Plan:  52 y.o. [id number]   1. Breakthrough bleeding associated with intrauterine device (IUD) Resolved BTB.  Pelvic US findings thoroughly reviewed with patient.  Pelvic US showing the IUD in good IU position.  Thin normal Endometrial line.  Decision to continue with same Mirena IUD since it can be continued for 7 years.  2. Encounter for routine checking of  intrauterine contraceptive device (IUD) Mirena IUD x 2017 in good position.  Resolved BTB.  Possibly entering perimenopause/menopause.  Will check [place] level to help in deciding on contraception duration need in the future. - [facility]   Princess Bruins MD, 2:47 PM 06/15/2021

## 2021-06-16 ENCOUNTER — Encounter: Payer: Self-pay | Admitting: Obstetrics & Gynecology

## 2021-06-16 LAB — FOLLICLE STIMULATING HORMONE: FSH: 71.6 m[IU]/mL

## 2021-06-27 ENCOUNTER — Other Ambulatory Visit: Payer: Self-pay | Admitting: Family

## 2021-07-01 ENCOUNTER — Other Ambulatory Visit: Payer: Self-pay | Admitting: Allergy and Immunology

## 2021-07-01 ENCOUNTER — Telehealth: Payer: Self-pay | Admitting: *Deleted

## 2021-07-01 NOTE — Telephone Encounter (Signed)
PA has been submitted through CoverMyMeds for Pantoprazole and is currently pending approval/denial.  

## 2021-07-01 NOTE — Telephone Encounter (Signed)
Error

## 2021-07-05 ENCOUNTER — Other Ambulatory Visit: Payer: Self-pay | Admitting: *Deleted

## 2021-07-05 MED ORDER — ESOMEPRAZOLE MAGNESIUM 40 MG PO CPDR: 40.0000 mg | DELAYED_RELEASE_CAPSULE | Freq: Two times a day (BID) | ORAL | 5 refills | Status: DC

## 2021-07-05 NOTE — Telephone Encounter (Signed)
New prescription has been sent in.  

## 2021-07-05 NOTE — Telephone Encounter (Signed)
Yes it didn't state that it was due to a quantity issue, it just stated that she needed to try and fail these other medications first. Unfortunately it could be because the insurance has changed its formulary in the new year and no longer wants to cover the medication without a trial and failure of other meds first.

## 2021-07-05 NOTE — Telephone Encounter (Signed)
PA has been denied stating that the patient must try and fail Lansoprazole and Esomeprazole. Please advise change in medication.

## 2021-07-06 ENCOUNTER — Ambulatory Visit: Payer: BC Managed Care – PPO | Admitting: Internal Medicine

## 2021-07-06 ENCOUNTER — Encounter: Payer: Self-pay | Admitting: Internal Medicine

## 2021-07-06 VITALS — BP 126/80 | HR 75 | Ht 62.0 in | Wt 204.1 lb

## 2021-07-06 DIAGNOSIS — K219 Gastro-esophageal reflux disease without esophagitis: Secondary | ICD-10-CM

## 2021-07-06 DIAGNOSIS — R053 Chronic cough: Secondary | ICD-10-CM

## 2021-07-06 DIAGNOSIS — Z6837 Body mass index (BMI) 37.0-37.9, adult: Secondary | ICD-10-CM | POA: Diagnosis not present

## 2021-07-06 MED ORDER — PANTOPRAZOLE SODIUM 40 MG PO TBEC: 40.0000 mg | DELAYED_RELEASE_TABLET | Freq: Two times a day (BID) | ORAL | 3 refills | Status: DC

## 2021-07-06 NOTE — Patient Instructions (Signed)
If you are age 52 or older, your body mass index should be between 23-30. Your Body mass index is 37.33 kg/m. If this is out of the aforementioned range listed, please consider follow up with your Primary Care Provider.  If you are age 35 or younger, your body mass index should be between 19-25. Your Body mass index is 37.33 kg/m. If this is out of the aformentioned range listed, please consider follow up with your Primary Care Provider.   ________________________________________________________  The [place] [facility] providers would like to encourage you to use [facility] to communicate with providers for non-urgent requests or questions.  Due to long hold times on the telephone, sending your provider a message by [facility] may be a faster and more efficient way to get a response.  Please allow 48 business hours for a response.  Please remember that this is for non-urgent requests.  _______________________________________________________  We have sent the following medications to your pharmacy for you to pick up at your convenience:  Pantoprazole.  Please follow up in one year

## 2021-07-06 NOTE — Progress Notes (Signed)
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  At that time she did report GERD symptoms.  Her problems with cough worsened after COVID infection.  In any event, she underwent upper endoscopy October 2021.  This was normal.  She was continued on previously initiated pantoprazole 40 mg daily.  At the time of her last office follow-up May 22, 2020 she was doing well.  There was resolution of chronic cough after institution of PPI suggesting GERD related cough.  She presents today for routine follow-up.  She is pleased to report that as long as she takes her medication she does well.  If she misses PPI she will experience GERD symptoms and pharyngeal symptoms.  No appreciable medication side effects.  She is up-to-date on colon cancer screening with normal colonoscopy May 2021.  She request medication refill.  Blood work from March 31, 2021 reveals normal comprehensive metabolic panel except for creatinine 1.29  REVIEW OF SYSTEMS:  All non-GI ROS negative unless otherwise stated in the HPI r  Past Medical History:  Diagnosis Date   Abnormal cervical cytology 02/07/2011   ?   Allergy    Anxiety    Bronchitis 05/25/2012   Chicken pox as a child   Fibroid    GERD (gastroesophageal reflux disease)    Hepatitis B    Hx: UTI (urinary tract infection) 02/07/2011   Hypertension    in the past   Hypokalemia 05/25/2012   Left lateral epicondylitis 09/02/2011   Menorrhagia 11/24/2012   Obese 03/04/2011   Palpitations 07/08/2012   in the past   Psoriasis 09/02/2011   Right shoulder strain 03/29/2012   Shingles    Tinea pedis of right foot 03/07/2011    Past Surgical History:  Procedure Laterality Date   DILATION AND CURETTAGE OF UTERUS  2003   SAB   EYE SURGERY     INTRAUTERINE DEVICE (IUD) INSERTION     mirena inserted 05-16-16    Social History Brittany  Johnson  reports that she has never smoked. She has never used smokeless tobacco. She reports that she does not drink alcohol and does not use drugs.  family history includes Cancer in her father and paternal grandmother; Diabetes in her mother; Heart attack in her father; Hepatitis in her mother; Hypertension in her father and mother.  No Known Allergies     PHYSICAL EXAMINATION: Vital signs: BP 126/80    Pulse 75    Ht 5\' 2"  (1.575 m)    Wt 204 lb 2 oz (92.6 kg)    BMI 37.33 kg/m   Constitutional: generally well-appearing, no acute distress Psychiatric: alert and oriented x3, cooperative Eyes: extraocular movements intact, anicteric, conjunctiva pink Mouth: Mask Neck: supple no lymphadenopathy Cardiovascular: heart regular rate and rhythm, no murmur Lungs: clear to auscultation bilaterally Abdomen: soft, nontender, nondistended, no obvious ascites, no peritoneal signs, normal bowel sounds, no organomegaly Rectal: Omitted Extremities: no clubbing, cyanosis, or  edema bilaterally Skin: no lesions on visible extremities Neuro: No focal deficits.  Cranial nerves intact  ASSESSMENT:  1.  GERD.  Classic symptoms and GERD related cough addressed with PPI therapy.  Recurrent symptoms off therapy. 2.  Normal EGD 2021 3.  Normal colonoscopy 2021 4.  Obesity with BMI 37   PLAN:  1.  Reflux precautions 2.  Weight loss 3.  Refill pantoprazole 40 mg daily.  Medication risks reviewed  4.  Surveillance colonoscopy around 2031 5.  Routine office follow-up 1 year.  Contact the office in the interim for any questions or problems.  She agrees

## 2021-07-21 ENCOUNTER — Ambulatory Visit: Payer: Self-pay | Admitting: Family

## 2021-07-23 ENCOUNTER — Ambulatory Visit: Payer: 59 | Admitting: Family

## 2021-07-28 ENCOUNTER — Other Ambulatory Visit: Payer: Self-pay | Admitting: Family

## 2021-08-12 ENCOUNTER — Other Ambulatory Visit: Payer: Self-pay | Admitting: Family

## 2021-08-12 ENCOUNTER — Other Ambulatory Visit: Payer: Self-pay | Admitting: Nurse Practitioner

## 2021-08-12 DIAGNOSIS — K760 Fatty (change of) liver, not elsewhere classified: Secondary | ICD-10-CM | POA: Diagnosis not present

## 2021-08-12 DIAGNOSIS — B181 Chronic viral hepatitis B without delta-agent: Secondary | ICD-10-CM

## 2021-08-12 DIAGNOSIS — K7402 Hepatic fibrosis, advanced fibrosis: Secondary | ICD-10-CM

## 2021-08-20 ENCOUNTER — Other Ambulatory Visit: Payer: Self-pay

## 2021-08-20 ENCOUNTER — Ambulatory Visit
Admission: RE | Admit: 2021-08-20 | Discharge: 2021-08-20 | Disposition: A | Discharge status: Home / Self Care | Payer: BC Managed Care – PPO | Source: Ambulatory Visit | Attending: Nurse Practitioner | Admitting: Nurse Practitioner

## 2021-08-20 DIAGNOSIS — K7402 Hepatic fibrosis, advanced fibrosis: Secondary | ICD-10-CM

## 2021-08-20 DIAGNOSIS — B181 Chronic viral hepatitis B without delta-agent: Secondary | ICD-10-CM

## 2021-08-26 DIAGNOSIS — M7710 Lateral epicondylitis, unspecified elbow: Secondary | ICD-10-CM | POA: Diagnosis not present

## 2021-08-26 DIAGNOSIS — M75100 Unspecified rotator cuff tear or rupture of unspecified shoulder, not specified as traumatic: Secondary | ICD-10-CM | POA: Diagnosis not present

## 2021-08-26 DIAGNOSIS — M9907 Segmental and somatic dysfunction of upper extremity: Secondary | ICD-10-CM | POA: Diagnosis not present

## 2021-08-26 DIAGNOSIS — M9905 Segmental and somatic dysfunction of pelvic region: Secondary | ICD-10-CM | POA: Diagnosis not present

## 2021-08-26 DIAGNOSIS — M47816 Spondylosis without myelopathy or radiculopathy, lumbar region: Secondary | ICD-10-CM | POA: Diagnosis not present

## 2021-08-26 DIAGNOSIS — M9903 Segmental and somatic dysfunction of lumbar region: Secondary | ICD-10-CM | POA: Diagnosis not present

## 2021-08-26 DIAGNOSIS — M5136 Other intervertebral disc degeneration, lumbar region: Secondary | ICD-10-CM | POA: Diagnosis not present

## 2021-08-26 DIAGNOSIS — M9901 Segmental and somatic dysfunction of cervical region: Secondary | ICD-10-CM | POA: Diagnosis not present

## 2021-08-26 DIAGNOSIS — M5137 Other intervertebral disc degeneration, lumbosacral region: Secondary | ICD-10-CM | POA: Diagnosis not present

## 2021-08-30 DIAGNOSIS — M7710 Lateral epicondylitis, unspecified elbow: Secondary | ICD-10-CM | POA: Diagnosis not present

## 2021-08-30 DIAGNOSIS — M47816 Spondylosis without myelopathy or radiculopathy, lumbar region: Secondary | ICD-10-CM | POA: Diagnosis not present

## 2021-08-30 DIAGNOSIS — M5137 Other intervertebral disc degeneration, lumbosacral region: Secondary | ICD-10-CM | POA: Diagnosis not present

## 2021-08-30 DIAGNOSIS — M9905 Segmental and somatic dysfunction of pelvic region: Secondary | ICD-10-CM | POA: Diagnosis not present

## 2021-08-30 DIAGNOSIS — M9907 Segmental and somatic dysfunction of upper extremity: Secondary | ICD-10-CM | POA: Diagnosis not present

## 2021-08-30 DIAGNOSIS — M75100 Unspecified rotator cuff tear or rupture of unspecified shoulder, not specified as traumatic: Secondary | ICD-10-CM | POA: Diagnosis not present

## 2021-08-30 DIAGNOSIS — M5136 Other intervertebral disc degeneration, lumbar region: Secondary | ICD-10-CM | POA: Diagnosis not present

## 2021-08-30 DIAGNOSIS — M9903 Segmental and somatic dysfunction of lumbar region: Secondary | ICD-10-CM | POA: Diagnosis not present

## 2021-09-03 DIAGNOSIS — M9907 Segmental and somatic dysfunction of upper extremity: Secondary | ICD-10-CM | POA: Diagnosis not present

## 2021-09-03 DIAGNOSIS — M47816 Spondylosis without myelopathy or radiculopathy, lumbar region: Secondary | ICD-10-CM | POA: Diagnosis not present

## 2021-09-03 DIAGNOSIS — M75100 Unspecified rotator cuff tear or rupture of unspecified shoulder, not specified as traumatic: Secondary | ICD-10-CM | POA: Diagnosis not present

## 2021-09-03 DIAGNOSIS — M7710 Lateral epicondylitis, unspecified elbow: Secondary | ICD-10-CM | POA: Diagnosis not present

## 2021-09-03 DIAGNOSIS — M9901 Segmental and somatic dysfunction of cervical region: Secondary | ICD-10-CM | POA: Diagnosis not present

## 2021-09-06 DIAGNOSIS — M9907 Segmental and somatic dysfunction of upper extremity: Secondary | ICD-10-CM | POA: Diagnosis not present

## 2021-09-06 DIAGNOSIS — M47816 Spondylosis without myelopathy or radiculopathy, lumbar region: Secondary | ICD-10-CM | POA: Diagnosis not present

## 2021-09-06 DIAGNOSIS — M9901 Segmental and somatic dysfunction of cervical region: Secondary | ICD-10-CM | POA: Diagnosis not present

## 2021-09-06 DIAGNOSIS — M7710 Lateral epicondylitis, unspecified elbow: Secondary | ICD-10-CM | POA: Diagnosis not present

## 2021-09-06 DIAGNOSIS — M75100 Unspecified rotator cuff tear or rupture of unspecified shoulder, not specified as traumatic: Secondary | ICD-10-CM | POA: Diagnosis not present

## 2021-09-07 ENCOUNTER — Other Ambulatory Visit: Payer: Self-pay | Admitting: Family

## 2021-09-07 DIAGNOSIS — Z1231 Encounter for screening mammogram for malignant neoplasm of breast: Secondary | ICD-10-CM

## 2021-09-09 ENCOUNTER — Ambulatory Visit
Admission: RE | Admit: 2021-09-09 | Discharge: 2021-09-09 | Disposition: A | Discharge status: Home / Self Care | Payer: BC Managed Care – PPO | Source: Ambulatory Visit | Attending: Family | Admitting: Family

## 2021-09-09 DIAGNOSIS — B181 Chronic viral hepatitis B without delta-agent: Secondary | ICD-10-CM | POA: Diagnosis not present

## 2021-09-09 DIAGNOSIS — K760 Fatty (change of) liver, not elsewhere classified: Secondary | ICD-10-CM | POA: Diagnosis not present

## 2021-09-09 DIAGNOSIS — Z1231 Encounter for screening mammogram for malignant neoplasm of breast: Secondary | ICD-10-CM | POA: Diagnosis not present

## 2021-09-09 DIAGNOSIS — K7402 Hepatic fibrosis, advanced fibrosis: Secondary | ICD-10-CM | POA: Diagnosis not present

## 2021-09-13 DIAGNOSIS — M7710 Lateral epicondylitis, unspecified elbow: Secondary | ICD-10-CM | POA: Diagnosis not present

## 2021-09-13 DIAGNOSIS — M9901 Segmental and somatic dysfunction of cervical region: Secondary | ICD-10-CM | POA: Diagnosis not present

## 2021-09-13 DIAGNOSIS — M47816 Spondylosis without myelopathy or radiculopathy, lumbar region: Secondary | ICD-10-CM | POA: Diagnosis not present

## 2021-09-13 DIAGNOSIS — M9907 Segmental and somatic dysfunction of upper extremity: Secondary | ICD-10-CM | POA: Diagnosis not present

## 2021-09-13 DIAGNOSIS — M75100 Unspecified rotator cuff tear or rupture of unspecified shoulder, not specified as traumatic: Secondary | ICD-10-CM | POA: Diagnosis not present

## 2021-09-16 DIAGNOSIS — M7710 Lateral epicondylitis, unspecified elbow: Secondary | ICD-10-CM | POA: Diagnosis not present

## 2021-09-16 DIAGNOSIS — M9901 Segmental and somatic dysfunction of cervical region: Secondary | ICD-10-CM | POA: Diagnosis not present

## 2021-09-16 DIAGNOSIS — M75100 Unspecified rotator cuff tear or rupture of unspecified shoulder, not specified as traumatic: Secondary | ICD-10-CM | POA: Diagnosis not present

## 2021-09-16 DIAGNOSIS — M47816 Spondylosis without myelopathy or radiculopathy, lumbar region: Secondary | ICD-10-CM | POA: Diagnosis not present

## 2021-09-16 DIAGNOSIS — M9907 Segmental and somatic dysfunction of upper extremity: Secondary | ICD-10-CM | POA: Diagnosis not present

## 2021-09-29 ENCOUNTER — Ambulatory Visit: Payer: BC Managed Care – PPO | Admitting: Family

## 2021-09-29 VITALS — BP 134/60 | HR 78 | Temp 98.2°F | Resp 18 | Ht 62.0 in | Wt 214.0 lb

## 2021-09-29 DIAGNOSIS — F418 Other specified anxiety disorders: Secondary | ICD-10-CM | POA: Diagnosis not present

## 2021-09-29 DIAGNOSIS — M7711 Lateral epicondylitis, right elbow: Secondary | ICD-10-CM

## 2021-09-29 DIAGNOSIS — M25511 Pain in right shoulder: Secondary | ICD-10-CM

## 2021-09-29 DIAGNOSIS — I10 Essential (primary) hypertension: Secondary | ICD-10-CM

## 2021-09-29 MED ORDER — AMLODIPINE BESYLATE 2.5 MG PO TABS: 2.5000 mg | ORAL_TABLET | Freq: Every day | ORAL | 1 refills | Status: DC

## 2021-09-29 MED ORDER — ESCITALOPRAM OXALATE 10 MG PO TABS: 10.0000 mg | ORAL_TABLET | Freq: Every day | ORAL | 1 refills | Status: DC

## 2021-09-29 MED ORDER — MELOXICAM 7.5 MG PO TABS: 7.5000 mg | ORAL_TABLET | Freq: Every day | ORAL | 0 refills | Status: DC

## 2021-09-29 NOTE — Assessment & Plan Note (Signed)
New. Rx sent for meloxicam. Refer to sports medicine.  ?

## 2021-09-29 NOTE — Assessment & Plan Note (Signed)
Stable on lexapro '10mg'$ . Continue same.  ?

## 2021-09-29 NOTE — Progress Notes (Signed)
? ?Subjective:  ? ?By signing my name below, I, Joaquin Music, attest that this documentation has been prepared under the direction and in the presence of [name], NP 09/29/2021   ?   ? ? Patient ID: Brittany Johnson, female    DOB: [birth year], 51 y.o.   MRN: [id number] ? ?Chief Complaint  ?Patient presents with  ? Follow-up  ?  6 month   ? ? ?HPI ?Patient is in today for an office visit and 6 month f/u ? ?Shoulder Pain- She reports right shoulder pain that has been present for a few months. She went to a chiropractor but the pain has not resolved. She also has "tennis elbow" on her right arm ? ?Weight- She reports she has been eating better and trying to get 2000 steps a day but is not losing any weight. She went to her GYN and was told it is post-menopausal. ? ?Mood- She reports her mood being good on 10 mg lexapro. No complaints. ? ?Blood pressure- Her blood pressure is stable today. She has been taking 2.5 mg amlodipine and has no complaints.  ?BP Readings from Last 3 Encounters:  ?09/29/21 134/60  ?07/06/21 126/80  ?06/15/21 106/70  ?  ? ?Past Medical History:  ?Diagnosis Date  ? Abnormal cervical cytology 02/07/2011  ? ?  ? Allergy   ? Anxiety   ? Bronchitis 05/25/2012  ? Chicken pox as a child  ? Fibroid   ? GERD (gastroesophageal reflux disease)   ? Hepatitis B   ? Hx: UTI (urinary tract infection) 02/07/2011  ? Hypertension   ? in the past  ? Hypokalemia 05/25/2012  ? Left lateral epicondylitis 09/02/2011  ? Menorrhagia 11/24/2012  ? Obese 03/04/2011  ? Palpitations 07/08/2012  ? in the past  ? Psoriasis 09/02/2011  ? Right shoulder strain 03/29/2012  ? Shingles   ? Tinea pedis of right foot 03/07/2011  ? ? ?Past Surgical History:  ?Procedure Laterality Date  ? DILATION AND CURETTAGE OF UTERUS  2003  ? SAB  ? EYE SURGERY    ? INTRAUTERINE DEVICE (IUD) INSERTION    ? mirena inserted 05-16-16  ? ? ?Family History  ?Problem Relation Age of Onset  ? Diabetes Mother   ?     type 2  ? Hypertension Mother   ?  Hepatitis Mother   ?     Hep B, died with liver issues  ? Heart attack Father   ?     ? "infection on the outside of his heart"  ? Hypertension Father   ? Cancer Father   ?     prostate  ? Cancer Paternal Grandmother   ?     ?  ? Colon cancer Neg Hx   ? Colon polyps Neg Hx   ? Esophageal cancer Neg Hx   ? Stomach cancer Neg Hx   ? Rectal cancer Neg Hx   ? Breast cancer Neg Hx   ? ? ?Social History  ? ?Socioeconomic History  ? Marital status: Married  ?  Spouse name: Not on file  ? Number of children: Not on file  ? Years of education: Not on file  ? Highest education level: Not on file  ?Occupational History  ? Not on file  ?Tobacco Use  ? Smoking status: Never  ? Smokeless tobacco: Never  ?Vaping Use  ? Vaping Use: Never used  ?Substance and Sexual Activity  ? Alcohol use: No  ? Drug use: No  ? Sexual  activity: Yes  ?  Partners: Male  ?  Birth control/protection: I.U.D.  ?Other Topics Concern  ? Not on file  ?Social History Narrative  ? Married for 23 yrs  ? 2 step children- grown  ? 2001 Son  ? 2005 daughter  ? Works as an Scientist, water quality at [employer]   ? Completed bachelor's degree  ? Enjoys outdoor activities, golf, reading  ? Armandina [employer]  ? ?Social Determinants of Health  ? ?Financial Resource Strain: Not on file  ?Food Insecurity: Not on file  ?Transportation Needs: Not on file  ?Physical Activity: Not on file  ?Stress: Not on file  ?Social Connections: Not on file  ?Intimate Partner Violence: Not on file  ? ? ?Outpatient Medications Prior to Visit  ?Medication Sig Dispense Refill  ? betamethasone dipropionate (DIPROLENE) 0.05 % cream APPLY TO AFFECTED AREA TWICE A DAY 30 g 1  ? EPINEPHrine 0.3 mg/0.3 mL IJ SOAJ injection SMARTSIG:0.3 Milligram(s) IM Once PRN    ? fluticasone (FLONASE) 50 MCG/ACT nasal spray SPRAY 1-2 SPRAYS IN EACH NOSTRIL ONCE A DAY    ? levocetirizine (XYZAL) 5 MG tablet TAKE 1 TABLET (5 MG TOTAL) BY MOUTH 2 (TWO) TIMES DAILY AS NEEDED FOR ALLERGIES. 60 tablet 5  ?  levonorgestrel (MIRENA) 20 MCG/24HR IUD 1 each by Intrauterine route once.    ? pantoprazole (PROTONIX) 40 MG tablet Take 1 tablet (40 mg total) by mouth 2 (two) times daily. 90 tablet 3  ? SYMBICORT 160-4.5 MCG/ACT inhaler Inhale 2 puffs into the lungs in the morning and at bedtime.    ? amLODipine (NORVASC) 2.5 MG tablet Take 1 tablet (2.5 mg total) by mouth daily. 90 tablet 0  ? escitalopram (LEXAPRO) 10 MG tablet Take 1 tablet (10 mg total) by mouth daily. 90 tablet 1  ? esomeprazole (NEXIUM) 40 MG capsule Take 1 capsule (40 mg total) by mouth 2 (two) times daily before a meal. 60 capsule 5  ? ?Facility-Administered Medications Prior to Visit  ?Medication Dose Route Frequency Provider Last Rate Last Admin  ? 0.9 %  sodium chloride infusion  500 mL Intravenous Once Irene Shipper, MD      ? ? ?No Known Allergies ? ?Review of Systems  ?Constitutional:  Negative for fever.  ?HENT:  Negative for ear pain and hearing loss.   ?     (-)nystagmus ?(-)adenopathy  ?Eyes:  Negative for blurred vision.  ?Respiratory:  Negative for cough, shortness of breath and wheezing.   ?Cardiovascular:  Negative for chest pain and leg swelling.  ?Gastrointestinal:  Negative for blood in stool, diarrhea, nausea and vomiting.  ?Genitourinary:  Negative for dysuria and frequency.  ?Musculoskeletal:  Positive for joint pain (right shoulder). Negative for myalgias.  ?Skin:  Negative for rash.  ?Neurological:  Negative for headaches.  ?Psychiatric/Behavioral:  Negative for depression. The patient is not nervous/anxious.   ? ?   ?Objective:  ?  ?Physical Exam ?Constitutional:   ?   General: She is not in acute distress. ?   Appearance: Normal appearance. She is not ill-appearing.  ?HENT:  ?   Head: Normocephalic and atraumatic.  ?   Right Ear: External ear normal.  ?   Left Ear: External ear normal.  ?Eyes:  ?   Extraocular Movements: Extraocular movements intact.  ?   Pupils: Pupils are equal, round, and reactive to light.  ?Pulmonary:  ?    Effort: No respiratory distress.  ?   Breath sounds: No wheezing  or rhonchi.  ?Musculoskeletal:  ?   Right shoulder: Tenderness present. Decreased range of motion.  ?   Cervical back: Neck supple.  ?   Comments: Right anterior shoulder tenderness ?Shoulder pain with flexion/abduction right arm  ?Lymphadenopathy:  ?   Cervical: No cervical adenopathy.  ?Skin: ?   General: Skin is warm and dry.  ?Neurological:  ?   Mental Status: She is alert and oriented to person, place, and time.  ?Psychiatric:     ?   Behavior: Behavior normal.     ?   Judgment: Judgment normal.  ? ? ?BP 134/60   Pulse 78   Temp 98.2 ?F (36.8 ?C)   Resp 18   Ht '5\' 2"'$  (1.575 m)   Wt 214 lb (97.1 kg)   SpO2 97%   BMI 39.14 kg/m?  ?Wt Readings from Last 3 Encounters:  ?09/29/21 214 lb (97.1 kg)  ?07/06/21 204 lb 2 oz (92.6 kg)  ?03/31/21 199 lb 9.6 oz (90.5 kg)  ? ? ?Diabetic Foot Exam - Simple   ?No data filed ?  ? ?Lab Results  ?Component Value Date  ? WBC 7.2 01/15/2019  ? HGB 14.9 01/15/2019  ? HCT 44.4 01/15/2019  ? PLT 235.0 01/15/2019  ? GLUCOSE 93 03/31/2021  ? CHOL 171 03/31/2021  ? TRIG 120.0 03/31/2021  ? HDL 45.60 03/31/2021  ? LDLDIRECT 86.0 01/15/2019  ? LDLCALC 101 (H) 03/31/2021  ? ALT 20 03/31/2021  ? AST 19 03/31/2021  ? NA 140 03/31/2021  ? K 4.6 03/31/2021  ? CL 103 03/31/2021  ? CREATININE 1.29 (H) 03/31/2021  ? BUN 19 03/31/2021  ? CO2 31 03/31/2021  ? TSH 3.59 03/31/2021  ? ? ?Lab Results  ?Component Value Date  ? TSH 3.59 03/31/2021  ? ?Lab Results  ?Component Value Date  ? WBC 7.2 01/15/2019  ? HGB 14.9 01/15/2019  ? HCT 44.4 01/15/2019  ? MCV 90.2 01/15/2019  ? PLT 235.0 01/15/2019  ? ?Lab Results  ?Component Value Date  ? NA 140 03/31/2021  ? K 4.6 03/31/2021  ? CO2 31 03/31/2021  ? GLUCOSE 93 03/31/2021  ? BUN 19 03/31/2021  ? CREATININE 1.29 (H) 03/31/2021  ? BILITOT 0.7 03/31/2021  ? ALKPHOS 57 03/31/2021  ? AST 19 03/31/2021  ? ALT 20 03/31/2021  ? PROT 7.5 03/31/2021  ? ALBUMIN 4.5 03/31/2021  ? CALCIUM 10.1  03/31/2021  ? GFR 48.03 (L) 03/31/2021  ? ?Lab Results  ?Component Value Date  ? CHOL 171 03/31/2021  ? ?Lab Results  ?Component Value Date  ? HDL 45.60 03/31/2021  ? ?Lab Results  ?Component Value Date  ? LDLCALC 101 (H) 10

## 2021-09-29 NOTE — Assessment & Plan Note (Signed)
BP is at goal. Continue amlodipine 2.'5mg'$ .  ?

## 2021-09-29 NOTE — Assessment & Plan Note (Addendum)
Wt Readings from Last 3 Encounters:  ?09/29/21 214 lb (97.1 kg)  ?07/06/21 204 lb 2 oz (92.6 kg)  ?03/31/21 199 lb 9.6 oz (90.5 kg)  ? ?We discussed counting calories to try to keep intake 1300-1500/day. If she is not losing at this caloric range, can drop to 1200 calories/day.  ?

## 2021-10-05 ENCOUNTER — Ambulatory Visit: Payer: BC Managed Care – PPO | Admitting: Family Medicine

## 2022-01-05 DIAGNOSIS — B181 Chronic viral hepatitis B without delta-agent: Secondary | ICD-10-CM | POA: Diagnosis not present

## 2022-01-13 ENCOUNTER — Other Ambulatory Visit: Payer: Self-pay | Admitting: Nurse Practitioner

## 2022-01-13 DIAGNOSIS — B181 Chronic viral hepatitis B without delta-agent: Secondary | ICD-10-CM | POA: Diagnosis not present

## 2022-01-13 DIAGNOSIS — K7402 Hepatic fibrosis, advanced fibrosis: Secondary | ICD-10-CM

## 2022-01-13 DIAGNOSIS — K760 Fatty (change of) liver, not elsewhere classified: Secondary | ICD-10-CM

## 2022-02-16 NOTE — Progress Notes (Signed)
Benito Mccreedy D.[place] Phone: [phone]   Assessment and Plan:     1. Chronic right shoulder pain 2. Impingement of right shoulder  -Chronic with exacerbation, initial sports medicine visit - X-ray obtained in clinic.  My interpretation: No acute fracture or dislocation.  Spurring of distal clavicle at [facility] joint. -Consistent with impingement syndrome of right shoulder based on HPI, physical exam, unremarkable x-ray imaging - Patient elected for subacromial CSI.  Tolerated well per note below - Start HEP and physical therapy for shoulder.  Referral sent  Procedure: Subacromial Injection Side: Right  Risks explained and consent was given verbally. The site was cleaned with alcohol prep. A steroid injection was performed from posterior approach using 28m of 1% lidocaine without epinephrine and 114mof kenalog '40mg'$ /ml. This was well tolerated and resulted in symptomatic relief.  Needle was removed, hemostasis achieved, and post injection instructions were explained.   Pt was advised to call or return to clinic if these symptoms worsen or fail to improve as anticipated.    Pertinent previous records reviewed include none   Follow Up: 3 to 4 weeks for reevaluation.  Could consider ultrasound if no improvement or worsening symptoms   Subjective:   I, Moenique Parris, am serving as a scEducation administratoror Doctor BeGlennon MacChief Complaint: right shoulder pain   HPI:   02/17/2022 Patient is a [age]ear old female complaining of right shoulder pain . Patient states that shoulder has hurt on and off for along time, PT states she feels like it was out of place, no numbness or tingling, has had a CSI before the symptoms where just different was told it was bursitis, no meds for the pain, has used ice , no radiating pain but has hx of elbow pain, decreased ROM ,   Relevant Historical Information: Hypertension  Additional  pertinent review of systems negative.   Current Outpatient Medications:    amLODipine (NORVASC) 2.5 MG tablet, Take 1 tablet (2.5 mg total) by mouth daily., Disp: 90 tablet, Rfl: 1   betamethasone dipropionate (DIPROLENE) 0.05 % cream, APPLY TO AFFECTED AREA TWICE A DAY, Disp: 30 g, Rfl: 1   EPINEPHrine 0.3 mg/0.3 mL IJ SOAJ injection, SMARTSIG:0.3 Milligram(s) IM Once PRN, Disp: , Rfl:    escitalopram (LEXAPRO) 10 MG tablet, Take 1 tablet (10 mg total) by mouth daily., Disp: 90 tablet, Rfl: 1   fluticasone (FLONASE) 50 MCG/ACT nasal spray, SPRAY 1-2 SPRAYS IN EACH NOSTRIL ONCE A DAY, Disp: , Rfl:    levocetirizine (XYZAL) 5 MG tablet, TAKE 1 TABLET (5 MG TOTAL) BY MOUTH 2 (TWO) TIMES DAILY AS NEEDED FOR ALLERGIES., Disp: 60 tablet, Rfl: 5   levonorgestrel (MIRENA) 20 MCG/24HR IUD, 1 each by Intrauterine route once., Disp: , Rfl:    meloxicam (MOBIC) 7.5 MG tablet, Take 1 tablet (7.5 mg total) by mouth daily., Disp: 14 tablet, Rfl: 0   pantoprazole (PROTONIX) 40 MG tablet, Take 1 tablet (40 mg total) by mouth 2 (two) times daily., Disp: 90 tablet, Rfl: 3   SYMBICORT 160-4.5 MCG/ACT inhaler, Inhale 2 puffs into the lungs in the morning and at bedtime., Disp: , Rfl:   Current Facility-Administered Medications:    0.9 %  sodium chloride infusion, 500 mL, Intravenous, Once, PeIrene ShipperMD   Objective:     Vitals:   02/17/22 1521  BP: 126/68  Weight: 214 lb (97.1 kg)  Height: '5\' 2"'$  (1.575  m)      Body mass index is 39.14 kg/m.    Physical Exam:    Gen: Appears well, nad, nontoxic and pleasant Neuro:sensation intact, strength is 5/5 with df/pf/inv/ev, muscle tone wnl Skin: no suspicious lesion or defmority Psych: A&O, appropriate mood and affect  Right shoulder: no deformity, swelling or muscle wasting No scapular winging FF 180 with painful arc, abd 180, int 0, ext 90 NTTP over the [place], clavicle, ac, coracoid, biceps groove, humerus, deltoid, trapezius, cervical spine Positive  Hawking's.  Equivocal O'Brien's Neg neer,  , empty can, subscap liftoff, speeds,  , crossarm Neg ant drawer, sulcus sign, apprehension Negative Spurling's test bilat FROM of neck    Electronically signed by:  Benito Mccreedy D.[facility] 3:46 PM 02/17/22

## 2022-02-17 ENCOUNTER — Ambulatory Visit: Payer: BC Managed Care – PPO | Admitting: Sports Medicine

## 2022-02-17 ENCOUNTER — Ambulatory Visit (INDEPENDENT_AMBULATORY_CARE_PROVIDER_SITE_OTHER): Payer: BC Managed Care – PPO

## 2022-02-17 VITALS — BP 126/68 | Ht 62.0 in | Wt 214.0 lb

## 2022-02-17 DIAGNOSIS — M25811 Other specified joint disorders, right shoulder: Secondary | ICD-10-CM | POA: Diagnosis not present

## 2022-02-17 DIAGNOSIS — M25511 Pain in right shoulder: Secondary | ICD-10-CM | POA: Diagnosis not present

## 2022-02-17 DIAGNOSIS — G8929 Other chronic pain: Secondary | ICD-10-CM

## 2022-02-17 NOTE — Patient Instructions (Addendum)
Good to see you  Shoulder HEP Referral PT  3-4 week follow up

## 2022-02-22 ENCOUNTER — Ambulatory Visit
Admission: RE | Admit: 2022-02-22 | Discharge: 2022-02-22 | Disposition: A | Discharge status: Home / Self Care | Payer: BC Managed Care – PPO | Source: Ambulatory Visit | Attending: Nurse Practitioner | Admitting: Nurse Practitioner

## 2022-02-22 DIAGNOSIS — K760 Fatty (change of) liver, not elsewhere classified: Secondary | ICD-10-CM

## 2022-02-22 DIAGNOSIS — B181 Chronic viral hepatitis B without delta-agent: Secondary | ICD-10-CM

## 2022-02-22 DIAGNOSIS — B1910 Unspecified viral hepatitis B without hepatic coma: Secondary | ICD-10-CM | POA: Diagnosis not present

## 2022-02-22 DIAGNOSIS — K7402 Hepatic fibrosis, advanced fibrosis: Secondary | ICD-10-CM

## 2022-02-25 ENCOUNTER — Telehealth: Payer: Self-pay | Admitting: Family

## 2022-02-25 ENCOUNTER — Ambulatory Visit: Payer: BC Managed Care – PPO | Admitting: Family

## 2022-02-25 ENCOUNTER — Encounter: Payer: Self-pay | Admitting: Family

## 2022-02-25 VITALS — BP 142/90 | HR 68 | Resp 18 | Ht 62.0 in | Wt 216.0 lb

## 2022-02-25 DIAGNOSIS — J45991 Cough variant asthma: Secondary | ICD-10-CM

## 2022-02-25 DIAGNOSIS — M545 Low back pain, unspecified: Secondary | ICD-10-CM

## 2022-02-25 DIAGNOSIS — B181 Chronic viral hepatitis B without delta-agent: Secondary | ICD-10-CM

## 2022-02-25 DIAGNOSIS — Z23 Encounter for immunization: Secondary | ICD-10-CM

## 2022-02-25 DIAGNOSIS — M25811 Other specified joint disorders, right shoulder: Secondary | ICD-10-CM | POA: Diagnosis not present

## 2022-02-25 DIAGNOSIS — E781 Pure hyperglyceridemia: Secondary | ICD-10-CM

## 2022-02-25 DIAGNOSIS — I10 Essential (primary) hypertension: Secondary | ICD-10-CM

## 2022-02-25 DIAGNOSIS — F418 Other specified anxiety disorders: Secondary | ICD-10-CM | POA: Diagnosis not present

## 2022-02-25 DIAGNOSIS — K7402 Hepatic fibrosis, advanced fibrosis: Secondary | ICD-10-CM

## 2022-02-25 HISTORY — DX: Hepatic fibrosis, advanced fibrosis: K74.02

## 2022-02-25 LAB — LIPID PANEL
Cholesterol: 147 mg/dL (ref 0–200)
HDL: 52.8 mg/dL (ref >39.00)
LDL Cholesterol: 78 mg/dL (ref 0–99)
NonHDL: 93.78
Total CHOL/HDL Ratio: 3
Triglycerides: 77 mg/dL (ref 0.0–149.0)
VLDL: 15.4 mg/dL (ref 0.0–40.0)

## 2022-02-25 LAB — COMPREHENSIVE METABOLIC PANEL
ALT: 25 U/L (ref 0–35)
AST: 17 U/L (ref 0–37)
Albumin: 4 g/dL (ref 3.5–5.2)
Alkaline Phosphatase: 64 U/L (ref 39–117)
BUN: 21 mg/dL (ref 6–23)
CO2: 28 mEq/L (ref 19–32)
Calcium: 9.3 mg/dL (ref 8.4–10.5)
Chloride: 106 mEq/L (ref 96–112)
Creatinine, Ser: 1.22 mg/dL — ABNORMAL HIGH (ref 0.40–1.20)
GFR: 51.03 mL/min — ABNORMAL LOW (ref >60.00)
Glucose, Bld: 79 mg/dL (ref 70–99)
Potassium: 4 mEq/L (ref 3.5–5.1)
Sodium: 142 mEq/L (ref 135–145)
Total Bilirubin: 0.5 mg/dL (ref 0.2–1.2)
Total Protein: 7.1 g/dL (ref 6.0–8.3)

## 2022-02-25 MED ORDER — AMLODIPINE BESYLATE 5 MG PO TABS: 5.0000 mg | ORAL_TABLET | Freq: Every day | ORAL | 0 refills | Status: DC

## 2022-02-25 NOTE — Assessment & Plan Note (Signed)
Followed by hepatology

## 2022-02-25 NOTE — Patient Instructions (Signed)
Please complete lab work prior to leaving.   

## 2022-02-25 NOTE — Assessment & Plan Note (Addendum)
BP Readings from Last 3 Encounters:  02/25/22 (!) 149/70  02/17/22 126/68  09/29/21 134/60   Maintained on amlodipine 2.5 mg. Was better last week. I have advised her to check bp once daily at home for the next few days and send me her readings via mychart.

## 2022-02-25 NOTE — Assessment & Plan Note (Signed)
This is being managed by Hepatology, Roosevelt Locks NP

## 2022-02-25 NOTE — Assessment & Plan Note (Signed)
Not regularly needing symbicort. Pantoprazole helps a lot with her cough.

## 2022-02-25 NOTE — Assessment & Plan Note (Signed)
Has seen sports med.  Requesting PT referral. Referral placed.

## 2022-02-25 NOTE — Telephone Encounter (Signed)
Please advise pt that her kidney function is mildly decreased. Because of this finding, I think we should be more strict on her blood pressure control. I would recommend that she increase amlodipine from 2.'5mg'$  to '5mg'$  once daily. Send me her home readings in 1 week.   Cholesterol looks great.

## 2022-02-25 NOTE — Assessment & Plan Note (Signed)
Has tried massage, nsaids. Pain persists. Will refer to PT.

## 2022-02-25 NOTE — Telephone Encounter (Signed)
Patient advised of results and amlodipine dose increase. Se will send her bp readings in one week.

## 2022-02-25 NOTE — Progress Notes (Signed)
Subjective:   By signing my name below, I, Brittany Johnson, attest that this documentation has been prepared under the direction and in the presence of Jeri Lager' Suvillivan, NP 02/25/2022     Patient ID: Brittany Johnson, female    DOB: 09/15/1970, 51 y.o.   MRN: [id number]  Chief Complaint  Patient presents with   Back Pain   skin check    Right armpit     HPI Patient is in today for an office visit   Lower Back Pain: She reports of back pain that occurred in 08/2021. She has went to a chiropractor and a massage therapist. Symptoms are improving but not resolved. Symptoms increase when she moves or rotates her body in a certain position. Symptoms are non radiating.  Lump Under Right Armpit: She reports of a lump under her right armpit that appeared in 09/2021. She states that symptoms are resolved . Immunizations: She is interested in receiving an influenza vaccine during today's visit Mood: She states that her mood is controlled. She is currently taking 10 Mg of Lexapro.  Blood Pressure: She is currently taking 2.5 Mg of Amlodipine. She has not taken the medication prior to today's visit.  BP Readings from Last 3 Encounters:  02/25/22 (!) 142/90  02/17/22 126/68  09/29/21 134/60   Pulse Readings from Last 3 Encounters:  02/25/22 68  09/29/21 78  07/06/21 75   Cough: She reports that her cough symptoms are improving. She is currently taking 40 Mg of Protonix. She does not use her Symbicort often. Cholesterol: Her cholesterol levels are normal Lab Results  Component Value Date   CHOL 171 03/31/2021   HDL 45.60 03/31/2021   LDLCALC 101 (H) 03/31/2021   LDLDIRECT 86.0 01/15/2019   TRIG 120.0 03/31/2021   CHOLHDL 4 03/31/2021   Kidney: Her kidney levels are slightly reduced.  Lab Results  Component Value Date   CREATININE 1.29 (H) 03/31/2021   BUN 19 03/31/2021   NA 140 03/31/2021   K 4.6 03/31/2021   CL 103 03/31/2021   CO2 31 03/31/2021   Health Maintenance Due   Topic Date Due   HIV Screening  Never done    Past Medical History:  Diagnosis Date   Abnormal cervical cytology 02/07/2011   ?   Advanced hepatic fibrosis 02/25/2022   Allergy    Anxiety    Bronchitis 05/25/2012   Chicken pox as a child   Chronic viral hepatitis B without delta agent and without coma ([place]) 02/07/2011   Treated during pregnancy, never symptomatic   Fibroid    GERD (gastroesophageal reflux disease)    Hepatitis B    Hx: UTI (urinary tract infection) 02/07/2011   Hypertension    in the past   Hypokalemia 05/25/2012   Left lateral epicondylitis 09/02/2011   Menorrhagia 11/24/2012   Obese 03/04/2011   Palpitations 07/08/2012   in the past   Psoriasis 09/02/2011   Right shoulder strain 03/29/2012   Shingles    Tinea pedis of right foot 03/07/2011    Past Surgical History:  Procedure Laterality Date   DILATION AND CURETTAGE OF UTERUS  2003   SAB   EYE SURGERY     INTRAUTERINE DEVICE (IUD) INSERTION     mirena inserted 05-16-16    Family History  Problem Relation Age of Onset   Diabetes Mother        type 2   Hypertension Mother    Hepatitis Mother  Hep B, died with liver issues   Heart attack Father        ? "infection on the outside of his heart"   Hypertension Father    Cancer Father        prostate   Cancer Paternal Grandmother        ?   Colon cancer Neg Hx    Colon polyps Neg Hx    Esophageal cancer Neg Hx    Stomach cancer Neg Hx    Rectal cancer Neg Hx    Breast cancer Neg Hx     Social History   Socioeconomic History   Marital status: Married    Spouse name: Not on file   Number of children: Not on file   Years of education: Not on file   Highest education level: Not on file  Occupational History   Not on file  Tobacco Use   Smoking status: Never   Smokeless tobacco: Never  Vaping Use   Vaping Use: Never used  Substance and Sexual Activity   Alcohol use: No   Drug use: No   Sexual activity: Yes    Partners: Male     Birth control/protection: I.U.D.  Other Topics Concern   Not on file  Social History Narrative   Married for 23 yrs   2 step children- grown   2001 Son   2005 daughter   Works as an Scientist, water quality at [employer]    Completed bachelor's degree   Enjoys outdoor activities, golf, reading   Restaurant manager, fast food   Social Determinants of Radio broadcast assistant Strain: Not on [employer] Insecurity: Not on file  Transportation Needs: Not on file  Physical Activity: Not on file  Stress: Not on file  Social Connections: Not on file  Intimate Partner Violence: Not on file    Outpatient Medications Prior to Visit  Medication Sig Dispense Refill   amLODipine (NORVASC) 2.5 MG tablet Take 1 tablet (2.5 mg total) by mouth daily. 90 tablet 1   betamethasone dipropionate (DIPROLENE) 0.05 % cream APPLY TO AFFECTED AREA TWICE A DAY 30 g 1   EPINEPHrine 0.3 mg/0.3 mL IJ SOAJ injection SMARTSIG:0.3 Milligram(s) IM Once PRN     escitalopram (LEXAPRO) 10 MG tablet Take 1 tablet (10 mg total) by mouth daily. 90 tablet 1   fluticasone (FLONASE) 50 MCG/ACT nasal spray SPRAY 1-2 SPRAYS IN EACH NOSTRIL ONCE A DAY     levocetirizine (XYZAL) 5 MG tablet TAKE 1 TABLET (5 MG TOTAL) BY MOUTH 2 (TWO) TIMES DAILY AS NEEDED FOR ALLERGIES. 60 tablet 5   levonorgestrel (MIRENA) 20 MCG/24HR IUD 1 each by Intrauterine route once.     meloxicam (MOBIC) 7.5 MG tablet Take 1 tablet (7.5 mg total) by mouth daily. 14 tablet 0   pantoprazole (PROTONIX) 40 MG tablet Take 1 tablet (40 mg total) by mouth 2 (two) times daily. 90 tablet 3   SYMBICORT 160-4.5 MCG/ACT inhaler Inhale 2 puffs into the lungs in the morning and at bedtime.     Facility-Administered Medications Prior to Visit  Medication Dose Route Frequency Provider Last Rate Last Admin   0.9 %  sodium chloride infusion  500 mL Intravenous Once Irene Shipper, MD        No Known Allergies  ROS    See HPI Objective:    Physical Exam Constitutional:       General: She is not in acute distress.    Appearance: Normal appearance.  She is not ill-appearing.  HENT:     Head: Normocephalic and atraumatic.     Right Ear: External ear normal.     Left Ear: External ear normal.  Eyes:     Extraocular Movements: Extraocular movements intact.     Pupils: Pupils are equal, round, and reactive to light.  Cardiovascular:     Rate and Rhythm: Normal rate and regular rhythm.     Heart sounds: Normal heart sounds. No murmur heard.    No gallop.  Pulmonary:     Effort: Pulmonary effort is normal. No respiratory distress.     Breath sounds: Normal breath sounds. No wheezing or rales.  Chest:  Breasts:    Breasts are symmetrical.     Right: No inverted nipple or mass.     Left: No inverted nipple or mass.  Skin:    General: Skin is warm and dry.  Neurological:     Mental Status: She is alert and oriented to person, place, and time.  Psychiatric:        Mood and Affect: Mood normal.        Behavior: Behavior normal.        Judgment: Judgment normal.     BP (!) 142/90   Pulse 68   Resp 18   Ht 5' 2"  (1.575 m)   Wt 216 lb (98 kg)   SpO2 92%   BMI 39.51 kg/m  Wt Readings from Last 3 Encounters:  02/25/22 216 lb (98 kg)  02/17/22 214 lb (97.1 kg)  09/29/21 214 lb (97.1 kg)       Assessment & Plan:   Problem List Items Addressed This Visit       Unprioritized   Midline low back pain without sciatica    Has tried massage, nsaids. Pain persists. Will refer to PT.       Relevant Orders   Ambulatory referral to Physical Therapy   Impingement of right shoulder    Has seen sports med.  Requesting PT referral. Referral placed.       Relevant Orders   Ambulatory referral to Physical Therapy   HTN (hypertension)    BP Readings from Last 3 Encounters:  02/25/22 (!) 149/70  02/17/22 126/68  09/29/21 134/60  Maintained on amlodipine 2.5 mg. Was better last week. I have advised her to check bp once daily at home for the next few  days and send me her readings via mychart.       Relevant Orders   Comp Met (CMET)   Depression with anxiety    Stable mood on lexapro. Continue same.      Cough variant asthma vs UACS     Not regularly needing symbicort. Pantoprazole helps a lot with her cough.       Chronic viral hepatitis B without delta agent and without coma ([place])    This is being managed by Hepatology, Roosevelt Locks NP      Advanced hepatic fibrosis    Followed by hepatology.       Other Visit Diagnoses     Need for influenza vaccination    -  Primary   Relevant Orders   Flu Vaccine QUAD 51moIM (Fluarix, Fluzone & Alfiuria Quad PF) (Completed)   Hypertriglyceridemia       Relevant Orders   Lipid panel      No orders of the defined types were placed in this encounter.   I, MNance Pear NP, personally preformed the services described in  this documentation.  All medical record entries made by the scribe were at my direction and in my presence.  I have reviewed the chart and discharge instructions (if applicable) and agree that the record reflects my personal performance and is accurate and complete. 02/25/2022   I,Amber Collins,acting as a scribe for Nance Pear, NP.,have documented all relevant documentation on the behalf of Nance Pear, NP,as directed by  Nance Pear, NP while in the presence of Nance Pear, NP.    Nance Pear, NP

## 2022-02-25 NOTE — Assessment & Plan Note (Signed)
Stable mood on lexapro. Continue same.

## 2022-02-28 DIAGNOSIS — R635 Abnormal weight gain: Secondary | ICD-10-CM | POA: Diagnosis not present

## 2022-02-28 DIAGNOSIS — Z6839 Body mass index (BMI) 39.0-39.9, adult: Secondary | ICD-10-CM | POA: Diagnosis not present

## 2022-03-10 ENCOUNTER — Telehealth: Payer: Self-pay | Admitting: Family

## 2022-03-10 NOTE — Telephone Encounter (Signed)
See mychart.  

## 2022-03-11 ENCOUNTER — Other Ambulatory Visit: Payer: Self-pay

## 2022-03-11 ENCOUNTER — Ambulatory Visit: Payer: BC Managed Care – PPO | Attending: Sports Medicine

## 2022-03-11 DIAGNOSIS — M25511 Pain in right shoulder: Secondary | ICD-10-CM | POA: Insufficient documentation

## 2022-03-11 DIAGNOSIS — M25811 Other specified joint disorders, right shoulder: Secondary | ICD-10-CM | POA: Diagnosis not present

## 2022-03-11 DIAGNOSIS — G8929 Other chronic pain: Secondary | ICD-10-CM | POA: Insufficient documentation

## 2022-03-11 DIAGNOSIS — M6281 Muscle weakness (generalized): Secondary | ICD-10-CM | POA: Diagnosis not present

## 2022-03-11 NOTE — Therapy (Addendum)
OUTPATIENT PHYSICAL THERAPY SHOULDER EVALUATION/Discharge   Patient Name: Brittany Johnson MRN: [id number] DOB:[birth year], 51 y.o., female Today's Date: 03/11/2022    Past Medical History:  Diagnosis Date   Abnormal cervical cytology 02/07/2011   ?   Advanced hepatic fibrosis 02/25/2022   Allergy    Anxiety    Bronchitis 05/25/2012   Chicken pox as a child   Chronic viral hepatitis B without delta agent and without coma ([place]) 02/07/2011   Treated during pregnancy, never symptomatic   Fibroid    GERD (gastroesophageal reflux disease)    Hepatitis B    Hx: UTI (urinary tract infection) 02/07/2011   Hypertension    in the past   Hypokalemia 05/25/2012   Left lateral epicondylitis 09/02/2011   Menorrhagia 11/24/2012   Obese 03/04/2011   Palpitations 07/08/2012   in the past   Psoriasis 09/02/2011   Right shoulder strain 03/29/2012   Shingles    Tinea pedis of right foot 03/07/2011   Past Surgical History:  Procedure Laterality Date   DILATION AND CURETTAGE OF UTERUS  2003   SAB   EYE SURGERY     INTRAUTERINE DEVICE (IUD) INSERTION     mirena inserted 05-16-16   Patient Active Problem List   Diagnosis Date Noted   Advanced hepatic fibrosis 02/25/2022   Midline low back pain without sciatica 02/25/2022   Impingement of right shoulder 02/25/2022   Lateral epicondylitis of right elbow 09/29/2021   Urinary incontinence 03/31/2021   Depression with anxiety 03/02/2021   Upper airway cough syndrome 12/19/2017   Cough variant asthma vs UACS  12/19/2017   Psoriasis 10/06/2017   Onychomycosis 01/08/2016   Vitamin D deficiency 10/09/2015   Preventative health care 06/26/2015   Nonallopathic lesion-rib cage 09/12/2013   Menorrhagia 11/24/2012   Hypokalemia 05/25/2012   Eczema 09/02/2011   Left lateral epicondylitis 09/02/2011   Tinea pedis of right foot 03/07/2011   Morbid obesity ([place]) 03/04/2011   Chronic viral hepatitis B without delta agent and without coma ([place])  02/07/2011   Abnormal cervical cytology 02/07/2011   HTN (hypertension) 02/07/2011   GERD 08/14/2008   CHEST PAIN, ATYPICAL 08/14/2008    PCP: [name], NP  REFERRING PROVIDER: Glennon Mac, DO; [name], NP  REFERRING DIAG:  Glennon Mac, DO  M25.511,G89.29 (ICD-10-CM) - Chronic right shoulder pain  M25.811 (ICD-10-CM) - Impingement of right shoulder   [name], NP  M54.50 (ICD-10-CM) - Midline low back pain without sciatica, unspecified chronicity  M25.811 (ICD-10-CM) - Impingement of right shoulder  THERAPY DIAG:  No diagnosis found.  Rationale for Evaluation and Treatment Rehabilitation  ONSET DATE: Chronic, wrose since Feb/Mar  SUBJECTIVE:  SUBJECTIVE STATEMENT: Pt reports her low back is not bothering her as much currently and requested her R shoulder being assessed first. Pt reports a approx +3 year hx of R shoulder issues. A few years ago received a shot which helped some, but the pain feels different now. Pt is R handed. Pt notes she may have injured her shoulder when she slipped down steps. Pt states the pain is not getting worse, but is not improving.   PERTINENT HISTORY: Obese  PAIN:  Are you having pain? Yes: NPRS scale: 4/10 Pain location: R sup GH area, sometimes to the elbow Pain description: sharp Aggravating factors: Lifting out ot the side, IR with holding arm out in front  Relieving factors: Rest and ice , lifting hand over head with elbow flexed vs. straight Initial pain range: 0-6/10  PRECAUTIONS: None  WEIGHT BEARING RESTRICTIONS   FALLS:  Has patient fallen in last 6 months? No  LIVING ENVIRONMENT: Lives with: lives with their family Lives in: House/apartment No issues  OCCUPATION: Office type work  PLOF:  Independent  PATIENT GOALS To be pain , likes to hike, kayaking, fishing   OBJECTIVE:   DIAGNOSTIC FINDINGS:  IMPRESSION: Negative.     Electronically Signed   By: Logan Bores M.D.   On: 02/19/2022 12:55  PATIENT SURVEYS:  FOTO TBA next PT visit  COGNITION:  Overall cognitive status: Within functional limits for tasks assessed     SENSATION: WFL  POSTURE: Forward head c min rounded shoulders  UPPER EXTREMITY ROM:   R shoulder AROM equal to L Active ROM Right eval Left eval  Shoulder flexion 150 pain near end range   Shoulder extension 155 pain near end range   Shoulder abduction    Shoulder adduction    Shoulder internal rotation T9   Shoulder external rotation T3   Elbow flexion    Elbow extension    Wrist flexion    Wrist extension    Wrist ulnar deviation    Wrist radial deviation    Wrist pronation    Wrist supination    (Blank rows = not tested)  UPPER EXTREMITY MMT:  MMT Right eval Left eval  Shoulder flexion 5   Shoulder extension 5   Shoulder abduction 5   Shoulder adduction 5   Shoulder internal rotation 5   Shoulder external rotation 4+ pain   Middle trapezius    Lower trapezius    Elbow flexion    Elbow extension    Wrist flexion    Wrist extension    Wrist ulnar deviation    Wrist radial deviation    Wrist pronation    Wrist supination    Grip strength (lbs)    (Blank rows = not tested)  SHOULDER SPECIAL TESTS:  Impingement tests: Hawkins/Kennedy impingement test: positive   SLAP lesions: Crank test: positive  c popping  Rotator cuff assessment: Empty can test: positive  and Full can test: negative  Biceps assessment: Yergason's test: negative  JOINT MOBILITY TESTING:  NT  PALPATION:  TTP to the superior/lateral Nicolaus joint aspect   TODAY'S TREATMENT:  [facility] Adult PT Treatment:                                                DATE: 03/11/22 Therapeutic Exercise: Standing Shoulder Row 10 reps - 3 hold GTB Shoulder extension  with resistance 10 reps - 3 hold GTB Shoulder External Rotation walk outs 8 reps RTB  Self Care: Continue cold packs and rest  PATIENT EDUCATION: Education details: Eval findings, POC, HEP, self care Person educated: Patient Education method: Explanation, Demonstration, Tactile cues, Verbal cues, and Handouts Education comprehension: verbalized understanding, returned demonstration, verbal cues required, and tactile cues required   HOME EXERCISE PROGRAM: Access Code: [email or web address] URL: [email or web address] Date: 03/11/2022 Prepared by: Gar Ponto  Exercises - Standing Shoulder Row with Anchored Resistance  - 1 x daily - 7 x weekly - 2 sets - 10 reps - 3 hold - Shoulder extension with resistance - Neutral  - 1 x daily - 7 x weekly - 2 sets - 10 reps - 3 hold - Shoulder External Rotation with Anchored Resistance  - 1 x daily - 7 x weekly - 2 sets - 8 reps  ASSESSMENT:  CLINICAL IMPRESSION: Patient is a 51 y.o. female who was seen today for physical therapy evaluation and treatment for R shoulder impingement. Pt's symptoms are consistent impingement, ER RC tendinopathy, and possible issue with labrum.  OBJECTIVE IMPAIRMENTS decreased strength, impaired UE functional use, obesity, and pain.   ACTIVITY LIMITATIONS lifting, sleeping, and reach over head  PARTICIPATION LIMITATIONS: cleaning and recreation activities of kayaking and fishing  PERSONAL FACTORS Time since onset of injury/illness/exacerbation and 1 comorbidity: obese are also affecting patient's functional outcome.   REHAB POTENTIAL: Good  CLINICAL DECISION MAKING: Evolving/moderate complexity  EVALUATION COMPLEXITY: Moderate   GOALS:  SHORT TERM GOALS: Target date: 03/25/2022   Pt will be Ind in an initial HEP Baseline: initiated Goal status: INITIAL  2.  Pt will voice understanding of measures to assist in pain reduction Baseline: initiated Goal status: INITIAL  LONG TERM GOALS: Target date:  05/13/22  Pt will be Ind in a final HEP to maintain achieved LOF  Baseline: initiated Goal status: INITIAL  2.  Pt will be able to reach [facility] fully with 1/10 provacation of pain Baseline: pain near end range Goal status: INITIAL  3.  Pt will demonstrae 5/5 R shoulder ER strength with 1/10 provacation of pain Baseline: painful Goal status: INITIAL  4.  Pt will report a reduction of R shoulder ain with daily activities to a range of 0-2/10 Baseline: 0-6/10 Goal status: INITIAL  5.  Pt will report tolerating kayaking and /or fishing with r shoulder pain of 3/10 or less Baseline:  Goal status: INITIAL   PLAN: PT FREQUENCY: 1x/week  PT DURATION: 8 weeks  PLANNED INTERVENTIONS: Therapeutic exercises, Therapeutic activity, Patient/Family education, Self Care, Joint mobilization, Dry Needling, Electrical stimulation, Cryotherapy, Moist heat, Taping, Ultrasound, Ionotophoresis 41m/ml Dexamethasone, Manual therapy, and Re-evaluation  PLAN FOR NEXT SESSION: Assess FOTO; assess response to HEP; progress therex as indicated; use of modalities, manual therapy; and TPDN as indicated.    Orma Cheetham MS, PT 03/11/22 1:40 PM  PHYSICAL THERAPY DISCHARGE SUMMARY  Visits from Start of Care: 1  Current functional level related to goals / functional outcomes: unknown   Remaining deficits: unknown   Education / Equipment: HEP   Patient agrees to discharge. Patient goals were not met. Patient is being discharged due to not returning since the last visit.  Brittany Lichter MS, PT 05/08/22 11:15 PM

## 2022-03-14 ENCOUNTER — Other Ambulatory Visit: Payer: Self-pay | Admitting: Family

## 2022-03-15 ENCOUNTER — Telehealth: Payer: Self-pay | Admitting: Physical Therapy

## 2022-03-15 ENCOUNTER — Ambulatory Visit: Payer: BC Managed Care – PPO | Attending: Sports Medicine | Admitting: Physical Therapy

## 2022-03-15 NOTE — Telephone Encounter (Signed)
Left voicemail regarding no-show to physical therapy appointment at 8 am. Left reminder of attendance policy as well as next appointment date and time.

## 2022-03-16 NOTE — Progress Notes (Deleted)
    Brittany Johnson D.[place] Phone: [phone]   Assessment and Plan:     There are no diagnoses linked to this encounter.  ***   Pertinent previous records reviewed include ***   Follow Up: ***     Subjective:   I, Brittany Johnson, am serving as a Education administrator for Doctor Glennon Mac   Chief Complaint: right shoulder pain    HPI:    02/17/2022 Patient is a 51 year old female complaining of right shoulder pain . Patient states that shoulder has hurt on and off for along time, PT states she feels like it was out of place, no numbness or tingling, has had a CSI before the symptoms where just different was told it was bursitis, no meds for the pain, has used ice , no radiating pain but has hx of elbow pain, decreased ROM ,   03/17/2022 Patient states   Relevant Historical Information: Hypertension  Additional pertinent review of systems negative.   Current Outpatient Medications:    amLODipine (NORVASC) 5 MG tablet, Take 1 tablet (5 mg total) by mouth daily., Disp: 90 tablet, Rfl: 0   betamethasone dipropionate (DIPROLENE) 0.05 % cream, APPLY TO AFFECTED AREA TWICE A DAY, Disp: 30 g, Rfl: 1   EPINEPHrine 0.3 mg/0.3 mL IJ SOAJ injection, SMARTSIG:0.3 Milligram(s) IM Once PRN, Disp: , Rfl:    escitalopram (LEXAPRO) 10 MG tablet, TAKE 1 TABLET BY MOUTH EVERY DAY, Disp: 90 tablet, Rfl: 1   fluticasone (FLONASE) 50 MCG/ACT nasal spray, SPRAY 1-2 SPRAYS IN EACH NOSTRIL ONCE A DAY, Disp: , Rfl:    levocetirizine (XYZAL) 5 MG tablet, TAKE 1 TABLET (5 MG TOTAL) BY MOUTH 2 (TWO) TIMES DAILY AS NEEDED FOR ALLERGIES., Disp: 60 tablet, Rfl: 5   levonorgestrel (MIRENA) 20 MCG/24HR IUD, 1 each by Intrauterine route once., Disp: , Rfl:    meloxicam (MOBIC) 7.5 MG tablet, Take 1 tablet (7.5 mg total) by mouth daily., Disp: 14 tablet, Rfl: 0   pantoprazole (PROTONIX) 40 MG tablet, Take 1 tablet (40 mg total) by mouth 2 (two)  times daily., Disp: 90 tablet, Rfl: 3   SYMBICORT 160-4.5 MCG/ACT inhaler, Inhale 2 puffs into the lungs in the morning and at bedtime., Disp: , Rfl:   Current Facility-Administered Medications:    0.9 %  sodium chloride infusion, 500 mL, Intravenous, Once, Irene Shipper, MD   Objective:     There were no vitals filed for this visit.    There is no height or weight on file to calculate BMI.    Physical Exam:    ***   Electronically signed by:  Brittany Johnson D.[facility] 7:37 AM 03/16/22

## 2022-03-17 ENCOUNTER — Ambulatory Visit: Payer: BC Managed Care – PPO | Admitting: Sports Medicine

## 2022-03-17 DIAGNOSIS — K760 Fatty (change of) liver, not elsewhere classified: Secondary | ICD-10-CM | POA: Diagnosis not present

## 2022-03-17 DIAGNOSIS — E669 Obesity, unspecified: Secondary | ICD-10-CM | POA: Diagnosis not present

## 2022-03-17 DIAGNOSIS — Z6839 Body mass index (BMI) 39.0-39.9, adult: Secondary | ICD-10-CM | POA: Diagnosis not present

## 2022-03-17 DIAGNOSIS — K219 Gastro-esophageal reflux disease without esophagitis: Secondary | ICD-10-CM | POA: Diagnosis not present

## 2022-03-17 DIAGNOSIS — I10 Essential (primary) hypertension: Secondary | ICD-10-CM | POA: Diagnosis not present

## 2022-03-18 ENCOUNTER — Ambulatory Visit: Payer: BC Managed Care – PPO

## 2022-04-04 ENCOUNTER — Encounter: Payer: Self-pay | Admitting: *Deleted

## 2022-04-04 ENCOUNTER — Encounter: Payer: BC Managed Care – PPO | Admitting: Family

## 2022-04-04 ENCOUNTER — Telehealth: Payer: Self-pay | Admitting: Family

## 2022-04-04 NOTE — Telephone Encounter (Signed)
Can you please send her a no show letter?

## 2022-04-04 NOTE — Telephone Encounter (Signed)
Letter sent.

## 2022-04-15 DIAGNOSIS — E669 Obesity, unspecified: Secondary | ICD-10-CM | POA: Diagnosis not present

## 2022-04-15 DIAGNOSIS — Z713 Dietary counseling and surveillance: Secondary | ICD-10-CM | POA: Diagnosis not present

## 2022-04-22 DIAGNOSIS — E669 Obesity, unspecified: Secondary | ICD-10-CM | POA: Diagnosis not present

## 2022-04-22 DIAGNOSIS — I10 Essential (primary) hypertension: Secondary | ICD-10-CM | POA: Diagnosis not present

## 2022-04-22 DIAGNOSIS — K219 Gastro-esophageal reflux disease without esophagitis: Secondary | ICD-10-CM | POA: Diagnosis not present

## 2022-04-22 DIAGNOSIS — K760 Fatty (change of) liver, not elsewhere classified: Secondary | ICD-10-CM | POA: Diagnosis not present

## 2022-04-29 DIAGNOSIS — I959 Hypotension, unspecified: Secondary | ICD-10-CM | POA: Diagnosis not present

## 2022-04-29 DIAGNOSIS — R0689 Other abnormalities of breathing: Secondary | ICD-10-CM | POA: Diagnosis not present

## 2022-04-29 DIAGNOSIS — R112 Nausea with vomiting, unspecified: Secondary | ICD-10-CM | POA: Diagnosis not present

## 2022-04-29 DIAGNOSIS — I10 Essential (primary) hypertension: Secondary | ICD-10-CM | POA: Diagnosis not present

## 2022-04-29 DIAGNOSIS — K529 Noninfective gastroenteritis and colitis, unspecified: Secondary | ICD-10-CM | POA: Diagnosis not present

## 2022-04-29 DIAGNOSIS — R42 Dizziness and giddiness: Secondary | ICD-10-CM | POA: Diagnosis not present

## 2022-04-29 DIAGNOSIS — R55 Syncope and collapse: Secondary | ICD-10-CM | POA: Diagnosis not present

## 2022-04-29 DIAGNOSIS — I951 Orthostatic hypotension: Secondary | ICD-10-CM | POA: Diagnosis not present

## 2022-04-29 DIAGNOSIS — R197 Diarrhea, unspecified: Secondary | ICD-10-CM | POA: Diagnosis not present

## 2022-05-10 ENCOUNTER — Encounter: Payer: Self-pay | Admitting: Family

## 2022-05-10 ENCOUNTER — Ambulatory Visit (INDEPENDENT_AMBULATORY_CARE_PROVIDER_SITE_OTHER): Payer: BC Managed Care – PPO | Admitting: Family

## 2022-05-10 VITALS — BP 145/93 | HR 81 | Temp 98.0°F | Resp 16 | Ht 62.0 in | Wt 217.0 lb

## 2022-05-10 DIAGNOSIS — I10 Essential (primary) hypertension: Secondary | ICD-10-CM | POA: Diagnosis not present

## 2022-05-10 DIAGNOSIS — Z0000 Encounter for general adult medical examination without abnormal findings: Secondary | ICD-10-CM

## 2022-05-10 DIAGNOSIS — F418 Other specified anxiety disorders: Secondary | ICD-10-CM | POA: Diagnosis not present

## 2022-05-10 MED ORDER — AMLODIPINE BESYLATE 5 MG PO TABS
7.5000 mg | ORAL_TABLET | Freq: Every day | ORAL | 0 refills | Status: DC
Start: 2022-05-10 — End: 2022-08-11

## 2022-05-10 NOTE — Progress Notes (Signed)
Subjective:   By signing my name below, I, Brittany Johnson, attest that this documentation has been prepared under the direction and in the presence of Brittany Lager' Suvillivan, NP 05/10/2022   Patient ID: Brittany Johnson, female    DOB: Oct 12, 1970, 51 y.o.   MRN: [id number]  No chief complaint on file.   HPI Patient is in today for a comprehensive physical exam  Birth Control: She currently has the 20 mcg/24 hr Mirena IUD for birth control. She does not have cycles. She denies of any significant hot flashes.   ED Admission: She states that she was admitted to the ED on 04/29/2022 for a stomach virus. Her blood work came out abnormal and believes that it could be due to dehydration. She states that during her time in the ED, her blood pressure was about 70/40 which she contributes to the dehydration.  BP Readings from Last 3 Encounters:  05/10/22 (!) 145/93  02/25/22 (!) 142/90  02/17/22 126/68   Joint Pain: She states that she has unusual joint pain daily and has been doing well on diet. However, she notes that once she consumes sugar, her joint pain appears.   Anxiety: She states that her anxiety is decreasing. She is requesting to taper off of her 10 mg of Lexapro.   She denies having any fever, hearing or vision symptoms, new muscle pain, joint pain , new moles, rashes, congestion, sinus pain, sore throat, palpations, cough, SOB ,wheezing,n/v/d constipation, blood in stool, dysuria, frequency, hematuria, depression, anxiety, headaches at this time  Social history: She reports of no recent surgeries. She denies of any changes to her family medical history.  Colonoscopy: Last completed on 10/25/2019 Pap Smear: Last completed on 03/03/2021 Mammogram: Last completed on 09/09/2021 Immunizations:  Diet: She was previously taking Phentermine to suppress her appetite. She has since stopped taking the medication  Health Maintenance Due  Topic Date Due   HIV Screening  Never done     Past Medical History:  Diagnosis Date   Abnormal cervical cytology 02/07/2011   ?   Advanced hepatic fibrosis 02/25/2022   Allergy    Anxiety    Bronchitis 05/25/2012   Chicken pox as a child   Chronic viral hepatitis B without delta agent and without coma ([place]) 02/07/2011   Treated during pregnancy, never symptomatic   Fibroid    GERD (gastroesophageal reflux disease)    Hepatitis B    Hx: UTI (urinary tract infection) 02/07/2011   Hypertension    in the past   Hypokalemia 05/25/2012   Left lateral epicondylitis 09/02/2011   Menorrhagia 11/24/2012   Obese 03/04/2011   Palpitations 07/08/2012   in the past   Psoriasis 09/02/2011   Right shoulder strain 03/29/2012   Shingles    Tinea pedis of right foot 03/07/2011    Past Surgical History:  Procedure Laterality Date   DILATION AND CURETTAGE OF UTERUS  2003   SAB   EYE SURGERY     INTRAUTERINE DEVICE (IUD) INSERTION     mirena inserted 05-16-16    Family History  Problem Relation Age of Onset   Diabetes Mother        type 2   Hypertension Mother    Hepatitis Mother        Hep B, died with liver issues   Heart attack Father        ? "infection on the outside of his heart"   Hypertension Father    Cancer Father  prostate   Cancer Paternal Grandmother        ?   Colon cancer Neg Hx    Colon polyps Neg Hx    Esophageal cancer Neg Hx    Stomach cancer Neg Hx    Rectal cancer Neg Hx    Breast cancer Neg Hx     Social History   Socioeconomic History   Marital status: Married    Spouse name: Not on file   Number of children: Not on file   Years of education: Not on file   Highest education level: Not on file  Occupational History   Not on file  Tobacco Use   Smoking status: Never   Smokeless tobacco: Never  Vaping Use   Vaping Use: Never used  Substance and Sexual Activity   Alcohol use: No   Drug use: No   Sexual activity: Yes    Partners: Male    Birth control/protection: I.U.D.  Other  Topics Concern   Not on file  Social History Narrative   Married for 23 yrs   2 step children- grown   2001 Son   2005 daughter   Works as an Scientist, water quality at [employer]    Completed bachelor's degree   Enjoys outdoor activities, golf, reading   Restaurant manager, fast food   Social Determinants of Radio broadcast assistant Strain: Not on [employer] Insecurity: Not on file  Transportation Needs: Not on file  Physical Activity: Not on file  Stress: Not on file  Social Connections: Not on file  Intimate Partner Violence: Not on file    Outpatient Medications Prior to Visit  Medication Sig Dispense Refill   amLODipine (NORVASC) 5 MG tablet Take 1 tablet (5 mg total) by mouth daily. 90 tablet 0   betamethasone dipropionate (DIPROLENE) 0.05 % cream APPLY TO AFFECTED AREA TWICE A DAY 30 g 1   EPINEPHrine 0.3 mg/0.3 mL IJ SOAJ injection SMARTSIG:0.3 Milligram(s) IM Once PRN     escitalopram (LEXAPRO) 10 MG tablet TAKE 1 TABLET BY MOUTH EVERY DAY 90 tablet 1   fluticasone (FLONASE) 50 MCG/ACT nasal spray SPRAY 1-2 SPRAYS IN EACH NOSTRIL ONCE A DAY     levocetirizine (XYZAL) 5 MG tablet TAKE 1 TABLET (5 MG TOTAL) BY MOUTH 2 (TWO) TIMES DAILY AS NEEDED FOR ALLERGIES. 60 tablet 5   levonorgestrel (MIRENA) 20 MCG/24HR IUD 1 each by Intrauterine route once.     meloxicam (MOBIC) 7.5 MG tablet Take 1 tablet (7.5 mg total) by mouth daily. 14 tablet 0   pantoprazole (PROTONIX) 40 MG tablet Take 1 tablet (40 mg total) by mouth 2 (two) times daily. 90 tablet 3   SYMBICORT 160-4.5 MCG/ACT inhaler Inhale 2 puffs into the lungs in the morning and at bedtime.     Facility-Administered Medications Prior to Visit  Medication Dose Route Frequency Provider Last Rate Last Admin   0.9 %  sodium chloride infusion  500 mL Intravenous Once Irene Shipper, MD        No Known Allergies  Review of Systems  Constitutional:  Negative for fever.  HENT:  Negative for congestion, sinus pain and sore throat.    Respiratory:  Negative for cough, shortness of breath and wheezing.   Cardiovascular:  Negative for palpitations.  Gastrointestinal:  Negative for blood in stool, constipation, diarrhea, nausea and vomiting.  Genitourinary:  Negative for dysuria, frequency and hematuria.  Musculoskeletal:  Positive for joint pain. Negative for myalgias.  Skin:  Negative  for rash.       (-) [place]  Neurological:  Negative for headaches.  Psychiatric/Behavioral:  Negative for depression. The patient is not nervous/anxious.        Objective:    Physical Exam Constitutional:      General: She is not in acute distress.    Appearance: Normal appearance. She is not ill-appearing.  HENT:     Head: Normocephalic and atraumatic.     Right Ear: Tympanic membrane, ear canal and external ear normal.     Left Ear: Tympanic membrane, ear canal and external ear normal.  Eyes:     Extraocular Movements: Extraocular movements intact.     Pupils: Pupils are equal, round, and reactive to light.  Cardiovascular:     Rate and Rhythm: Normal rate and regular rhythm.     Heart sounds: Normal heart sounds. No murmur heard.    No gallop.  Pulmonary:     Effort: Pulmonary effort is normal. No respiratory distress.     Breath sounds: Normal breath sounds. No wheezing or rales.  Abdominal:     General: Bowel sounds are normal. There is no distension.     Palpations: Abdomen is soft.     Tenderness: There is no abdominal tenderness. There is no guarding.  Musculoskeletal:     Comments: 5/5 strength in both upper and lower extremities    Skin:    General: Skin is warm and dry.  Neurological:     Mental Status: She is alert and oriented to person, place, and time.     Deep Tendon Reflexes:     Reflex Scores:      Patellar reflexes are 2+ on the right side and 2+ on the left side. Psychiatric:        Mood and Affect: Mood normal.        Behavior: Behavior normal.        Judgment: Judgment normal.     There  were no vitals taken for this visit. Wt Readings from Last 3 Encounters:  02/25/22 216 lb (98 kg)  02/17/22 214 lb (97.1 kg)  09/29/21 214 lb (97.1 kg)       Assessment & Plan:   Problem List Items Addressed This Visit   None  No orders of the defined types were placed in this encounter.   I, Brittany Johnson, personally preformed the services described in this documentation.  All medical record entries made by the scribe were at my direction and in my presence.  I have reviewed the chart and discharge instructions (if applicable) and agree that the record reflects my personal performance and is accurate and complete. 05/10/2022   I,Amber Collins,acting as a scribe for Nance Pear, NP.,have documented all relevant documentation on the behalf of Nance Pear, NP,as directed by  Nance Pear, NP while in the presence of Nance Pear, NP.    [employer]

## 2022-05-10 NOTE — Assessment & Plan Note (Signed)
Will cut lexapro in 1/2 and take 1 have tab daily for 1 tab once daily for 1 month and she will let me know how she does. Wishes to try to come off.

## 2022-05-11 ENCOUNTER — Other Ambulatory Visit (INDEPENDENT_AMBULATORY_CARE_PROVIDER_SITE_OTHER): Payer: BC Managed Care – PPO

## 2022-05-11 DIAGNOSIS — Z0000 Encounter for general adult medical examination without abnormal findings: Secondary | ICD-10-CM | POA: Diagnosis not present

## 2022-05-11 LAB — LIPID PANEL
Cholesterol: 156 mg/dL (ref 0–200)
HDL: 43.1 mg/dL (ref >39.00)
LDL Cholesterol: 87 mg/dL (ref 0–99)
NonHDL: 112.78
Total CHOL/HDL Ratio: 4
Triglycerides: 127 mg/dL (ref 0.0–149.0)
VLDL: 25.4 mg/dL (ref 0.0–40.0)

## 2022-05-11 LAB — COMPREHENSIVE METABOLIC PANEL
ALT: 24 U/L (ref 0–35)
AST: 21 U/L (ref 0–37)
Albumin: 4.3 g/dL (ref 3.5–5.2)
Alkaline Phosphatase: 53 U/L (ref 39–117)
BUN: 16 mg/dL (ref 6–23)
CO2: 30 mEq/L (ref 19–32)
Calcium: 9.4 mg/dL (ref 8.4–10.5)
Chloride: 103 mEq/L (ref 96–112)
Creatinine, Ser: 1.03 mg/dL (ref 0.40–1.20)
GFR: 62.43 mL/min (ref >60.00)
Glucose, Bld: 98 mg/dL (ref 70–99)
Potassium: 4.3 mEq/L (ref 3.5–5.1)
Sodium: 140 mEq/L (ref 135–145)
Total Bilirubin: 0.7 mg/dL (ref 0.2–1.2)
Total Protein: 7.3 g/dL (ref 6.0–8.3)

## 2022-05-11 LAB — CBC WITH DIFFERENTIAL/PLATELET
Basophils Absolute: 0 10*3/uL (ref 0.0–0.1)
Basophils Relative: 0.6 % (ref 0.0–3.0)
Eosinophils Absolute: 0.2 10*3/uL (ref 0.0–0.7)
Eosinophils Relative: 2 % (ref 0.0–5.0)
HCT: 44.1 % (ref 36.0–46.0)
Hemoglobin: 15.3 g/dL — ABNORMAL HIGH (ref 12.0–15.0)
Lymphocytes Relative: 23.2 % (ref 12.0–46.0)
Lymphs Abs: 1.8 10*3/uL (ref 0.7–4.0)
MCHC: 34.6 g/dL (ref 30.0–36.0)
MCV: 87.6 fl (ref 78.0–100.0)
Monocytes Absolute: 0.5 10*3/uL (ref 0.1–1.0)
Monocytes Relative: 6.3 % (ref 3.0–12.0)
Neutro Abs: 5.3 10*3/uL (ref 1.4–7.7)
Neutrophils Relative %: 67.9 % (ref 43.0–77.0)
Platelets: 227 10*3/uL (ref 150.0–400.0)
RBC: 5.04 Mil/uL (ref 3.87–5.11)
RDW: 13.5 % (ref 11.5–15.5)
WBC: 7.7 10*3/uL (ref 4.0–10.5)

## 2022-05-11 LAB — TSH: TSH: 1.57 u[IU]/mL (ref 0.35–5.50)

## 2022-05-11 NOTE — Assessment & Plan Note (Addendum)
Discussed healthy diet, exercise and weight loss. Pap/colo up to date. Mammo up to date. Flu shot and tetanus shot up to date. Declines covid vaccinations.

## 2022-05-11 NOTE — Assessment & Plan Note (Signed)
Uncontrolled. Will increase amlodipine from '5mg'$  to 7.'5mg'$ .

## 2022-05-26 DIAGNOSIS — E669 Obesity, unspecified: Secondary | ICD-10-CM | POA: Diagnosis not present

## 2022-05-26 DIAGNOSIS — I10 Essential (primary) hypertension: Secondary | ICD-10-CM | POA: Diagnosis not present

## 2022-05-26 DIAGNOSIS — K760 Fatty (change of) liver, not elsewhere classified: Secondary | ICD-10-CM | POA: Diagnosis not present

## 2022-06-12 DIAGNOSIS — R051 Acute cough: Secondary | ICD-10-CM | POA: Diagnosis not present

## 2022-06-15 DIAGNOSIS — K760 Fatty (change of) liver, not elsewhere classified: Secondary | ICD-10-CM | POA: Diagnosis not present

## 2022-06-15 DIAGNOSIS — K7402 Hepatic fibrosis, advanced fibrosis: Secondary | ICD-10-CM | POA: Diagnosis not present

## 2022-06-15 DIAGNOSIS — B181 Chronic viral hepatitis B without delta-agent: Secondary | ICD-10-CM | POA: Diagnosis not present

## 2022-06-17 ENCOUNTER — Ambulatory Visit: Payer: BC Managed Care – PPO | Admitting: Family

## 2022-06-17 VITALS — BP 118/70 | HR 56 | Resp 18 | Ht 62.0 in | Wt 212.0 lb

## 2022-06-17 DIAGNOSIS — F418 Other specified anxiety disorders: Secondary | ICD-10-CM

## 2022-06-17 DIAGNOSIS — I10 Essential (primary) hypertension: Secondary | ICD-10-CM | POA: Diagnosis not present

## 2022-06-17 NOTE — Progress Notes (Signed)
Subjective:   By signing my name below, I, [name], attest that this documentation has been prepared under the direction and in the presence of Nance Pear, NP 06/17/22    Patient ID: Brittany Johnson, female    DOB: 1971/01/07, 52 y.o.   MRN: [id number]  Chief Complaint  Patient presents with   Follow-up    HPI Patient is in today for 1 month follow up.  Depression with anxiety: At her last visit we discussed cutting her Lexapro dose in half to '5mg'$  once daily until our appointment today. She states that her mood has been fine with this reduction. She would like to continue to wean off of the Lexapro. She denies any SI or HI.  Hypertension: At our last visit on 04/2822, we increased er Amlodipine from '5mg'$  to 7.'5mg'$ . Her blood pressure has been well controlled with this change. BP Readings from Last 5 Encounters:  06/17/22 118/70  05/10/22 (!) 145/93  02/25/22 (!) 142/90  02/17/22 126/68  09/29/21 134/60     Past Medical History:  Diagnosis Date   Abnormal cervical cytology 02/07/2011   ?   Advanced hepatic fibrosis 02/25/2022   Allergy    Anxiety    Bronchitis 05/25/2012   Chicken pox as a child   Chronic viral hepatitis B without delta agent and without coma ([place]) 02/07/2011   Treated during pregnancy, never symptomatic   Fibroid    GERD (gastroesophageal reflux disease)    Hepatitis B    Hx: UTI (urinary tract infection) 02/07/2011   Hypertension    in the past   Hypokalemia 05/25/2012   Left lateral epicondylitis 09/02/2011   Menorrhagia 11/24/2012   Obese 03/04/2011   Palpitations 07/08/2012   in the past   Psoriasis 09/02/2011   Right shoulder strain 03/29/2012   Shingles    Tinea pedis of right foot 03/07/2011    Past Surgical History:  Procedure Laterality Date   DILATION AND CURETTAGE OF UTERUS  2003   SAB   EYE SURGERY     INTRAUTERINE DEVICE (IUD) INSERTION     mirena inserted 05-16-16    Family History  Problem Relation Age of Onset    Diabetes Mother        type 2   Hypertension Mother    Hepatitis Mother        Hep B, died with liver issues   Heart attack Father        ? "infection on the outside of his heart"   Hypertension Father    Cancer Father        prostate   Cancer Paternal Grandmother        ?   Colon cancer Neg Hx    Colon polyps Neg Hx    Esophageal cancer Neg Hx    Stomach cancer Neg Hx    Rectal cancer Neg Hx    Breast cancer Neg Hx     Social History   Socioeconomic History   Marital status: Married    Spouse name: Not on file   Number of children: Not on file   Years of education: Not on file   Highest education level: Not on file  Occupational History   Not on file  Tobacco Use   Smoking status: Never   Smokeless tobacco: Never  Vaping Use   Vaping Use: Never used  Substance and Sexual Activity   Alcohol use: No   Drug use: No   Sexual activity: Yes  Partners: Male    Birth control/protection: I.U.D.  Other Topics Concern   Not on file  Social History Narrative   Married for 23 yrs   2 step children- grown   2001 Son   2005 daughter   Works as an Scientist, water quality at [employer]    Completed bachelor's degree   Enjoys outdoor activities, golf, reading   Restaurant manager, fast food   Social Determinants of Radio broadcast assistant Strain: Not on [employer] Insecurity: Not on file  Transportation Needs: Not on file  Physical Activity: Not on file  Stress: Not on file  Social Connections: Not on file  Intimate Partner Violence: Not on file    Outpatient Medications Prior to Visit  Medication Sig Dispense Refill   amLODipine (NORVASC) 5 MG tablet Take 1.5 tablets (7.5 mg total) by mouth daily. 135 tablet 0   betamethasone dipropionate (DIPROLENE) 0.05 % cream APPLY TO AFFECTED AREA TWICE A DAY 30 g 1   EPINEPHrine 0.3 mg/0.3 mL IJ SOAJ injection SMARTSIG:0.3 Milligram(s) IM Once PRN     escitalopram (LEXAPRO) 10 MG tablet TAKE 1 TABLET BY MOUTH EVERY DAY 90 tablet  1   fluticasone (FLONASE) 50 MCG/ACT nasal spray SPRAY 1-2 SPRAYS IN EACH NOSTRIL ONCE A DAY     levocetirizine (XYZAL) 5 MG tablet TAKE 1 TABLET (5 MG TOTAL) BY MOUTH 2 (TWO) TIMES DAILY AS NEEDED FOR ALLERGIES. 60 tablet 5   levonorgestrel (MIRENA) 20 MCG/24HR IUD 1 each by Intrauterine route once.     meloxicam (MOBIC) 7.5 MG tablet Take 1 tablet (7.5 mg total) by mouth daily. 14 tablet 0   pantoprazole (PROTONIX) 40 MG tablet Take 1 tablet (40 mg total) by mouth 2 (two) times daily. 90 tablet 3   SYMBICORT 160-4.5 MCG/ACT inhaler Inhale 2 puffs into the lungs in the morning and at bedtime.     Facility-Administered Medications Prior to Visit  Medication Dose Route Frequency Provider Last Rate Last Admin   0.9 %  sodium chloride infusion  500 mL Intravenous Once Irene Shipper, MD        No Known Allergies  Review of Systems  Constitutional:  Negative for fever and malaise/fatigue.  HENT:  Negative for congestion.   Eyes:  Negative for blurred vision.  Respiratory:  Negative for shortness of breath.   Cardiovascular:  Negative for chest pain, palpitations and leg swelling.  Gastrointestinal:  Negative for abdominal pain, blood in stool and nausea.  Genitourinary:  Negative for dysuria and frequency.  Musculoskeletal:  Negative for falls.  Skin:  Negative for rash.  Neurological:  Negative for dizziness, loss of consciousness and headaches.  Endo/Heme/Allergies:  Negative for environmental allergies.  Psychiatric/Behavioral:  Negative for depression. The patient is not nervous/anxious.        Objective:    Physical Exam Vitals and nursing note reviewed.  Constitutional:      General: She is not in acute distress.    Appearance: She is well-developed.  HENT:     Head: Normocephalic and atraumatic.     Nose: Nose normal.  Eyes:     General:        Right eye: No discharge.        Left eye: No discharge.  Cardiovascular:     Rate and Rhythm: Normal rate and regular rhythm.      Heart sounds: No murmur heard. Pulmonary:     Effort: Pulmonary effort is normal.     Breath sounds:  Normal breath sounds.  Abdominal:     General: Bowel sounds are normal.     Palpations: Abdomen is soft.     Tenderness: There is no abdominal tenderness.  Musculoskeletal:     Cervical back: Normal range of motion and neck supple.  Skin:    General: Skin is warm and dry.  Neurological:     Mental Status: She is alert and oriented to person, place, and time.     BP 118/70   Pulse (!) 56   Resp 18   Ht '5\' 2"'$  (1.575 m)   Wt 212 lb (96.2 kg)   SpO2 98%   BMI 38.78 kg/m  Wt Readings from Last 3 Encounters:  06/17/22 212 lb (96.2 kg)  05/10/22 217 lb (98.4 kg)  02/25/22 216 lb (98 kg)       Assessment & Plan:  Hypertension, unspecified type Assessment & Plan: BP Readings from Last 3 Encounters:  06/17/22 118/70  05/10/22 (!) 145/93  02/25/22 (!) 142/90   BP looks great today. Continue amlodipine 7.'5mg'$  once daily.    Depression with anxiety Assessment & Plan: Reports stable mood with decrease in lexapro from '10mg'$  to '5mg'$ .  She wished to come off completely. Recommended that she continue lexapro '5mg'$  QOD for 1-2 weeks then stop. Send me an update in a few weeks to let me know how she is doing off of the medicine.       I,Alexis Herring,acting as a Education administrator for [employer], NP.,have documented all relevant documentation on the behalf of Nance Pear, NP,as directed by  Nance Pear, NP while in the presence of Nance Pear, NP.   I, Nance Pear, NP, personally preformed the services described in this documentation.  All medical record entries made by the scribe were at my direction and in my presence.  I have reviewed the chart and discharge instructions (if applicable) and agree that the record reflects my personal performance and is accurate and complete. '@TODAY'$ @   Nance Pear, NP

## 2022-06-17 NOTE — Assessment & Plan Note (Signed)
BP Readings from Last 3 Encounters:  06/17/22 118/70  05/10/22 (!) 145/93  02/25/22 (!) 142/90   BP looks great today. Continue amlodipine 7.'5mg'$  once daily.

## 2022-06-17 NOTE — Assessment & Plan Note (Addendum)
Reports stable mood with decrease in lexapro from '10mg'$  to '5mg'$ .  She wished to come off completely. Recommended that she continue lexapro '5mg'$  QOD for 1-2 weeks then stop. Send me an update in a few weeks to let me know how she is doing off of the medicine.

## 2022-06-23 DIAGNOSIS — Z713 Dietary counseling and surveillance: Secondary | ICD-10-CM | POA: Diagnosis not present

## 2022-06-23 DIAGNOSIS — E669 Obesity, unspecified: Secondary | ICD-10-CM | POA: Diagnosis not present

## 2022-07-15 ENCOUNTER — Other Ambulatory Visit: Payer: Self-pay | Admitting: Internal Medicine

## 2022-07-20 ENCOUNTER — Encounter: Payer: Self-pay | Admitting: Family

## 2022-07-20 MED ORDER — ESCITALOPRAM OXALATE 10 MG PO TABS: 5.0000 mg | ORAL_TABLET | Freq: Every day | ORAL | 1 refills | Status: DC

## 2022-08-09 DIAGNOSIS — K219 Gastro-esophageal reflux disease without esophagitis: Secondary | ICD-10-CM | POA: Diagnosis not present

## 2022-08-09 DIAGNOSIS — I10 Essential (primary) hypertension: Secondary | ICD-10-CM | POA: Diagnosis not present

## 2022-08-09 DIAGNOSIS — Z6837 Body mass index (BMI) 37.0-37.9, adult: Secondary | ICD-10-CM | POA: Diagnosis not present

## 2022-08-09 DIAGNOSIS — R635 Abnormal weight gain: Secondary | ICD-10-CM | POA: Diagnosis not present

## 2022-08-10 ENCOUNTER — Other Ambulatory Visit: Payer: Self-pay | Admitting: Nurse Practitioner

## 2022-08-10 DIAGNOSIS — B181 Chronic viral hepatitis B without delta-agent: Secondary | ICD-10-CM | POA: Diagnosis not present

## 2022-08-10 DIAGNOSIS — K7402 Hepatic fibrosis, advanced fibrosis: Secondary | ICD-10-CM

## 2022-08-10 DIAGNOSIS — K760 Fatty (change of) liver, not elsewhere classified: Secondary | ICD-10-CM | POA: Diagnosis not present

## 2022-08-11 ENCOUNTER — Other Ambulatory Visit: Payer: Self-pay | Admitting: Family

## 2022-08-19 ENCOUNTER — Other Ambulatory Visit: Payer: Self-pay | Admitting: Family

## 2022-08-19 ENCOUNTER — Other Ambulatory Visit: Payer: Self-pay | Admitting: Internal Medicine

## 2022-08-19 DIAGNOSIS — Z1231 Encounter for screening mammogram for malignant neoplasm of breast: Secondary | ICD-10-CM

## 2022-08-26 ENCOUNTER — Other Ambulatory Visit: Payer: Self-pay | Admitting: Family

## 2022-08-26 ENCOUNTER — Ambulatory Visit: Payer: BC Managed Care – PPO | Admitting: Family

## 2022-08-26 NOTE — Progress Notes (Incomplete)
Subjective:   By signing my name below, I, Brittany Johnson, attest that this documentation has been prepared under the direction and in the presence of Brittany Alar, NP. 08/26/2022.    Patient ID: Brittany Johnson, female    DOB: 11/30/1970, 52 y.o.   MRN: [id number]  No chief complaint on file.   HPI Patient is in today for an office visit.  Mood:  At her last visit 06/2022 we planned to continue 5 mg Lexapro for 1-2 weeks and then stop.    Hypertension:  BP Readings from Last 3 Encounters:  06/17/22 118/70  05/10/22 (!) 145/93  02/25/22 (!) 142/90     Past Medical History:  Diagnosis Date   Abnormal cervical cytology 02/07/2011   ?   Advanced hepatic fibrosis 02/25/2022   Allergy    Anxiety    Bronchitis 05/25/2012   Chicken pox as a child   Chronic viral hepatitis B without delta agent and without coma ([place]) 02/07/2011   Treated during pregnancy, never symptomatic   Fibroid    GERD (gastroesophageal reflux disease)    Hepatitis B    Hx: UTI (urinary tract infection) 02/07/2011   Hypertension    in the past   Hypokalemia 05/25/2012   Left lateral epicondylitis 09/02/2011   Menorrhagia 11/24/2012   Obese 03/04/2011   Palpitations 07/08/2012   in the past   Psoriasis 09/02/2011   Right shoulder strain 03/29/2012   Shingles    Tinea pedis of right foot 03/07/2011    Past Surgical History:  Procedure Laterality Date   DILATION AND CURETTAGE OF UTERUS  2003   SAB   EYE SURGERY     INTRAUTERINE DEVICE (IUD) INSERTION     mirena inserted 05-16-16    Family History  Problem Relation Age of Onset   Diabetes Mother        type 2   Hypertension Mother    Hepatitis Mother        Hep B, died with liver issues   Heart attack Father        ? "infection on the outside of his heart"   Hypertension Father    Cancer Father        prostate   Cancer Paternal Grandmother        ?   Colon cancer Neg Hx    Colon polyps Neg Hx    Esophageal cancer Neg Hx     Stomach cancer Neg Hx    Rectal cancer Neg Hx    Breast cancer Neg Hx     Social History   Socioeconomic History   Marital status: Married    Spouse name: Not on file   Number of children: Not on file   Years of education: Not on file   Highest education level: Not on file  Occupational History   Not on file  Tobacco Use   Smoking status: Never   Smokeless tobacco: Never  Vaping Use   Vaping Use: Never used  Substance and Sexual Activity   Alcohol use: No   Drug use: No   Sexual activity: Yes    Partners: Male    Birth control/protection: I.U.D.  Other Topics Concern   Not on file  Social History Narrative   Married for 23 yrs   2 step children- grown   2001 Son   2005 daughter   Works as an Scientist, water quality at [employer]    Completed bachelor's degree   Enjoys outdoor activities, golf,  reading   Restaurant manager, fast food   Social Determinants of Radio broadcast assistant Strain: Not on file  Food Insecurity: Not on file  Transportation Needs: Not on file  Physical Activity: Not on file  Stress: Not on file  Social Connections: Not on file  Intimate Partner Violence: Not on file    Outpatient Medications Prior to Visit  Medication Sig Dispense Refill   amLODipine (NORVASC) 5 MG tablet TAKE 1.5 TABLETS BY MOUTH DAILY. 135 tablet 0   betamethasone dipropionate (DIPROLENE) 0.05 % cream APPLY TO AFFECTED AREA TWICE A DAY 30 g 1   EPINEPHrine 0.3 mg/0.3 mL IJ SOAJ injection SMARTSIG:0.3 Milligram(s) IM Once PRN     escitalopram (LEXAPRO) 10 MG tablet Take 0.5 tablets (5 mg total) by mouth daily. 90 tablet 1   fluticasone (FLONASE) 50 MCG/ACT nasal spray SPRAY 1-2 SPRAYS IN EACH NOSTRIL ONCE A DAY     levocetirizine (XYZAL) 5 MG tablet TAKE 1 TABLET (5 MG TOTAL) BY MOUTH 2 (TWO) TIMES DAILY AS NEEDED FOR ALLERGIES. 60 tablet 5   levonorgestrel (MIRENA) 20 MCG/24HR IUD 1 each by Intrauterine route once.     meloxicam (MOBIC) 7.5 MG tablet Take 1 tablet (7.5 mg  total) by mouth daily. 14 tablet 0   pantoprazole (PROTONIX) 40 MG tablet TAKE 1 TABLET BY MOUTH TWICE A DAY 180 tablet 1   SYMBICORT 160-4.5 MCG/ACT inhaler Inhale 2 puffs into the lungs in the morning and at bedtime.     Facility-Administered Medications Prior to Visit  Medication Dose Route Frequency Provider Last Rate Last Admin   0.9 %  sodium chloride infusion  500 mL Intravenous Once Irene Shipper, MD        No Known Allergies  ROS     Objective:    Physical Exam Constitutional:      Appearance: Normal appearance.  HENT:     Head: Normocephalic and atraumatic.     Right Ear: Tympanic membrane, ear canal and external ear normal.     Left Ear: Tympanic membrane, ear canal and external ear normal.  Eyes:     Extraocular Movements: Extraocular movements intact.     Pupils: Pupils are equal, round, and reactive to light.  Cardiovascular:     Rate and Rhythm: Normal rate and regular rhythm.     Heart sounds: Normal heart sounds. No murmur heard.    No gallop.  Pulmonary:     Effort: Pulmonary effort is normal. No respiratory distress.     Breath sounds: Normal breath sounds. No wheezing or rales.  Skin:    General: Skin is warm and dry.  Neurological:     General: No focal deficit present.     Mental Status: She is alert and oriented to person, place, and time.  Psychiatric:        Mood and Affect: Mood normal.        Behavior: Behavior normal.     There were no vitals taken for this visit. Wt Readings from Last 3 Encounters:  06/17/22 212 lb (96.2 kg)  05/10/22 217 lb (98.4 kg)  02/25/22 216 lb (98 kg)    Diabetic Foot Exam - Simple   No data filed    Lab Results  Component Value Date   WBC 7.7 05/11/2022   HGB 15.3 (H) 05/11/2022   HCT 44.1 05/11/2022   PLT 227.0 05/11/2022   GLUCOSE 98 05/11/2022   CHOL 156 05/11/2022   TRIG 127.0 05/11/2022   HDL 43.10  05/11/2022   LDLDIRECT 86.0 01/15/2019   LDLCALC 87 05/11/2022   ALT 24 05/11/2022   AST 21  05/11/2022   NA 140 05/11/2022   K 4.3 05/11/2022   CL 103 05/11/2022   CREATININE 1.03 05/11/2022   BUN 16 05/11/2022   CO2 30 05/11/2022   TSH 1.57 05/11/2022    Lab Results  Component Value Date   TSH 1.57 05/11/2022   Lab Results  Component Value Date   WBC 7.7 05/11/2022   HGB 15.3 (H) 05/11/2022   HCT 44.1 05/11/2022   MCV 87.6 05/11/2022   PLT 227.0 05/11/2022   Lab Results  Component Value Date   NA 140 05/11/2022   K 4.3 05/11/2022   CO2 30 05/11/2022   GLUCOSE 98 05/11/2022   BUN 16 05/11/2022   CREATININE 1.03 05/11/2022   BILITOT 0.7 05/11/2022   ALKPHOS 53 05/11/2022   AST 21 05/11/2022   ALT 24 05/11/2022   PROT 7.3 05/11/2022   ALBUMIN 4.3 05/11/2022   CALCIUM 9.4 05/11/2022   GFR 62.43 05/11/2022   Lab Results  Component Value Date   CHOL 156 05/11/2022   Lab Results  Component Value Date   HDL 43.10 05/11/2022   Lab Results  Component Value Date   LDLCALC 87 05/11/2022   Lab Results  Component Value Date   TRIG 127.0 05/11/2022   Lab Results  Component Value Date   CHOLHDL 4 05/11/2022   No results found for: "HGBA1C"     Assessment & Plan:   Problem List Items Addressed This Visit   None    No orders of the defined types were placed in this encounter.   Alphonzo Grieve, personally preformed the services described in this documentation.  All medical record entries made by the scribe were at my direction and in my presence.  I have reviewed the chart and discharge instructions (if applicable) and agree that the record reflects my personal performance and is accurate and complete. 08/26/2022.  I,Mathew Stumpf,acting as a Education administrator for [employer], NP.,have documented all relevant documentation on the behalf of Nance Pear, NP,as directed by  Nance Pear, NP while in the presence of Nance Pear, NP.   Brittany Johnson

## 2022-08-28 ENCOUNTER — Telehealth: Payer: Self-pay | Admitting: Family

## 2022-08-28 NOTE — Telephone Encounter (Signed)
Please send no show letter

## 2022-08-29 NOTE — Telephone Encounter (Signed)
Letter mailed

## 2022-09-08 ENCOUNTER — Ambulatory Visit: Payer: BC Managed Care – PPO | Admitting: Family

## 2022-09-11 ENCOUNTER — Other Ambulatory Visit: Payer: Self-pay | Admitting: Family

## 2022-09-12 ENCOUNTER — Encounter: Payer: Self-pay | Admitting: Family

## 2022-09-12 DIAGNOSIS — Z713 Dietary counseling and surveillance: Secondary | ICD-10-CM | POA: Diagnosis not present

## 2022-09-23 ENCOUNTER — Ambulatory Visit
Admission: RE | Admit: 2022-09-23 | Discharge: 2022-09-23 | Disposition: A | Discharge status: Home / Self Care | Payer: BC Managed Care – PPO | Source: Ambulatory Visit | Attending: Nurse Practitioner | Admitting: Nurse Practitioner

## 2022-09-23 DIAGNOSIS — K7400 Hepatic fibrosis, unspecified: Secondary | ICD-10-CM | POA: Diagnosis not present

## 2022-09-23 DIAGNOSIS — B181 Chronic viral hepatitis B without delta-agent: Secondary | ICD-10-CM

## 2022-09-23 DIAGNOSIS — K7402 Hepatic fibrosis, advanced fibrosis: Secondary | ICD-10-CM

## 2022-09-26 ENCOUNTER — Encounter: Payer: Self-pay | Admitting: *Deleted

## 2022-09-26 DIAGNOSIS — K760 Fatty (change of) liver, not elsewhere classified: Secondary | ICD-10-CM | POA: Diagnosis not present

## 2022-09-26 DIAGNOSIS — F411 Generalized anxiety disorder: Secondary | ICD-10-CM | POA: Diagnosis not present

## 2022-09-26 DIAGNOSIS — E669 Obesity, unspecified: Secondary | ICD-10-CM | POA: Diagnosis not present

## 2022-10-07 ENCOUNTER — Ambulatory Visit
Admission: RE | Admit: 2022-10-07 | Discharge: 2022-10-07 | Disposition: A | Discharge status: Home / Self Care | Payer: BC Managed Care – PPO | Source: Ambulatory Visit | Attending: Family | Admitting: Family

## 2022-10-07 DIAGNOSIS — Z1231 Encounter for screening mammogram for malignant neoplasm of breast: Secondary | ICD-10-CM | POA: Diagnosis not present

## 2022-11-15 DIAGNOSIS — M79641 Pain in right hand: Secondary | ICD-10-CM | POA: Diagnosis not present

## 2022-11-15 DIAGNOSIS — M654 Radial styloid tenosynovitis [de Quervain]: Secondary | ICD-10-CM | POA: Diagnosis not present

## 2022-11-20 ENCOUNTER — Other Ambulatory Visit: Payer: Self-pay | Admitting: Family

## 2022-11-23 DIAGNOSIS — Z713 Dietary counseling and surveillance: Secondary | ICD-10-CM | POA: Diagnosis not present

## 2022-11-23 DIAGNOSIS — E669 Obesity, unspecified: Secondary | ICD-10-CM | POA: Diagnosis not present

## 2022-12-13 ENCOUNTER — Encounter: Payer: Self-pay | Admitting: Obstetrics & Gynecology

## 2022-12-13 ENCOUNTER — Ambulatory Visit (INDEPENDENT_AMBULATORY_CARE_PROVIDER_SITE_OTHER): Payer: BC Managed Care – PPO | Admitting: Obstetrics & Gynecology

## 2022-12-13 ENCOUNTER — Other Ambulatory Visit (HOSPITAL_COMMUNITY)
Admission: RE | Admit: 2022-12-13 | Discharge: 2022-12-13 | Disposition: A | Discharge status: Home / Self Care | Payer: BC Managed Care – PPO | Source: Ambulatory Visit | Attending: Obstetrics & Gynecology | Admitting: Obstetrics & Gynecology

## 2022-12-13 VITALS — BP 118/80 | HR 80 | Ht 61.75 in | Wt 198.0 lb

## 2022-12-13 DIAGNOSIS — Z01419 Encounter for gynecological examination (general) (routine) without abnormal findings: Secondary | ICD-10-CM | POA: Insufficient documentation

## 2022-12-13 DIAGNOSIS — Z30431 Encounter for routine checking of intrauterine contraceptive device: Secondary | ICD-10-CM

## 2022-12-13 DIAGNOSIS — B181 Chronic viral hepatitis B without delta-agent: Secondary | ICD-10-CM | POA: Diagnosis not present

## 2022-12-13 NOTE — Progress Notes (Signed)
Brittany Johnson 1970-07-16 [id number]   History:    52 y.o.  [id number] Married   RP:  Established patient presenting for annual gyn exam    HPI: Contraception Mirena IUD x 2017.  No BTB x Pelvic US 06/2021.  FSH at 71.6 at that time. No pelvic pain.  No pain with IC, but not frequently sexually active.  Previous paps normal, last Pap Neg 02/2021. Pap reflex today.  Breasts normal.  Mammo 09/2022.  BMI 36.51. Improving fitness and nutrition at the [facility].  Health labs with Fam MD.  Continues with Hepatitis follow up as recommended.    Past medical history,surgical history, family history and social history were all reviewed and documented in the EPIC chart.  Gynecologic History No LMP recorded. Patient is perimenopausal.  Obstetric History OB History  Gravida Para Term Preterm AB Living  4 2     2 2   SAB IAB Ectopic Multiple Live Births  2       2    # Outcome Date GA Lbr Len/2nd Weight Sex Delivery Anes PTL Lv  4 SAB           3 SAB           2 Para     F Vag-Spont   LIV  1 Para     M Vag-Spont   LIV     ROS: A ROS was performed and pertinent positives and negatives are included in the history. GENERAL: No fevers or chills. HEENT: No change in vision, no earache, sore throat or sinus congestion. NECK: No pain or stiffness. CARDIOVASCULAR: No chest pain or pressure. No palpitations. PULMONARY: No shortness of breath, cough or wheeze. GASTROINTESTINAL: No abdominal pain, nausea, vomiting or diarrhea, melena or bright red blood per rectum. GENITOURINARY: No urinary frequency, urgency, hesitancy or dysuria. MUSCULOSKELETAL: No joint or muscle pain, no back pain, no recent trauma. DERMATOLOGIC: No rash, no itching, no lesions. ENDOCRINE: No polyuria, polydipsia, no heat or cold intolerance. No recent change in weight. HEMATOLOGICAL: No anemia or easy bruising or bleeding. NEUROLOGIC: No headache, seizures, numbness, tingling or weakness. PSYCHIATRIC: No depression, no  loss of interest in normal activity or change in sleep pattern.     Exam:   BP 118/80   Pulse 80   Ht 5' 1.75" (1.568 m)   Wt 198 lb (89.8 kg)   SpO2 99%   BMI 36.51 kg/m   Body mass index is 36.51 kg/m.  General appearance : Well developed well nourished female. No acute distress HEENT: Eyes: no retinal hemorrhage or exudates,  Neck supple, trachea midline, no carotid bruits, no thyroidmegaly Lungs: Clear to auscultation, no rhonchi or wheezes, or rib retractions  Heart: Regular rate and rhythm, no murmurs or gallops Breast:Examined in sitting and supine position were symmetrical in appearance, no palpable masses or tenderness,  no skin retraction, no nipple inversion, no nipple discharge, no skin discoloration, no axillary or supraclavicular lymphadenopathy Abdomen: no palpable masses or tenderness, no rebound or guarding Extremities: no edema or skin discoloration or tenderness  Pelvic: Vulva: Normal             Vagina: No gross lesions or discharge  Cervix: No gross lesions or discharge.  Pap reflex done.  Uterus  AV, normal size, shape and consistency, non-tender and mobile  Adnexa  Without masses or tenderness  Anus: Normal   Assessment/Plan:  52 y.o. female for annual exam   1. Encounter for routine gynecological  examination with Papanicolaou smear of cervix Contraception Mirena IUD x 2017.  No BTB x Pelvic US 06/2021.  FSH at 71.6 at that time. No pelvic pain.  No pain with IC, but not frequently sexually active.  Previous paps normal, last Pap Neg 02/2021. Pap reflex today.  Breasts normal.  Mammo 09/2022.  BMI 36.51. Improving fitness and nutrition at the [facility].  Health labs with Fam MD.  Continues with Hepatitis follow up as recommended.  - Cytology - PAP( [place])  2. Encounter for routine checking of intrauterine contraceptive device (IUD) Contraception Mirena IUD x 2017.  No BTB x Pelvic US 06/2021.  FSH at 71.6 at that time. No pelvic  pain.  No pain with IC, but not frequently sexually active.  Well on Mirena IUD with IUD in good location. Recommend to start using condoms in 06/2023 and have the IUD removed at next Annual Gyn exam.  3. Hepatitis B carrier (HCC)  Other orders - phentermine (ADIPEX-P) 37.5 MG tablet; Take 37.5 mg by mouth daily.   Genia Del MD, 9:33 AM

## 2022-12-19 DIAGNOSIS — E669 Obesity, unspecified: Secondary | ICD-10-CM | POA: Diagnosis not present

## 2022-12-19 DIAGNOSIS — K760 Fatty (change of) liver, not elsewhere classified: Secondary | ICD-10-CM | POA: Diagnosis not present

## 2022-12-19 LAB — CYTOLOGY - PAP
Comment: NEGATIVE
Diagnosis: UNDETERMINED — AB
High risk HPV: NEGATIVE

## 2023-01-03 ENCOUNTER — Other Ambulatory Visit: Payer: Self-pay

## 2023-01-03 DIAGNOSIS — R87610 Atypical squamous cells of undetermined significance on cytologic smear of cervix (ASC-US): Secondary | ICD-10-CM

## 2023-01-12 ENCOUNTER — Encounter: Payer: Self-pay | Admitting: Obstetrics and Gynecology

## 2023-01-12 ENCOUNTER — Ambulatory Visit (INDEPENDENT_AMBULATORY_CARE_PROVIDER_SITE_OTHER): Payer: BC Managed Care – PPO | Admitting: Obstetrics and Gynecology

## 2023-01-12 ENCOUNTER — Other Ambulatory Visit (HOSPITAL_COMMUNITY)
Admission: RE | Admit: 2023-01-12 | Discharge: 2023-01-12 | Disposition: A | Discharge status: Home / Self Care | Payer: BC Managed Care – PPO | Source: Ambulatory Visit | Attending: Obstetrics and Gynecology | Admitting: Obstetrics and Gynecology

## 2023-01-12 VITALS — BP 132/82 | HR 85

## 2023-01-12 DIAGNOSIS — N912 Amenorrhea, unspecified: Secondary | ICD-10-CM | POA: Diagnosis not present

## 2023-01-12 DIAGNOSIS — R87610 Atypical squamous cells of undetermined significance on cytologic smear of cervix (ASC-US): Secondary | ICD-10-CM

## 2023-01-12 DIAGNOSIS — N72 Inflammatory disease of cervix uteri: Secondary | ICD-10-CM | POA: Insufficient documentation

## 2023-01-12 NOTE — Patient Instructions (Signed)
Colposcopy, Care After  The following information offers guidance on how to care for yourself after your procedure. Your health care provider may also give you more specific instructions. If you have problems or questions, contact your health care provider. What can I expect after the procedure? If you had a colposcopy without a biopsy, you can expect to feel fine right away after your procedure. However, you may have some spotting of blood for a few days. You can return to your normal activities. If you had a colposcopy with a biopsy, it is common after the procedure to have: Soreness and mild pain. These may last for a few days. Mild vaginal bleeding or discharge that is dark-colored and grainy. This may last for a few days. The discharge may be caused by a liquid (solution) that was used during the procedure. You may need to wear a sanitary pad during this time. Spotting of blood for at least 48 hours after the procedure. Follow these instructions at home: Medicines Take over-the-counter and prescription medicines only as told by your health care provider. Talk with your health care provider about what type of over-the-counter pain medicines and prescription medicines you can start to take again. It is especially important to talk with your health care provider if you take blood thinners. Activity Avoid using douche products, using tampons, and having sex for at least 3 days after the procedure or for as long as told by your health care provider. Return to your normal activities as told by your health care provider. Ask your health care provider what activities are safe for you. General instructions Ask your health care provider if you may take baths, swim, or use a hot tub. You may take showers. If you use birth control (contraception), continue to use it. Keep all follow-up visits. This is important. Contact a health care provider if: You have a fever or chills. You faint or feel  light-headed. Get help right away if: You have heavy bleeding from your vagina or pass blood clots. Heavy bleeding is bleeding that soaks through a sanitary pad in less than 1 hour. You have vaginal discharge that is abnormal, is yellow in color, or smells bad. This could be a sign of infection. You have severe pain or cramps in your lower abdomen that do not go away with medicine. Summary If you had a colposcopy without a biopsy, you can expect to feel fine right away, but you may have some spotting of blood for a few days. You can return to your normal activities. If you had a colposcopy with a biopsy, it is common to have mild pain for a few days and spotting for 48 hours after the procedure. Avoid using douche products, using tampons, and having sex for at least 3 days after the procedure or for as long as told by your health care provider. Get help right away if you have heavy bleeding, severe pain, or signs of infection. This information is not intended to replace advice given to you by your health care provider. Make sure you discuss any questions you have with your health care provider. Document Revised: 10/25/2020 Document Reviewed: 10/25/2020 Elsevier Patient Education  2024 Elsevier Inc.  

## 2023-01-12 NOTE — Progress Notes (Signed)
GYNECOLOGY  VISIT   HPI: 52 y.o.   Married  Asian/Caucasian  female   [phone] with No LMP recorded. (Menstrual status: IUD).   here for a colposcopy due two second pap resulting with ASCUS, HR HPV-neg.  Has Mirena IUD.  No bleeding. 06/15/21 - FSH 71.6.  GYNECOLOGIC HISTORY: No LMP recorded. (Menstrual status: IUD). Contraception: Mirena IUD since 05/2016 Menopausal hormone therapy: none Last mammogram: 10/07/2022-neg birads 1; Cat B Last pap smear: 12/13/2022-ASCUS, HRHPV-neg, 03/03/2021-ASCUS, HRHPV-neg, 12/25/2018-WNL, HPV-neg        OB History     Gravida  4   Para  2   Term      Preterm      AB  2   Living  2      SAB  2   IAB      Ectopic      Multiple      Live Births  2              Patient Active Problem List   Diagnosis Date Noted   Advanced hepatic fibrosis 02/25/2022   Midline low back pain without sciatica 02/25/2022   Impingement of right shoulder 02/25/2022   Lateral epicondylitis of right elbow 09/29/2021   Urinary incontinence 03/31/2021   Depression with anxiety 03/02/2021   Upper airway cough syndrome 12/19/2017   Cough variant asthma vs UACS  12/19/2017   Psoriasis 10/06/2017   Onychomycosis 01/08/2016   Vitamin D deficiency 10/09/2015   Preventative health care 06/26/2015   Nonallopathic lesion-rib cage 09/12/2013   Menorrhagia 11/24/2012   Hypokalemia 05/25/2012   Eczema 09/02/2011   Left lateral epicondylitis 09/02/2011   Tinea pedis of right foot 03/07/2011   Morbid obesity (HCC) 03/04/2011   Chronic viral hepatitis B without delta agent and without coma ([facility]CC) 02/07/2011   Abnormal cervical cytology 02/07/2011   HTN (hypertension) 02/07/2011   GERD 08/14/2008   CHEST PAIN, ATYPICAL 08/14/2008    Past Medical History:  Diagnosis Date   Abnormal cervical cytology 02/07/2011   ?   Advanced hepatic fibrosis 02/25/2022   Allergy    Anxiety    Bronchitis 05/25/2012   Chicken pox as a child   Chronic viral hepatitis B  without delta agent and without coma ([facility]) 02/07/2011   Treated during pregnancy, never symptomatic   Fibroid    GERD (gastroesophageal reflux disease)    Hepatitis B    Hx: UTI (urinary tract infection) 02/07/2011   Hypertension    in the past   Hypokalemia 05/25/2012   Left lateral epicondylitis 09/02/2011   Menorrhagia 11/24/2012   Obese 03/04/2011   Palpitations 07/08/2012   in the past   Psoriasis 09/02/2011   Right shoulder strain 03/29/2012   Shingles    Tinea pedis of right foot 03/07/2011    Past Surgical History:  Procedure Laterality Date   DILATION AND CURETTAGE OF UTERUS  2003   SAB   EYE SURGERY     INTRAUTERINE DEVICE (IUD) INSERTION     mirena inserted 05-16-16    Current Outpatient Medications  Medication Sig Dispense Refill   betamethasone dipropionate (DIPROLENE) 0.05 % cream APPLY TO AFFECTED AREA TWICE A DAY 30 g 1   EPINEPHrine 0.3 mg/0.3 mL IJ SOAJ injection      escitalopram (LEXAPRO) 10 MG tablet Take 10 mg by mouth daily.     levonorgestrel (MIRENA) 20 MCG/24HR IUD 1 each by Intrauterine route once.     pantoprazole (PROTONIX) 40 MG tablet TAKE 1  TABLET BY MOUTH TWICE A DAY 180 tablet 1   phentermine (ADIPEX-P) 37.5 MG tablet Take 37.5 mg by mouth daily.     SYMBICORT 160-4.5 MCG/ACT inhaler Inhale 2 puffs into the lungs in the morning and at bedtime.     topiramate (TOPAMAX) 25 MG tablet Start with 1 tablet daily, combined with Phentermine; OK to increase to 2 tablets as tolerated.     No current facility-administered medications for this visit.     ALLERGIES: Patient has no known allergies.  Family History  Problem Relation Age of Onset   Diabetes Mother        type 2   Hypertension Mother    Hepatitis Mother        Hep B, died with liver issues   Heart attack Father        ? "infection on the outside of his heart"   Hypertension Father    Cancer Father        prostate   Cancer Paternal Grandmother        ?   Colon cancer Neg Hx    Colon  polyps Neg Hx    Esophageal cancer Neg Hx    Stomach cancer Neg Hx    Rectal cancer Neg Hx    Breast cancer Neg Hx     Social History   Socioeconomic History   Marital status: Married    Spouse name: Not on file   Number of children: Not on file   Years of education: Not on file   Highest education level: Not on file  Occupational History   Not on file  Tobacco Use   Smoking status: Never   Smokeless tobacco: Never  Vaping Use   Vaping status: Never Used  Substance and Sexual Activity   Alcohol use: No   Drug use: No   Sexual activity: Not Currently    Partners: Male    Birth control/protection: I.U.D.  Other Topics Concern   Not on file  Social History Narrative   Married for 23 yrs   2 step children- grown   2001 Son   2005 daughter   Works as an Research scientist (medical) at [employer]    Completed bachelor's degree   Enjoys outdoor activities, golf, reading   Advertising account planner   Social Determinants of Corporate investment banker Strain: Low Risk  (01/13/2022)   Received from [employer], [facility]   Overall Financial Resource Strain (CARDIA)    Difficulty of Paying Living Expenses: Not hard at all  Food Insecurity: No Food Insecurity (01/13/2022)   [facility]   Hunger Vital Sign    Worried About Running Out of Food in the Last Year: Never true    Ran Out of Food in the Last Year: Never true  Transportation Needs: No Transportation Needs (01/13/2022)   Received from [employer], [facility]   PRAPARE - Transportation    Lack of Transportation (Medical): No    Lack of Transportation (Non-Medical): No  Physical Activity: Not on file  Stress: Not on file  Social Connections: Unknown (04/29/2022)   Received from [facility], [facility]   Social Network    Social Network: Not on file  Intimate Partner Violence: Unknown (04/29/2022)   Received from [facility], [facility]   HITS    Physically Hurt: Not on file     Insult or Talk Down To: Not on file    Threaten Physical Harm: Not  on file    Scream or Curse: Not on file    Review of Systems  All other systems reviewed and are negative.   PHYSICAL EXAMINATION:    BP 132/82   Pulse 85   SpO2 97%     General appearance: alert, cooperative and appears stated age  Colposcopy - cervix, vagina. Consent for procedure.  3% acetic acid used in vagina and on vulva. White light and green light filter used.  Colposcopy satisfactory:  Yes   __x___          No    _____ Findings:    Cervix:  atrophy noted.  IUD strings present.  Vagina:  atrophy noted. Biopsies:  ECC.  Monsel's placed.  Minimal EBL. No complications.   Chaperone was present for exam:  Rosette Reveal, CMA  ASSESSMENT  ASCUS, neg HR HPV pap x 2.  Atrophy noted.  Menopausal by [facility].  PLAN  Atrophy discussed. ECC. Check FSH and E2.  Discussed IUD removal if labs confirm menopause.  Consider vaginal estrogen to treat atrophy.  Anticipate pap and HR HPV testing in one year.

## 2023-01-28 ENCOUNTER — Other Ambulatory Visit: Payer: Self-pay | Admitting: Internal Medicine

## 2023-02-16 DIAGNOSIS — G5601 Carpal tunnel syndrome, right upper limb: Secondary | ICD-10-CM | POA: Diagnosis not present

## 2023-02-16 DIAGNOSIS — M654 Radial styloid tenosynovitis [de Quervain]: Secondary | ICD-10-CM | POA: Diagnosis not present

## 2023-02-23 DIAGNOSIS — U071 COVID-19: Secondary | ICD-10-CM | POA: Diagnosis not present

## 2023-03-07 DIAGNOSIS — F419 Anxiety disorder, unspecified: Secondary | ICD-10-CM | POA: Diagnosis not present

## 2023-03-07 DIAGNOSIS — I10 Essential (primary) hypertension: Secondary | ICD-10-CM | POA: Diagnosis not present

## 2023-03-07 DIAGNOSIS — B181 Chronic viral hepatitis B without delta-agent: Secondary | ICD-10-CM | POA: Diagnosis not present

## 2023-03-07 DIAGNOSIS — Z13220 Encounter for screening for lipoid disorders: Secondary | ICD-10-CM | POA: Diagnosis not present

## 2023-03-27 DIAGNOSIS — K760 Fatty (change of) liver, not elsewhere classified: Secondary | ICD-10-CM | POA: Diagnosis not present

## 2023-03-27 DIAGNOSIS — E669 Obesity, unspecified: Secondary | ICD-10-CM | POA: Diagnosis not present

## 2023-04-12 DIAGNOSIS — R9389 Abnormal findings on diagnostic imaging of other specified body structures: Secondary | ICD-10-CM | POA: Diagnosis not present

## 2023-04-12 DIAGNOSIS — M79644 Pain in right finger(s): Secondary | ICD-10-CM | POA: Diagnosis not present

## 2023-04-12 DIAGNOSIS — R202 Paresthesia of skin: Secondary | ICD-10-CM | POA: Diagnosis not present

## 2023-04-13 ENCOUNTER — Other Ambulatory Visit: Payer: Self-pay | Admitting: Nurse Practitioner

## 2023-04-13 DIAGNOSIS — K7581 Nonalcoholic steatohepatitis (NASH): Secondary | ICD-10-CM | POA: Diagnosis not present

## 2023-04-13 DIAGNOSIS — K7402 Hepatic fibrosis, advanced fibrosis: Secondary | ICD-10-CM

## 2023-04-13 DIAGNOSIS — G5601 Carpal tunnel syndrome, right upper limb: Secondary | ICD-10-CM | POA: Diagnosis not present

## 2023-04-13 DIAGNOSIS — B181 Chronic viral hepatitis B without delta-agent: Secondary | ICD-10-CM

## 2023-04-13 DIAGNOSIS — M654 Radial styloid tenosynovitis [de Quervain]: Secondary | ICD-10-CM | POA: Diagnosis not present

## 2023-04-14 DIAGNOSIS — Z13220 Encounter for screening for lipoid disorders: Secondary | ICD-10-CM | POA: Diagnosis not present

## 2023-04-14 DIAGNOSIS — Z1159 Encounter for screening for other viral diseases: Secondary | ICD-10-CM | POA: Diagnosis not present

## 2023-04-14 DIAGNOSIS — F419 Anxiety disorder, unspecified: Secondary | ICD-10-CM | POA: Diagnosis not present

## 2023-04-14 DIAGNOSIS — I10 Essential (primary) hypertension: Secondary | ICD-10-CM | POA: Diagnosis not present

## 2023-04-14 DIAGNOSIS — Z114 Encounter for screening for human immunodeficiency virus [HIV]: Secondary | ICD-10-CM | POA: Diagnosis not present

## 2023-04-14 DIAGNOSIS — K7402 Hepatic fibrosis, advanced fibrosis: Secondary | ICD-10-CM | POA: Diagnosis not present

## 2023-04-14 DIAGNOSIS — B181 Chronic viral hepatitis B without delta-agent: Secondary | ICD-10-CM | POA: Diagnosis not present

## 2023-04-14 DIAGNOSIS — Z131 Encounter for screening for diabetes mellitus: Secondary | ICD-10-CM | POA: Diagnosis not present

## 2023-04-14 DIAGNOSIS — M654 Radial styloid tenosynovitis [de Quervain]: Secondary | ICD-10-CM | POA: Diagnosis not present

## 2023-04-14 DIAGNOSIS — K7581 Nonalcoholic steatohepatitis (NASH): Secondary | ICD-10-CM | POA: Diagnosis not present

## 2023-04-21 DIAGNOSIS — M25561 Pain in right knee: Secondary | ICD-10-CM | POA: Diagnosis not present

## 2023-04-24 ENCOUNTER — Ambulatory Visit
Admission: RE | Admit: 2023-04-24 | Discharge: 2023-04-24 | Disposition: A | Discharge status: Home / Self Care | Payer: BC Managed Care – PPO | Source: Ambulatory Visit | Attending: Nurse Practitioner | Admitting: Nurse Practitioner

## 2023-04-24 DIAGNOSIS — K7402 Hepatic fibrosis, advanced fibrosis: Secondary | ICD-10-CM

## 2023-04-24 DIAGNOSIS — B1910 Unspecified viral hepatitis B without hepatic coma: Secondary | ICD-10-CM | POA: Diagnosis not present

## 2023-04-24 DIAGNOSIS — B181 Chronic viral hepatitis B without delta-agent: Secondary | ICD-10-CM

## 2023-06-14 HISTORY — PX: WRIST SURGERY: SHX841

## 2023-07-25 ENCOUNTER — Other Ambulatory Visit: Payer: Self-pay | Admitting: Nurse Practitioner

## 2023-07-25 DIAGNOSIS — K7402 Hepatic fibrosis, advanced fibrosis: Secondary | ICD-10-CM

## 2023-07-25 DIAGNOSIS — K7581 Nonalcoholic steatohepatitis (NASH): Secondary | ICD-10-CM

## 2023-07-25 DIAGNOSIS — B181 Chronic viral hepatitis B without delta-agent: Secondary | ICD-10-CM

## 2023-07-28 ENCOUNTER — Ambulatory Visit
Admission: RE | Admit: 2023-07-28 | Discharge: 2023-07-28 | Disposition: A | Discharge status: Home / Self Care | Payer: BC Managed Care – PPO | Source: Ambulatory Visit | Attending: Nurse Practitioner | Admitting: Nurse Practitioner

## 2023-07-28 DIAGNOSIS — K7402 Hepatic fibrosis, advanced fibrosis: Secondary | ICD-10-CM

## 2023-07-28 DIAGNOSIS — K7581 Nonalcoholic steatohepatitis (NASH): Secondary | ICD-10-CM

## 2023-07-28 DIAGNOSIS — B181 Chronic viral hepatitis B without delta-agent: Secondary | ICD-10-CM

## 2023-09-06 ENCOUNTER — Encounter (HOSPITAL_BASED_OUTPATIENT_CLINIC_OR_DEPARTMENT_OTHER): Payer: Self-pay | Admitting: Orthopaedic Surgery

## 2023-09-06 ENCOUNTER — Other Ambulatory Visit: Payer: Self-pay

## 2023-09-08 ENCOUNTER — Encounter (HOSPITAL_BASED_OUTPATIENT_CLINIC_OR_DEPARTMENT_OTHER)
Admission: RE | Admit: 2023-09-08 | Discharge: 2023-09-08 | Disposition: A | Discharge status: Home / Self Care | Source: Ambulatory Visit | Attending: Orthopaedic Surgery | Admitting: Orthopaedic Surgery

## 2023-09-08 DIAGNOSIS — I10 Essential (primary) hypertension: Secondary | ICD-10-CM | POA: Diagnosis not present

## 2023-09-08 DIAGNOSIS — Z01810 Encounter for preprocedural cardiovascular examination: Secondary | ICD-10-CM | POA: Diagnosis not present

## 2023-09-08 DIAGNOSIS — Z01818 Encounter for other preprocedural examination: Secondary | ICD-10-CM | POA: Diagnosis present

## 2023-09-11 NOTE — Discharge Instructions (Signed)
 Netta Cedars, MD [facility]  Please read the following information regarding your care after surgery.  Medications  You only need a prescription for the narcotic pain medicine (ex. oxycodone, Percocet, Norco).  All of the other medicines listed below are available over the counter. ? Aleve 2 pills twice a day for the first 3 days after surgery. ? acetominophen (Tylenol) 650 mg every 4-6 hours as you need for minor to moderate pain ? oxycodone as prescribed for severe pain  ? To help prevent blood clots, take aspirin (81 mg) twice daily for at least 42 days after surgery.  You should also get up every hour while you are awake to move around.  Weight Bearing ? Do NOT bear any weight on the operated leg or foot. This means do NOT touch your surgical leg to the ground!  Cast / Splint / Dressing ? If you have a boot or splint, do NOT remove this. Keep your splint, cast or dressing clean and dry.  Don't put anything (coat hanger, pencil, etc) down inside of it.  If it gets wet, call the office immediately to schedule an appointment for a cast change.  Swelling IMPORTANT: It is normal for you to have swelling where you had surgery. To reduce swelling and pain, keep at least 3 pillows under your leg so that your toes are above your nose and your heel is above the level of your hip.  It may be necessary to keep your foot or leg elevated for several weeks.  This is critical to helping your incisions heal and your pain to feel better.  Follow Up Call my office at [phone] when you are discharged from the hospital or surgery center to schedule an appointment to be seen 7-10 days after surgery.  Call my office at [phone] if you develop a fever >101.5 F, nausea, vomiting, bleeding from the surgical site or severe pain.

## 2023-09-11 NOTE — H&P (Addendum)
 ORTHOPAEDIC SURGERY H&P  Subjective:  The patient presents with left tibialis anterior tendon tearing.   Past Medical History:  Diagnosis Date   Abnormal cervical cytology 02/07/2011   ?   Advanced hepatic fibrosis 02/25/2022   Allergy    Anxiety    Bronchitis 05/25/2012   Chicken pox as a child   Chronic viral hepatitis B without delta agent and without coma (HCC) 02/07/2011   Treated during pregnancy, never symptomatic   Depression    Fibroid    GERD (gastroesophageal reflux disease)    Hepatitis B    Hx: UTI (urinary tract infection) 02/07/2011   Hypertension    in the past   Hypokalemia 05/25/2012   Left lateral epicondylitis 09/02/2011   Menorrhagia 11/24/2012   Obese 03/04/2011   Palpitations 07/08/2012   in the past   Psoriasis 09/02/2011   Right shoulder strain 03/29/2012   Shingles    Tinea pedis of right foot 03/07/2011    Past Surgical History:  Procedure Laterality Date   DILATION AND CURETTAGE OF UTERUS  2003   SAB   EYE SURGERY     INTRAUTERINE DEVICE (IUD) INSERTION     mirena inserted 05-16-16     (Not in an outpatient encounter)    No Known Allergies  Social History   Socioeconomic History   Marital status: Married    Spouse name: Not on file   Number of children: Not on file   Years of education: Not on file   Highest education level: Not on file  Occupational History   Not on file  Tobacco Use   Smoking status: Never   Smokeless tobacco: Never  Vaping Use   Vaping status: Never Used  Substance and Sexual Activity   Alcohol use: No   Drug use: No   Sexual activity: Not Currently    Partners: Male    Birth control/protection: I.U.D.  Other Topics Concern   Not on file  Social History Narrative   Married for 23 yrs   2 step children- grown   2001 Son   2005 daughter   Works as an Research scientist (medical) at [employer]    Completed bachelor's degree   Enjoys outdoor activities, golf, reading   Advertising account planner   Social  Drivers of Corporate investment banker Strain: Low Risk  (01/13/2022)   Received from [facility], [facility]   Overall Financial Resource Strain (CARDIA)    Difficulty of Paying Living Expenses: Not hard at all  Food Insecurity: Low Risk  (03/07/2023)   Received from [facility]   Hunger Vital Sign    Worried About Running Out of Food in the Last Year: Never true    Ran Out of Food in the Last Year: Never true  Transportation Needs: No Transportation Needs (03/07/2023)   Received from [facility]    In the past 12 months, has lack of reliable transportation kept you from medical appointments, meetings, work or from getting things needed for daily living? : No  Physical Activity: Not on file  Stress: Not on file  Social Connections: Unknown (04/29/2022)   Received from [facility], [facility]   Social Network    Social Network: Not on file  Intimate Partner Violence: Unknown (04/29/2022)   Received from [facility], [facility]   HITS    Physically Hurt: Not on file    Insult or Talk Down To: Not on file    Threaten Physical Harm:  Not on file    Scream or Curse: Not on file     History reviewed. No pertinent family history.   Review of Systems Pertinent items are noted in HPI.  Objective: Vital signs in last 24 hours:    09/06/2023   11:17 AM 01/12/2023    1:53 PM 12/13/2022    9:23 AM  Vitals with BMI  Height 5\' 2"   5' 1.75"  Weight 202 lbs  198 lbs  BMI 36.94  36.53  Systolic  132 118  Diastolic  82 80  Pulse  85 80      EXAM: General: Well nourished, well developed. Awake, alert and oriented to time, place, person. Normal mood and affect. No apparent distress. Breathing room air.  Operative Lower Extremity: Alignment - Neutral Deformity - None Skin intact Tenderness to palpation - left tib ant 5/5 PT, GS, Per, EHL, FHL Sensation intact to light touch throughout Palpable DP and PT pulses Special testing: None  The  contralateral foot/ankle was examined for comparison and noted to be neurovascularly intact with no localized deformity, swelling, or tenderness.  Imaging Review All images taken were independently reviewed by me.  Assessment/Plan: The clinical and radiographic findings were reviewed and discussed at length with the patient.  The patient has left tibialis anterior tendon tearing.  We spoke at length about the natural course of these findings. We discussed nonoperative and operative treatment options in detail.  The risks and benefits were presented and reviewed. The risks due to tendon rerupture, tendon transfer, hardware failure/irritation, new/persistent/recurrent infection, stiffness, nerve/vessel/tendon injury, nonunion/malunion of any fracture, wound healing issues, allograft usage, development of arthritis, failure of this surgery, possibility of external fixation in certain situations, possibility of delayed definitive surgery, need for further surgery, prolonged wound care including further soft tissue coverage procedures, thromboembolic events, anesthesia/medical complications/events perioperatively and beyond, amputation, death among others were discussed. The patient acknowledged the explanation and agreed to proceed with the plan.  Netta Cedars  Orthopaedic Surgery [facility]

## 2023-09-13 ENCOUNTER — Other Ambulatory Visit: Payer: Self-pay

## 2023-09-13 ENCOUNTER — Encounter (HOSPITAL_BASED_OUTPATIENT_CLINIC_OR_DEPARTMENT_OTHER): Payer: Self-pay | Admitting: Orthopaedic Surgery

## 2023-09-13 ENCOUNTER — Ambulatory Visit (HOSPITAL_BASED_OUTPATIENT_CLINIC_OR_DEPARTMENT_OTHER): Admitting: Anesthesiology

## 2023-09-13 ENCOUNTER — Ambulatory Visit (HOSPITAL_BASED_OUTPATIENT_CLINIC_OR_DEPARTMENT_OTHER)
Admission: RE | Admit: 2023-09-13 | Discharge: 2023-09-13 | Disposition: A | Discharge status: Home / Self Care | Attending: Orthopaedic Surgery | Admitting: Orthopaedic Surgery

## 2023-09-13 ENCOUNTER — Encounter (HOSPITAL_BASED_OUTPATIENT_CLINIC_OR_DEPARTMENT_OTHER)
Admission: RE | Disposition: A | Discharge status: Home / Self Care | Payer: Self-pay | Source: Home / Self Care | Attending: Orthopaedic Surgery

## 2023-09-13 DIAGNOSIS — Z79899 Other long term (current) drug therapy: Secondary | ICD-10-CM | POA: Insufficient documentation

## 2023-09-13 DIAGNOSIS — K219 Gastro-esophageal reflux disease without esophagitis: Secondary | ICD-10-CM | POA: Insufficient documentation

## 2023-09-13 DIAGNOSIS — M65872 Other synovitis and tenosynovitis, left ankle and foot: Secondary | ICD-10-CM | POA: Insufficient documentation

## 2023-09-13 DIAGNOSIS — I10 Essential (primary) hypertension: Secondary | ICD-10-CM | POA: Diagnosis not present

## 2023-09-13 DIAGNOSIS — M65879 Other synovitis and tenosynovitis, unspecified ankle and foot: Secondary | ICD-10-CM | POA: Diagnosis present

## 2023-09-13 HISTORY — PX: REPAIR EXTENSOR TENDON: SHX5382

## 2023-09-13 HISTORY — DX: Depression, unspecified: F32.A

## 2023-09-13 SURGERY — REPAIR, TENDON, EXTENSOR
Anesthesia: General | Site: Ankle | Laterality: Left

## 2023-09-13 MED ORDER — 0.9 % SODIUM CHLORIDE (POUR BTL) OPTIME
TOPICAL | Status: DC | PRN
  Administered 2023-09-13: 1000 mL

## 2023-09-13 MED ORDER — FENTANYL CITRATE (PF) 100 MCG/2ML IJ SOLN
INTRAMUSCULAR | Status: DC | PRN
  Administered 2023-09-13: 100 ug via INTRAVENOUS

## 2023-09-13 MED ORDER — BUPIVACAINE-EPINEPHRINE 0.5% -1:200000 IJ SOLN
INTRAMUSCULAR | Status: DC | PRN
  Administered 2023-09-13: 10 mL

## 2023-09-13 MED ORDER — CHLORHEXIDINE GLUCONATE 4 % EX SOLN: 60.0000 mL | Freq: Once | CUTANEOUS | Status: DC

## 2023-09-13 MED ORDER — CEFAZOLIN SODIUM-DEXTROSE 2-4 GM/100ML-% IV SOLN
2.0000 g | INTRAVENOUS | Status: AC
  Administered 2023-09-13: 2 g via INTRAVENOUS

## 2023-09-13 MED ORDER — OXYCODONE HCL 5 MG/5ML PO SOLN: 5.0000 mg | Freq: Once | ORAL | Status: AC | PRN

## 2023-09-13 MED ORDER — OXYCODONE HCL 5 MG PO TABS
5.0000 mg | ORAL_TABLET | Freq: Once | ORAL | Status: AC | PRN
  Administered 2023-09-13: 5 mg via ORAL

## 2023-09-13 MED ORDER — FENTANYL CITRATE (PF) 100 MCG/2ML IJ SOLN
INTRAMUSCULAR | Status: AC
  Filled 2023-09-13: qty 2

## 2023-09-13 MED ORDER — MIDAZOLAM HCL 5 MG/5ML IJ SOLN
INTRAMUSCULAR | Status: DC | PRN
  Administered 2023-09-13: 2 mg via INTRAVENOUS

## 2023-09-13 MED ORDER — LACTATED RINGERS IV SOLN: INTRAVENOUS | Status: DC

## 2023-09-13 MED ORDER — EPHEDRINE SULFATE-NACL 50-0.9 MG/10ML-% IV SOSY
PREFILLED_SYRINGE | INTRAVENOUS | Status: DC | PRN
  Administered 2023-09-13: 10 mg via INTRAVENOUS
  Administered 2023-09-13: 5 mg via INTRAVENOUS
  Administered 2023-09-13: 10 mg via INTRAVENOUS

## 2023-09-13 MED ORDER — AMISULPRIDE (ANTIEMETIC) 5 MG/2ML IV SOLN: 10.0000 mg | Freq: Once | INTRAVENOUS | Status: DC | PRN

## 2023-09-13 MED ORDER — POVIDONE-IODINE 10 % EX SOLN
CUTANEOUS | Status: DC | PRN
  Administered 2023-09-13: 1 via TOPICAL

## 2023-09-13 MED ORDER — MEPERIDINE HCL 25 MG/ML IJ SOLN: 6.2500 mg | INTRAMUSCULAR | Status: DC | PRN

## 2023-09-13 MED ORDER — HYDROMORPHONE HCL 1 MG/ML IJ SOLN
INTRAMUSCULAR | Status: AC
  Filled 2023-09-13: qty 0.5

## 2023-09-13 MED ORDER — EPHEDRINE 5 MG/ML INJ
INTRAVENOUS | Status: AC
  Filled 2023-09-13: qty 5

## 2023-09-13 MED ORDER — PROPOFOL 10 MG/ML IV BOLUS
INTRAVENOUS | Status: DC | PRN
  Administered 2023-09-13: 200 mg via INTRAVENOUS
  Administered 2023-09-13: 50 mg via INTRAVENOUS
  Administered 2023-09-13: 100 mg via INTRAVENOUS

## 2023-09-13 MED ORDER — MIDAZOLAM HCL 2 MG/2ML IJ SOLN
INTRAMUSCULAR | Status: AC
  Filled 2023-09-13: qty 2

## 2023-09-13 MED ORDER — HYDROMORPHONE HCL 1 MG/ML IJ SOLN
0.2500 mg | INTRAMUSCULAR | Status: DC | PRN
  Administered 2023-09-13 (×3): 0.5 mg via INTRAVENOUS

## 2023-09-13 MED ORDER — ONDANSETRON HCL 4 MG/2ML IJ SOLN
INTRAMUSCULAR | Status: DC | PRN
  Administered 2023-09-13: 4 mg via INTRAVENOUS

## 2023-09-13 MED ORDER — SODIUM CHLORIDE 0.9 % IV SOLN: 12.5000 mg | INTRAVENOUS | Status: DC | PRN

## 2023-09-13 MED ORDER — LIDOCAINE 2% (20 MG/ML) 5 ML SYRINGE
INTRAMUSCULAR | Status: DC | PRN
  Administered 2023-09-13: 60 mg via INTRAVENOUS

## 2023-09-13 MED ORDER — PROPOFOL 10 MG/ML IV BOLUS
INTRAVENOUS | Status: AC
  Filled 2023-09-13: qty 20

## 2023-09-13 MED ORDER — BUPIVACAINE-EPINEPHRINE (PF) 0.5% -1:200000 IJ SOLN
INTRAMUSCULAR | Status: AC
  Filled 2023-09-13: qty 30

## 2023-09-13 MED ORDER — CEFAZOLIN SODIUM-DEXTROSE 2-4 GM/100ML-% IV SOLN
INTRAVENOUS | Status: AC
  Filled 2023-09-13: qty 100

## 2023-09-13 MED ORDER — VANCOMYCIN HCL 500 MG IV SOLR
INTRAVENOUS | Status: DC | PRN
  Administered 2023-09-13: 500 mg

## 2023-09-13 MED ORDER — DEXAMETHASONE SODIUM PHOSPHATE 10 MG/ML IJ SOLN
INTRAMUSCULAR | Status: DC | PRN
  Administered 2023-09-13: 10 mg via INTRAVENOUS

## 2023-09-13 MED ORDER — OXYCODONE HCL 5 MG PO TABS
ORAL_TABLET | ORAL | Status: AC
Start: 2023-09-13 — End: ?
  Filled 2023-09-13: qty 1

## 2023-09-13 SURGICAL SUPPLY — 63 items
BANDAGE ESMARK 6X9 LF (GAUZE/BANDAGES/DRESSINGS) IMPLANT
BLADE AVERAGE 25X9 (BLADE) IMPLANT
BLADE SURG 15 STRL LF DISP TIS (BLADE) ×2 IMPLANT
BNDG COHESIVE 4X5 TAN STRL LF (GAUZE/BANDAGES/DRESSINGS) ×1 IMPLANT
BNDG ELASTIC 4INX 5YD STR LF (GAUZE/BANDAGES/DRESSINGS) IMPLANT
BNDG ELASTIC 6X10 VLCR STRL LF (GAUZE/BANDAGES/DRESSINGS) ×1 IMPLANT
BNDG ESMARK 6X9 LF (GAUZE/BANDAGES/DRESSINGS) IMPLANT
BNDG GAUZE DERMACEA FLUFF 4 (GAUZE/BANDAGES/DRESSINGS) ×1 IMPLANT
CHLORAPREP W/TINT 26 (MISCELLANEOUS) ×1 IMPLANT
COVER BACK TABLE 60X90IN (DRAPES) ×1 IMPLANT
CUFF TRNQT CYL 34X4.125X (TOURNIQUET CUFF) IMPLANT
DRAPE EXTREMITY T 121X128X90 (DISPOSABLE) ×1 IMPLANT
DRAPE IMP U-DRAPE 54X76 (DRAPES) ×1 IMPLANT
DRAPE OEC MINIVIEW 54X84 (DRAPES) IMPLANT
DRAPE U-SHAPE 47X51 STRL (DRAPES) ×1 IMPLANT
DRSG MEPITEL 4X7.2 (GAUZE/BANDAGES/DRESSINGS) ×1 IMPLANT
ELECT REM PT RETURN 9FT ADLT (ELECTROSURGICAL) ×1 IMPLANT
ELECTRODE REM PT RTRN 9FT ADLT (ELECTROSURGICAL) ×1 IMPLANT
GAUZE PAD ABD 8X10 STRL (GAUZE/BANDAGES/DRESSINGS) ×3 IMPLANT
GAUZE SPONGE 4X4 12PLY STRL (GAUZE/BANDAGES/DRESSINGS) ×1 IMPLANT
GAUZE SPONGE 4X4 12PLY STRL LF (GAUZE/BANDAGES/DRESSINGS) IMPLANT
GLOVE BIOGEL PI IND STRL 8 (GLOVE) ×2 IMPLANT
GLOVE SURG SS PI 7.5 STRL IVOR (GLOVE) ×2 IMPLANT
GOWN STRL REUS W/ TWL LRG LVL3 (GOWN DISPOSABLE) ×2 IMPLANT
KIT FIBERTAK DX 1.6 DISP (KITS) IMPLANT
NDL HYPO 22X1.5 SAFETY MO (MISCELLANEOUS) IMPLANT
NDL HYPO 25X1 1.5 SAFETY (NEEDLE) IMPLANT
NDL SAFETY ECLIPSE 18X1.5 (NEEDLE) ×1 IMPLANT
NDL SUT 6 .5 CRC .975X.05 MAYO (NEEDLE) IMPLANT
NEEDLE HYPO 22X1.5 SAFETY MO (MISCELLANEOUS) IMPLANT
NEEDLE HYPO 25X1 1.5 SAFETY (NEEDLE) IMPLANT
NS IRRIG 1000ML POUR BTL (IV SOLUTION) ×1 IMPLANT
PACK BASIN DAY SURGERY FS (CUSTOM PROCEDURE TRAY) ×1 IMPLANT
PAD CAST 4YDX4 CTTN HI CHSV (CAST SUPPLIES) ×1 IMPLANT
PADDING CAST SYNTHETIC 4X4 STR (CAST SUPPLIES) ×2 IMPLANT
PADDING CAST SYNTHETIC 6X4 NS (CAST SUPPLIES) ×3 IMPLANT
PASSER SUT SWANSON 36MM LOOP (INSTRUMENTS) IMPLANT
PENCIL SMOKE EVACUATOR (MISCELLANEOUS) ×1 IMPLANT
RETRIEVER SUT HEWSON (MISCELLANEOUS) IMPLANT
SANITIZER HAND PURELL FF 515ML (MISCELLANEOUS) ×1 IMPLANT
SET IRRIG Y TYPE TUR BLADDER L (SET/KITS/TRAYS/PACK) IMPLANT
SHAVER SABRE 2.0 (BURR) IMPLANT
SHEET MEDIUM DRAPE 40X70 STRL (DRAPES) ×1 IMPLANT
SLEEVE SCD COMPRESS KNEE MED (STOCKING) ×1 IMPLANT
SPIKE FLUID TRANSFER (MISCELLANEOUS) IMPLANT
SPLINT FIBERGLASS 4X30 (CAST SUPPLIES) ×1 IMPLANT
SPONGE T-LAP 18X18 ~~LOC~~+RFID (SPONGE) ×1 IMPLANT
STAPLER SKIN PROX WIDE 3.9 (STAPLE) IMPLANT
STOCKINETTE 6 STRL (DRAPES) ×1 IMPLANT
SUCTION TUBE FRAZIER 10FR DISP (SUCTIONS) IMPLANT
SUT ETHIBOND 2 OS 4 DA (SUTURE) IMPLANT
SUT ETHILON 2 0 FS 18 (SUTURE) ×1 IMPLANT
SUT FIBERWIRE 2-0 18 17.9 3/8 (SUTURE) IMPLANT
SUT MNCRL AB 3-0 PS2 18 (SUTURE) IMPLANT
SUT VIC AB 0 CT1 27XBRD ANBCTR (SUTURE) IMPLANT
SUT VIC AB 2-0 CT1 TAPERPNT 27 (SUTURE) IMPLANT
SUT VICRYL 0 SH 27 (SUTURE) IMPLANT
SUTURE FIBERWR 2-0 18 17.9 3/8 (SUTURE) IMPLANT
SYR 20ML LL LF (SYRINGE) ×1 IMPLANT
SYR BULB EAR ULCER 3OZ GRN STR (SYRINGE) ×1 IMPLANT
TOWEL GREEN STERILE FF (TOWEL DISPOSABLE) ×2 IMPLANT
TUBE CONNECTING 20X1/4 (TUBING) IMPLANT
UNDERPAD 30X36 HEAVY ABSORB (UNDERPADS AND DIAPERS) ×1 IMPLANT

## 2023-09-13 NOTE — Op Note (Signed)
 09/13/2023  2:23 PM   PATIENT: Brittany Johnson  53 y.o. female  MRN: [id number]   PRE-OPERATIVE DIAGNOSIS:   Left tibialis anterior tendinopathy with acute rupture   POST-OPERATIVE DIAGNOSIS:   Same   PROCEDURE: Open primary repair of left tibialis anterior tendon rupture   SURGEON:  Netta Cedars, MD   ASSISTANT: None   ANESTHESIA: General, regional   EBL: Minimal   TOURNIQUET:    Total Tourniquet Time Documented: Thigh (Left) - 17 minutes Total: Thigh (Left) - 17 minutes    COMPLICATIONS: None apparent   DISPOSITION: Extubated, awake and stable to recovery.   INDICATION FOR PROCEDURE: The patient presented with above diagnosis.  We discussed the diagnosis, alternative treatment options, risks and benefits of the above surgical intervention, as well as alternative non-operative treatments. All questions/concerns were addressed and the patient/family demonstrated appropriate understanding of the diagnosis, the procedure, the postoperative course, and overall prognosis. The patient wished to proceed with surgical intervention and signed an informed surgical consent as such, in each others presence prior to surgery.   PROCEDURE IN DETAIL: After preoperative consent was obtained and the correct operative site was identified, the patient was brought to the operating room supine on stretcher and transferred onto operating table. General anesthesia was induced. Preoperative antibiotics were administered. Surgical timeout was taken. The patient was then positioned supine with an ipsilateral hip bump. The operative lower extremity was prepped and draped in standard sterile fashion with a tourniquet around the thigh. The extremity was exsanguinated and the tourniquet was inflated to 275 mmHg.  We began by making an anterior ankle approach directly over the rupture site of the tibialis anterior tendon. The extensor retinaculum was incised and the tibialis anterior  tendon sheath was entered. Copious fluid was encountered as well as extensive tenosynovitis that was debrided. The split longitudinal tear was identified and this was primarily repaired using Vicryl suture. The tendon was noted to have intact excursion throughout without bowstringing. The extensor retinaculum was repaired.  The surgical sites were thoroughly irrigated. The tourniquet was deflated and hemostasis achieved. Betadine and vancomycin powder were applied. The deep layers were closed using 2-0 vicryl. The skin was closed without tension.    The leg was cleaned with saline and sterile dressings with gauze were applied. A well padded CAM boot was applied. The patient was awakened from anesthesia and transported to the recovery room in stable condition.    FOLLOW UP PLAN: -transfer to PACU, then home -strict TDWB in boot operative extremity, maximum elevation -maintain dressings until follow up -DVT ppx: Aspirin 81 mg twice daily while NWB -follow up as outpatient within 7-10 days for wound check -sutures out in 2-3 weeks in outpatient office   RADIOGRAPHS: None   Netta Cedars Orthopaedic Surgery [facility]

## 2023-09-13 NOTE — Anesthesia Postprocedure Evaluation (Signed)
 Anesthesia Post Note  Patient: Kenda M Meditz  Procedure(s) Performed: REPAIR, TENDON, EXTENSOR (Left: Ankle)     Patient location during evaluation: PACU Anesthesia Type: General Level of consciousness: awake and alert Pain management: pain level controlled Vital Signs Assessment: post-procedure vital signs reviewed and stable Respiratory status: spontaneous breathing, nonlabored ventilation and respiratory function stable Cardiovascular status: blood pressure returned to baseline and stable Postop Assessment: no apparent nausea or vomiting Anesthetic complications: no   No notable events documented.  Last Vitals:  Vitals:   09/13/23 1500 09/13/23 1533  BP: (!) 108/52 116/65  Pulse: 92 85  Resp: (!) 21   Temp:  36.6 C  SpO2: 96% 95%    Last Pain:  Vitals:   09/13/23 1533  TempSrc: Temporal  PainSc: 3                  Lowella Curb

## 2023-09-13 NOTE — Transfer of Care (Signed)
 Immediate Anesthesia Transfer of Care Note  Patient: Brittany Johnson  Procedure(s) Performed: REPAIR, TENDON, EXTENSOR (Left: Ankle)  Patient Location: PACU  Anesthesia Type:General  Level of Consciousness: drowsy  Airway & Oxygen Therapy: Patient Spontanous Breathing and Patient connected to face mask oxygen  Post-op Assessment: Report given to RN and Post -op Vital signs reviewed and stable  Post vital signs: Reviewed and stable  Last Vitals:  Vitals Value Taken Time  BP 107/63 09/13/23 1405  Temp    Pulse 97 09/13/23 1407  Resp 20 09/13/23 1407  SpO2 98 % 09/13/23 1407  Vitals shown include unfiled device data.  Last Pain:  Vitals:   09/13/23 1110  TempSrc: Temporal  PainSc: 0-No pain         Complications: No notable events documented.

## 2023-09-13 NOTE — H&P (Signed)
 H&P Update:  -History and Physical Reviewed  -Patient has been re-examined  -No change in the plan of care  -The risks and benefits were presented and reviewed. The risks due to tendon rerupture, tendon transfer, hardware/suture failure and/or irritation, new/persistent infection, stiffness, nerve/vessel/tendon injury or rerupture of repaired tendon, nonunion/malunion, allograft usage, wound healing issues, development of arthritis, failure of this surgery, possibility of external fixation with delayed definitive surgery, need for further surgery, thromboembolic events, anesthesia/medical complications, amputation, death among others were discussed. The patient acknowledged the explanation, agreed to proceed with the plan and a consent was signed.  Brittany Johnson

## 2023-09-13 NOTE — Anesthesia Procedure Notes (Signed)
 Procedure Name: LMA Insertion Date/Time: 09/13/2023 1:29 PM  Performed by: Roosvelt Harps, CRNAPre-anesthesia Checklist: Patient identified, Emergency Drugs available, Suction available and Patient being monitored Patient Re-evaluated:Patient Re-evaluated prior to induction Oxygen Delivery Method: Circle System Utilized Preoxygenation: Pre-oxygenation with 100% oxygen Induction Type: IV induction Ventilation: Mask ventilation without difficulty LMA: LMA inserted LMA Size: 4.0 Number of attempts: 1 Airway Equipment and Method: Bite block Placement Confirmation: positive ETCO2 Tube secured with: Tape Dental Injury: Teeth and Oropharynx as per pre-operative assessment

## 2023-09-13 NOTE — Anesthesia Preprocedure Evaluation (Signed)
 Anesthesia Evaluation  Patient identified by MRN, date of birth, ID band Patient awake    Reviewed: Allergy & Precautions, H&P , NPO status , Patient's Chart, lab work & pertinent test results  Airway Mallampati: II  TM Distance: >3 FB Neck ROM: Full    Dental no notable dental hx.    Pulmonary asthma    Pulmonary exam normal breath sounds clear to auscultation       Cardiovascular hypertension, Pt. on medications negative cardio ROS Normal cardiovascular exam Rhythm:Regular Rate:Normal     Neuro/Psych   Anxiety Depression    negative neurological ROS  negative psych ROS   GI/Hepatic Neg liver ROS,GERD  ,,  Endo/Other  negative endocrine ROS    Renal/GU negative Renal ROS  negative genitourinary   Musculoskeletal negative musculoskeletal ROS (+)    Abdominal  (+) + obese  Peds negative pediatric ROS (+)  Hematology negative hematology ROS (+)   Anesthesia Other Findings   Reproductive/Obstetrics negative OB ROS                             Anesthesia Physical Anesthesia Plan  ASA: 2  Anesthesia Plan: General   Post-op Pain Management: Regional block* and Minimal or no pain anticipated   Induction: Intravenous  PONV Risk Score and Plan: 3 and Ondansetron, Dexamethasone, Midazolam and Treatment may vary due to age or medical condition  Airway Management Planned: LMA  Additional Equipment:   Intra-op Plan:   Post-operative Plan: Extubation in OR  Informed Consent: I have reviewed the patients History and Physical, chart, labs and discussed the procedure including the risks, benefits and alternatives for the proposed anesthesia with the patient or authorized representative who has indicated his/her understanding and acceptance.     Dental advisory given  Plan Discussed with: CRNA  Anesthesia Plan Comments:        Anesthesia Quick Evaluation

## 2023-09-14 ENCOUNTER — Encounter (HOSPITAL_BASED_OUTPATIENT_CLINIC_OR_DEPARTMENT_OTHER): Payer: Self-pay | Admitting: Orthopaedic Surgery

## 2023-09-15 ENCOUNTER — Other Ambulatory Visit: Payer: Self-pay | Admitting: Obstetrics and Gynecology

## 2023-09-15 DIAGNOSIS — Z1231 Encounter for screening mammogram for malignant neoplasm of breast: Secondary | ICD-10-CM

## 2023-10-12 ENCOUNTER — Ambulatory Visit
Admission: RE | Admit: 2023-10-12 | Discharge: 2023-10-12 | Disposition: A | Discharge status: Home / Self Care | Source: Ambulatory Visit

## 2023-10-12 DIAGNOSIS — Z1231 Encounter for screening mammogram for malignant neoplasm of breast: Secondary | ICD-10-CM

## 2023-10-17 ENCOUNTER — Encounter: Payer: Self-pay | Admitting: Obstetrics and Gynecology

## 2023-12-28 ENCOUNTER — Other Ambulatory Visit: Payer: Self-pay | Admitting: Internal Medicine

## 2024-01-30 ENCOUNTER — Other Ambulatory Visit: Payer: Self-pay | Admitting: Nurse Practitioner

## 2024-01-30 DIAGNOSIS — B181 Chronic viral hepatitis B without delta-agent: Secondary | ICD-10-CM

## 2024-01-30 DIAGNOSIS — K7402 Hepatic fibrosis, advanced fibrosis: Secondary | ICD-10-CM

## 2024-02-02 ENCOUNTER — Other Ambulatory Visit

## 2024-02-05 ENCOUNTER — Ambulatory Visit
Admission: RE | Admit: 2024-02-05 | Discharge: 2024-02-05 | Disposition: A | Discharge status: Home / Self Care | Source: Ambulatory Visit | Attending: Nurse Practitioner | Admitting: Nurse Practitioner

## 2024-02-05 DIAGNOSIS — B181 Chronic viral hepatitis B without delta-agent: Secondary | ICD-10-CM

## 2024-02-05 DIAGNOSIS — K7402 Hepatic fibrosis, advanced fibrosis: Secondary | ICD-10-CM

## 2024-03-19 ENCOUNTER — Ambulatory Visit: Admitting: Obstetrics and Gynecology

## 2024-03-19 ENCOUNTER — Encounter: Payer: Self-pay | Admitting: Obstetrics and Gynecology

## 2024-03-19 ENCOUNTER — Other Ambulatory Visit (HOSPITAL_COMMUNITY)
Admission: RE | Admit: 2024-03-19 | Discharge: 2024-03-19 | Disposition: A | Discharge status: Home / Self Care | Source: Ambulatory Visit | Attending: Obstetrics and Gynecology | Admitting: Obstetrics and Gynecology

## 2024-03-19 VITALS — BP 126/82 | HR 77 | Ht 63.25 in | Wt 194.0 lb

## 2024-03-19 DIAGNOSIS — Z30432 Encounter for removal of intrauterine contraceptive device: Secondary | ICD-10-CM | POA: Diagnosis not present

## 2024-03-19 DIAGNOSIS — Z1331 Encounter for screening for depression: Secondary | ICD-10-CM | POA: Diagnosis not present

## 2024-03-19 DIAGNOSIS — Z8742 Personal history of other diseases of the female genital tract: Secondary | ICD-10-CM | POA: Diagnosis present

## 2024-03-19 DIAGNOSIS — Z01419 Encounter for gynecological examination (general) (routine) without abnormal findings: Secondary | ICD-10-CM | POA: Diagnosis not present

## 2024-03-19 DIAGNOSIS — Z124 Encounter for screening for malignant neoplasm of cervix: Secondary | ICD-10-CM

## 2024-03-19 NOTE — Progress Notes (Signed)
 53 y.o. [id number] Married Caucasian/Asian female here for annual exam and IUD removal.    Hx recurrent ASCUS paps. Colposcopy 01/2023:  ECC benign.   Hx HSV II.  New Rx for Valtrex.    PCP: Jhon Elveria LABOR, MD  Hepatologist - Dawn Drasic   No LMP recorded. (Menstrual status: IUD).           Sexually active: No.  The current method of family planning is IUD Mirena  05/2016.    Menopausal hormone therapy:  n/a Exercising: No.   Smoker:  no  OB History  Gravida Para Term Preterm AB Living  4 2   2 2   SAB IAB Ectopic Multiple Live Births  2    2    # Outcome Date GA Lbr Len/2nd Weight Sex Type Anes PTL Lv  4 SAB           3 SAB           2 Para     F Vag-Spont   LIV  1 Para     M Vag-Spont   LIV     HEALTH MAINTENANCE: Last 2 paps:  12/13/22 ASCUS HR HPV neg, 03/03/21 ASCUS, HR HPV neg History of abnormal Pap or positive HPV:  yes Mammogram:   10/12/23 Breast Density Cat B, BIRADS Cat 1 neg  Colonoscopy:  10/25/19 - due in 2031 - [place].   Bone Density:  n/a  Result  n/a   Immunization History  Administered Date(s) Administered   Influenza Split 03/04/2011, 03/29/2012   Influenza,inj,Quad PF,6+ Mos 02/26/2013, 03/31/2014, 06/26/2015, 07/12/2017, 04/13/2018, 03/02/2021, 02/25/2022   Influenza,trivalent, recombinat, inj, PF 03/22/2010   Td 06/13/2006, 03/31/2021   Tdap 03/04/2011   Zoster Recombinant(Shingrix) 03/31/2021, 06/03/2021      reports that she has never smoked. She has never used smokeless tobacco. She reports that she does not drink alcohol and does not use drugs.  Past Medical History:  Diagnosis Date   Abnormal cervical cytology 02/07/2011   ?   Advanced hepatic fibrosis 02/25/2022   Allergy     Anxiety    Bronchitis 05/25/2012   Chicken pox as a child   Chronic viral hepatitis B without delta agent and without coma (HCC) 02/07/2011   Treated during pregnancy, never symptomatic   Depression    Fibroid    GERD (gastroesophageal reflux disease)     Hepatitis B    Hx: UTI (urinary tract infection) 02/07/2011   Hypertension    in the past   Hypokalemia 05/25/2012   Left lateral epicondylitis 09/02/2011   Menorrhagia 11/24/2012   Obese 03/04/2011   Palpitations 07/08/2012   in the past   Psoriasis 09/02/2011   Right shoulder strain 03/29/2012   Shingles    Tinea pedis of right foot 03/07/2011    Past Surgical History:  Procedure Laterality Date   DILATION AND CURETTAGE OF UTERUS  2003   SAB   EYE SURGERY     INTRAUTERINE DEVICE (IUD) INSERTION     mirena  inserted 05-16-16   REPAIR EXTENSOR TENDON Left 09/13/2023   Procedure: REPAIR, TENDON, EXTENSOR;  Surgeon: Barton Drape, MD;  Location: [facility];  Service: Orthopedics;  Laterality: Left;  left tibialis anterior tendon repair,   WRIST SURGERY  06/2023    Current Outpatient Medications  Medication Sig Dispense Refill   amLODipine  (NORVASC ) 5 MG tablet Take 5 mg by mouth daily.     betamethasone  dipropionate (DIPROLENE ) 0.05 % cream APPLY TO AFFECTED AREA TWICE A  DAY 30 g 1   EPINEPHrine  0.3 mg/0.3 mL IJ SOAJ injection      escitalopram  (LEXAPRO ) 10 MG tablet Take 10 mg by mouth daily.     levonorgestrel  (MIRENA ) 20 MCG/24HR IUD 1 each by Intrauterine route once.     pantoprazole  (PROTONIX ) 40 MG tablet Take 1 tablet (40 mg total) by mouth 2 (two) times daily. Office visit for further refills 180 tablet 0   SYMBICORT  160-4.5 MCG/ACT inhaler Inhale 2 puffs into the lungs in the morning and at bedtime.     tenofovir alafenamide (VEMLIDY) 25 MG tablet Take 25 mg by mouth daily.     valACYclovir (VALTREX) 500 MG tablet Please take 1 tablet rally twice daily for 3 days with each outbreak     ZEPBOUND 7.5 MG/0.5ML Pen SMARTSIG:0.5 Milliliter(s) SUB-Q Once a Week     No current facility-administered medications for this visit.    ALLERGIES: Patient has no known allergies.  Family History  Problem Relation Age of Onset   Diabetes Mother        type 2    Hypertension Mother    Hepatitis Mother        Hep B, died with liver issues   Heart attack Father        ? infection on the outside of his heart   Hypertension Father    Cancer Father        prostate   Cancer Paternal Grandmother        ?   Colon cancer Neg Hx    Colon polyps Neg Hx    Esophageal cancer Neg Hx    Stomach cancer Neg Hx    Rectal cancer Neg Hx    Breast cancer Neg Hx     Review of Systems  All other systems reviewed and are negative.   PHYSICAL EXAM:  BP 126/82 (BP Location: Left Arm, Patient Position: Sitting)   Pulse 77   Ht 5' 3.25 (1.607 m)   Wt 194 lb (88 kg)   SpO2 98%   BMI 34.09 kg/m     General appearance: alert, cooperative and appears stated age Head: normocephalic, without obvious abnormality, atraumatic Neck: no adenopathy, supple, symmetrical, trachea midline and thyroid  normal to inspection and palpation Lungs: clear to auscultation bilaterally Breasts: normal appearance, no masses or tenderness, No nipple retraction or dimpling, No nipple discharge or bleeding, No axillary adenopathy Heart: regular rate and rhythm Abdomen: soft, non-tender; no masses, no organomegaly Extremities: extremities normal, atraumatic, no cyanosis or edema Skin: skin color, texture, turgor normal. No rashes or lesions Lymph nodes: cervical, supraclavicular, and axillary nodes normal. Neurologic: grossly normal  Pelvic: External genitalia:  no lesions              No abnormal inguinal nodes palpated.              Urethra:  normal appearing urethra with no masses, tenderness or lesions              Bartholins and Skenes: normal                 Vagina: normal appearing vagina with normal color and discharge, no lesions.  Atrophy.                Cervix: no lesions.  IUD strings noted.               Pap taken: yes Bimanual Exam:  Uterus:  normal size, contour, position, consistency, mobility, non-tender  Adnexa: no mass, fullness, tenderness               Rectal exam: yes.  Confirms.              Anus:  normal sphincter tone, no lesions  IUD removal.  Consent and time out done.  Ring forceps used to remove IUD intact, shown to patient and discarded.  No complications.   Chaperone was present for exam:  Kari HERO, CMA  ASSESSMENT: Well woman visit with gynecologic exam. Hx ASCUS paps. Normal colposcopy.  Cervical cancer screening.  Mirena  removal.  Menopausal by [facility].   PHQ-2-9: 0 Hx hep B.  On Vemlidy.   Hx HSV II. On Valtrex prn.   PLAN: Mammogram screening discussed. Self breast awareness reviewed. Pap and HRV collected:  yes Guidelines for Calcium, Vitamin D , regular exercise program including cardiovascular and weight bearing exercise. Medication refills:  NA Labs with PCP and hepatologist.  Follow up:  yearly and prn.

## 2024-03-19 NOTE — Patient Instructions (Signed)

## 2024-03-21 LAB — CYTOLOGY - PAP
Comment: NEGATIVE
Diagnosis: NEGATIVE
High risk HPV: NEGATIVE

## 2024-03-24 ENCOUNTER — Ambulatory Visit: Payer: Self-pay | Admitting: Obstetrics and Gynecology

## 2025-03-26 ENCOUNTER — Ambulatory Visit: Admitting: Obstetrics and Gynecology
